# Patient Record
Sex: Female | Born: 1965
Health system: Southern US, Community
[De-identification: ages and names within clinical notes are randomized; demographics above are authoritative.]

## PROBLEM LIST (undated history)

## (undated) DIAGNOSIS — K219 Gastro-esophageal reflux disease without esophagitis: Secondary | ICD-10-CM

## (undated) DIAGNOSIS — F419 Anxiety disorder, unspecified: Secondary | ICD-10-CM

## (undated) DIAGNOSIS — I10 Essential (primary) hypertension: Secondary | ICD-10-CM

## (undated) DIAGNOSIS — K589 Irritable bowel syndrome without diarrhea: Secondary | ICD-10-CM

## (undated) DIAGNOSIS — T7840XA Allergy, unspecified, initial encounter: Secondary | ICD-10-CM

## (undated) DIAGNOSIS — J449 Chronic obstructive pulmonary disease, unspecified: Secondary | ICD-10-CM

## (undated) DIAGNOSIS — M810 Age-related osteoporosis without current pathological fracture: Secondary | ICD-10-CM

## (undated) DIAGNOSIS — E039 Hypothyroidism, unspecified: Secondary | ICD-10-CM

## (undated) DIAGNOSIS — F17200 Nicotine dependence, unspecified, uncomplicated: Secondary | ICD-10-CM

## (undated) DIAGNOSIS — E785 Hyperlipidemia, unspecified: Secondary | ICD-10-CM

## (undated) DIAGNOSIS — R55 Syncope and collapse: Secondary | ICD-10-CM

## (undated) DIAGNOSIS — Z87442 Personal history of urinary calculi: Secondary | ICD-10-CM

## (undated) DIAGNOSIS — I2510 Atherosclerotic heart disease of native coronary artery without angina pectoris: Secondary | ICD-10-CM

## (undated) DIAGNOSIS — N189 Chronic kidney disease, unspecified: Secondary | ICD-10-CM

## (undated) DIAGNOSIS — R002 Palpitations: Secondary | ICD-10-CM

## (undated) DIAGNOSIS — M1990 Unspecified osteoarthritis, unspecified site: Secondary | ICD-10-CM

## (undated) DIAGNOSIS — E559 Vitamin D deficiency, unspecified: Secondary | ICD-10-CM

## (undated) DIAGNOSIS — Z8249 Family history of ischemic heart disease and other diseases of the circulatory system: Secondary | ICD-10-CM

## (undated) HISTORY — DX: Family history of ischemic heart disease and other diseases of the circulatory system: Z82.49

## (undated) HISTORY — DX: Hyperlipidemia, unspecified: E78.5

## (undated) HISTORY — DX: Atherosclerotic heart disease of native coronary artery without angina pectoris: I25.10

## (undated) HISTORY — DX: Gastro-esophageal reflux disease without esophagitis: K21.9

## (undated) HISTORY — DX: Allergy, unspecified, initial encounter: T78.40XA

## (undated) HISTORY — DX: Age-related osteoporosis without current pathological fracture: M81.0

## (undated) HISTORY — DX: Nicotine dependence, unspecified, uncomplicated: F17.200

## (undated) HISTORY — DX: Palpitations: R00.2

## (undated) HISTORY — DX: Irritable bowel syndrome, unspecified: K58.9

## (undated) HISTORY — DX: Essential (primary) hypertension: I10

## (undated) HISTORY — DX: Chronic kidney disease, unspecified: N18.9

## (undated) HISTORY — DX: Syncope and collapse: R55

## (undated) HISTORY — PX: WRIST SURGERY: SHX841

## (undated) HISTORY — DX: Vitamin D deficiency, unspecified: E55.9

## (undated) HISTORY — DX: Hypothyroidism, unspecified: E03.9

---

## 2002-05-21 ENCOUNTER — Encounter: Payer: Self-pay | Admitting: Emergency Medicine

## 2002-05-21 ENCOUNTER — Inpatient Hospital Stay (HOSPITAL_COMMUNITY): Admission: EM | Admit: 2002-05-21 | Discharge: 2002-05-22 | Payer: Self-pay | Admitting: Emergency Medicine

## 2004-10-18 ENCOUNTER — Other Ambulatory Visit: Admission: RE | Admit: 2004-10-18 | Discharge: 2004-10-18 | Payer: Self-pay | Admitting: Obstetrics and Gynecology

## 2005-04-27 ENCOUNTER — Inpatient Hospital Stay (HOSPITAL_COMMUNITY): Admission: AD | Admit: 2005-04-27 | Discharge: 2005-04-29 | Payer: Self-pay | Admitting: Obstetrics and Gynecology

## 2005-05-30 ENCOUNTER — Other Ambulatory Visit: Admission: RE | Admit: 2005-05-30 | Discharge: 2005-05-30 | Payer: Self-pay | Admitting: Obstetrics and Gynecology

## 2005-12-13 ENCOUNTER — Ambulatory Visit: Payer: Self-pay | Admitting: Family Medicine

## 2006-02-04 ENCOUNTER — Ambulatory Visit: Payer: Self-pay | Admitting: Family Medicine

## 2006-02-26 ENCOUNTER — Ambulatory Visit: Payer: Self-pay | Admitting: Family Medicine

## 2006-07-01 DIAGNOSIS — E039 Hypothyroidism, unspecified: Secondary | ICD-10-CM | POA: Insufficient documentation

## 2006-07-01 DIAGNOSIS — F419 Anxiety disorder, unspecified: Secondary | ICD-10-CM | POA: Insufficient documentation

## 2006-08-12 ENCOUNTER — Ambulatory Visit: Payer: Self-pay | Admitting: Family Medicine

## 2006-08-19 ENCOUNTER — Telehealth (INDEPENDENT_AMBULATORY_CARE_PROVIDER_SITE_OTHER): Payer: Self-pay | Admitting: *Deleted

## 2006-09-05 ENCOUNTER — Telehealth: Payer: Self-pay | Admitting: Family Medicine

## 2006-09-18 ENCOUNTER — Telehealth: Payer: Self-pay | Admitting: Family Medicine

## 2006-10-24 ENCOUNTER — Ambulatory Visit: Payer: Self-pay | Admitting: Family Medicine

## 2006-11-28 ENCOUNTER — Ambulatory Visit: Payer: Self-pay | Admitting: Family Medicine

## 2006-11-28 DIAGNOSIS — J310 Chronic rhinitis: Secondary | ICD-10-CM | POA: Insufficient documentation

## 2006-12-04 ENCOUNTER — Encounter: Payer: Self-pay | Admitting: Family Medicine

## 2006-12-08 LAB — CONVERTED CEMR LAB: TSH: 0.46 microintl units/mL (ref 0.350–5.50)

## 2006-12-09 ENCOUNTER — Telehealth (INDEPENDENT_AMBULATORY_CARE_PROVIDER_SITE_OTHER): Payer: Self-pay | Admitting: *Deleted

## 2007-04-08 ENCOUNTER — Ambulatory Visit: Payer: Self-pay | Admitting: Family Medicine

## 2007-04-08 DIAGNOSIS — Z87898 Personal history of other specified conditions: Secondary | ICD-10-CM | POA: Insufficient documentation

## 2007-04-08 DIAGNOSIS — R002 Palpitations: Secondary | ICD-10-CM

## 2007-05-13 ENCOUNTER — Ambulatory Visit: Payer: Self-pay | Admitting: Family Medicine

## 2007-06-05 ENCOUNTER — Encounter: Payer: Self-pay | Admitting: Family Medicine

## 2007-06-05 LAB — CONVERTED CEMR LAB
ALT: <8 units/L (ref 0–35)
AST: 10 units/L (ref 0–37)
Albumin: 4 g/dL (ref 3.5–5.2)
Alkaline Phosphatase: 54 units/L (ref 39–117)
BUN: 16 mg/dL (ref 6–23)
CO2: 18 meq/L — ABNORMAL LOW (ref 19–32)
Calcium: 8.8 mg/dL (ref 8.4–10.5)
Chloride: 107 meq/L (ref 96–112)
Cholesterol: 199 mg/dL (ref 0–200)
Creatinine, Ser: 0.66 mg/dL (ref 0.40–1.20)
Glucose, Bld: 94 mg/dL (ref 70–99)
HDL: 59 mg/dL (ref >39)
LDL Cholesterol: 100 mg/dL — ABNORMAL HIGH (ref 0–99)
Potassium: 4.1 meq/L (ref 3.5–5.3)
Sodium: 141 meq/L (ref 135–145)
TSH: 0.58 microintl units/mL (ref 0.350–5.50)
Total Bilirubin: 0.3 mg/dL (ref 0.3–1.2)
Total CHOL/HDL Ratio: 3.4
Total Protein: 7.1 g/dL (ref 6.0–8.3)
Triglycerides: 199 mg/dL — ABNORMAL HIGH (ref <150)
VLDL: 40 mg/dL (ref 0–40)

## 2007-06-08 ENCOUNTER — Encounter: Payer: Self-pay | Admitting: Family Medicine

## 2007-06-11 ENCOUNTER — Ambulatory Visit: Payer: Self-pay | Admitting: Family Medicine

## 2007-08-28 ENCOUNTER — Ambulatory Visit: Payer: Self-pay | Admitting: Family Medicine

## 2007-09-24 HISTORY — PX: CORONARY ANGIOGRAM: SHX5786

## 2007-10-29 ENCOUNTER — Ambulatory Visit: Payer: Self-pay | Admitting: Family Medicine

## 2007-10-29 DIAGNOSIS — R5383 Other fatigue: Secondary | ICD-10-CM

## 2007-10-29 DIAGNOSIS — J309 Allergic rhinitis, unspecified: Secondary | ICD-10-CM | POA: Insufficient documentation

## 2007-10-29 DIAGNOSIS — R0609 Other forms of dyspnea: Secondary | ICD-10-CM | POA: Insufficient documentation

## 2007-10-29 DIAGNOSIS — R079 Chest pain, unspecified: Secondary | ICD-10-CM | POA: Insufficient documentation

## 2007-10-29 DIAGNOSIS — R5381 Other malaise: Secondary | ICD-10-CM | POA: Insufficient documentation

## 2007-10-29 DIAGNOSIS — R0989 Other specified symptoms and signs involving the circulatory and respiratory systems: Secondary | ICD-10-CM | POA: Insufficient documentation

## 2007-10-30 ENCOUNTER — Encounter: Payer: Self-pay | Admitting: Family Medicine

## 2007-10-30 LAB — CONVERTED CEMR LAB
ALT: 10 units/L (ref 0–35)
AST: 14 units/L (ref 0–37)
Albumin: 4.1 g/dL (ref 3.5–5.2)
Alkaline Phosphatase: 57 units/L (ref 39–117)
BUN: 19 mg/dL (ref 6–23)
Basophils Absolute: 0 10*3/uL (ref 0.0–0.1)
Basophils Relative: 0 % (ref 0–1)
CO2: 24 meq/L (ref 19–32)
Calcium: 9.3 mg/dL (ref 8.4–10.5)
Chloride: 106 meq/L (ref 96–112)
Creatinine, Ser: 0.75 mg/dL (ref 0.40–1.20)
Direct LDL: 148 mg/dL — ABNORMAL HIGH
Eosinophils Absolute: 0.1 10*3/uL (ref 0.0–0.7)
Eosinophils Relative: 1 % (ref 0–5)
Glucose, Bld: 97 mg/dL (ref 70–99)
HCT: 42.6 % (ref 36.0–46.0)
Hemoglobin: 13.8 g/dL (ref 12.0–15.0)
Lymphocytes Relative: 30 % (ref 12–46)
Lymphs Abs: 2.6 10*3/uL (ref 0.7–4.0)
MCHC: 32.4 g/dL (ref 30.0–36.0)
MCV: 94.9 fL (ref 78.0–100.0)
Monocytes Absolute: 0.5 10*3/uL (ref 0.1–1.0)
Monocytes Relative: 6 % (ref 3–12)
Neutro Abs: 5.4 10*3/uL (ref 1.7–7.7)
Neutrophils Relative %: 63 % (ref 43–77)
Platelets: 312 10*3/uL (ref 150–400)
Potassium: 3.8 meq/L (ref 3.5–5.3)
RBC: 4.49 M/uL (ref 3.87–5.11)
RDW: 13.7 % (ref 11.5–15.5)
Sodium: 141 meq/L (ref 135–145)
TSH: 1.572 microintl units/mL (ref 0.350–5.50)
Total Bilirubin: 0.3 mg/dL (ref 0.3–1.2)
Total Protein: 7.3 g/dL (ref 6.0–8.3)
WBC: 8.5 10*3/uL (ref 4.0–10.5)

## 2007-11-02 ENCOUNTER — Telehealth (INDEPENDENT_AMBULATORY_CARE_PROVIDER_SITE_OTHER): Payer: Self-pay | Admitting: *Deleted

## 2007-11-17 ENCOUNTER — Encounter: Payer: Self-pay | Admitting: Family Medicine

## 2007-11-17 ENCOUNTER — Ambulatory Visit: Payer: Self-pay

## 2007-11-17 ENCOUNTER — Ambulatory Visit: Payer: Self-pay | Admitting: Cardiology

## 2007-11-17 LAB — CONVERTED CEMR LAB
BUN: 12 mg/dL (ref 6–23)
Basophils Absolute: 0 10*3/uL (ref 0.0–0.1)
Basophils Relative: 0.5 % (ref 0.0–1.0)
CO2: 27 meq/L (ref 19–32)
Calcium: 9.3 mg/dL (ref 8.4–10.5)
Chloride: 102 meq/L (ref 96–112)
Creatinine, Ser: 0.5 mg/dL (ref 0.4–1.2)
Eosinophils Absolute: 0.1 10*3/uL (ref 0.0–0.6)
Eosinophils Relative: 0.9 % (ref 0.0–5.0)
GFR calc Af Amer: 174 mL/min
GFR calc non Af Amer: 144 mL/min
Glucose, Bld: 87 mg/dL (ref 70–99)
HCT: 40.1 % (ref 36.0–46.0)
Hemoglobin: 13.7 g/dL (ref 12.0–15.0)
INR: 0.9 (ref 0.8–1.0)
Lymphocytes Relative: 29.3 % (ref 12.0–46.0)
MCHC: 34.2 g/dL (ref 30.0–36.0)
MCV: 93 fL (ref 78.0–100.0)
Monocytes Absolute: 0.5 10*3/uL (ref 0.2–0.7)
Monocytes Relative: 5.9 % (ref 3.0–11.0)
Neutro Abs: 6 10*3/uL (ref 1.4–7.7)
Neutrophils Relative %: 63.4 % (ref 43.0–77.0)
Platelets: 276 10*3/uL (ref 150–400)
Potassium: 3.7 meq/L (ref 3.5–5.1)
Prothrombin Time: 11.4 s (ref 10.9–13.3)
RBC: 4.32 M/uL (ref 3.87–5.11)
RDW: 12.9 % (ref 11.5–14.6)
Sodium: 137 meq/L (ref 135–145)
WBC: 9.3 10*3/uL (ref 4.5–10.5)
aPTT: 27.3 s (ref 21.7–29.8)

## 2007-11-19 ENCOUNTER — Encounter: Admission: RE | Admit: 2007-11-19 | Discharge: 2007-11-19 | Payer: Self-pay | Admitting: Cardiology

## 2007-11-19 IMAGING — CR DG CHEST 2V
2 series · 2 of 2 positions shown · non-contrast
Comparison: none

CLINICAL DATA: Shortness of breath, chest pain.
 CHEST X-RAY:
 Two views of the chest show the lungs to be clear.  The heart is within normal limits in size. No bony abnormality is seen.

[view not recorded (1 of 2)]
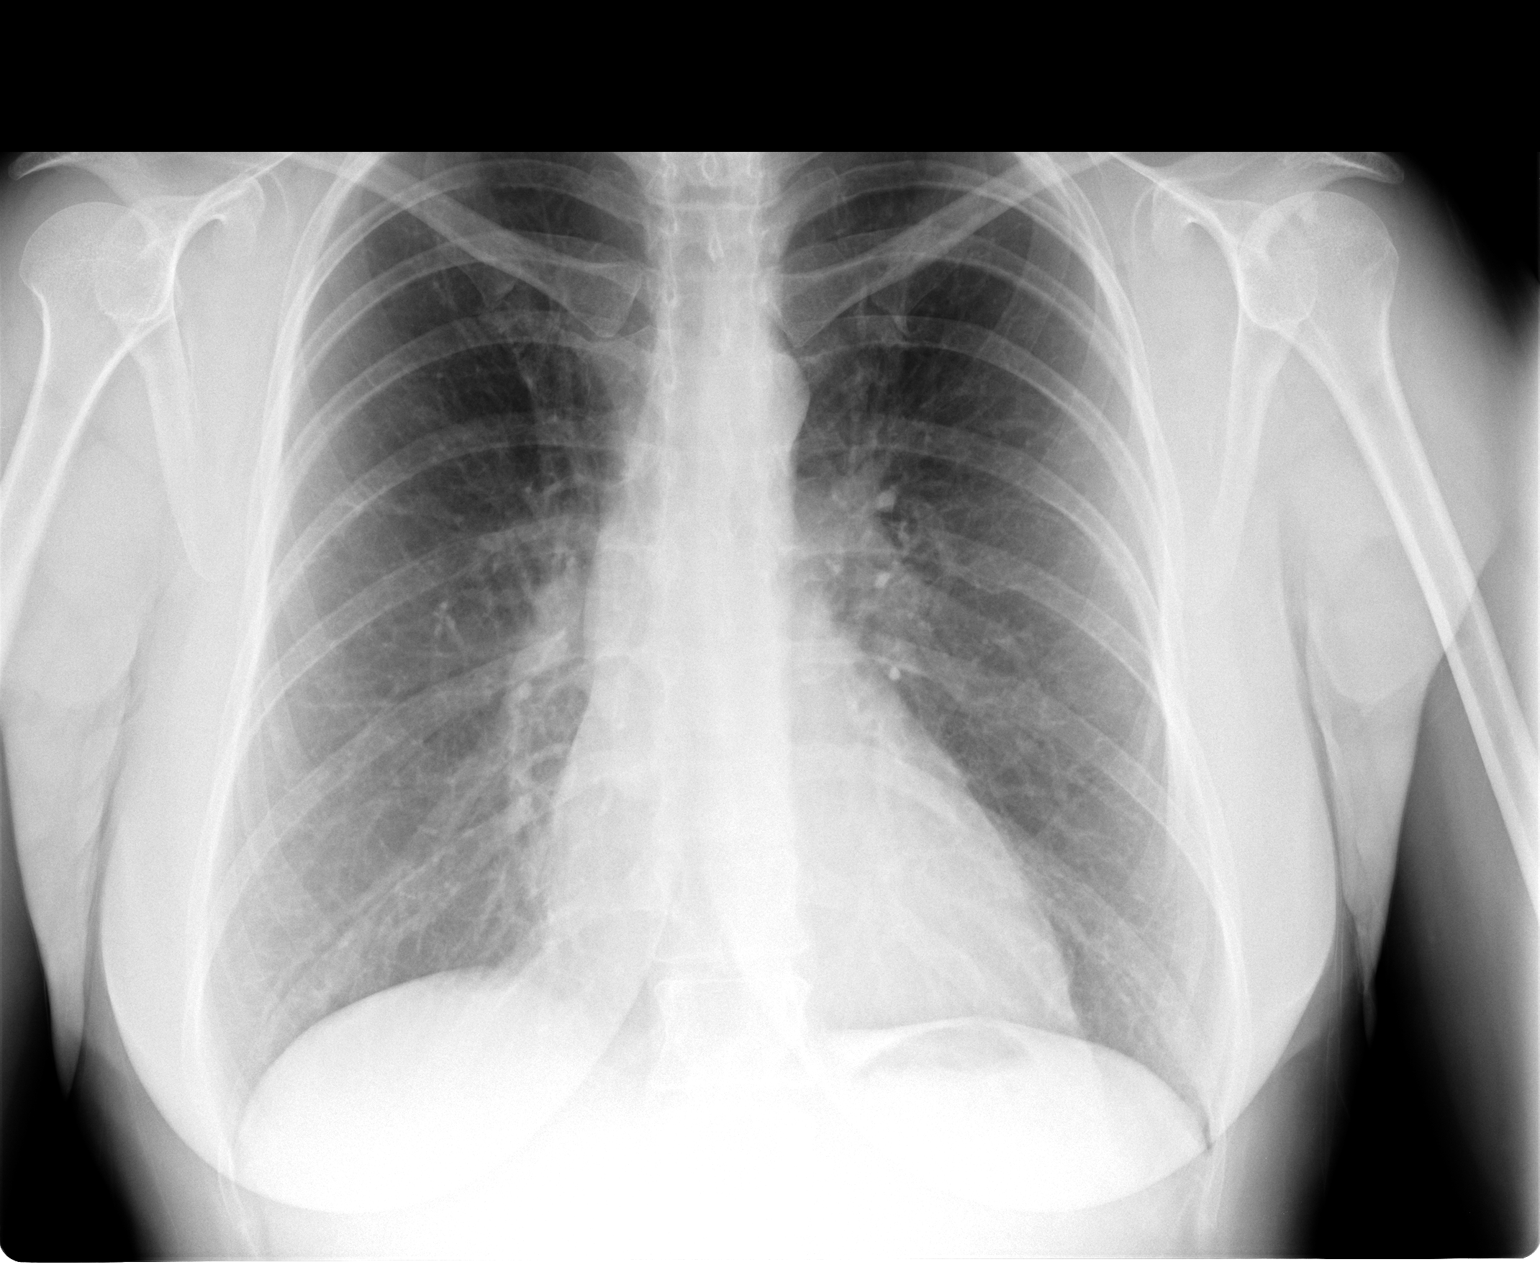

[view not recorded (2 of 2)]
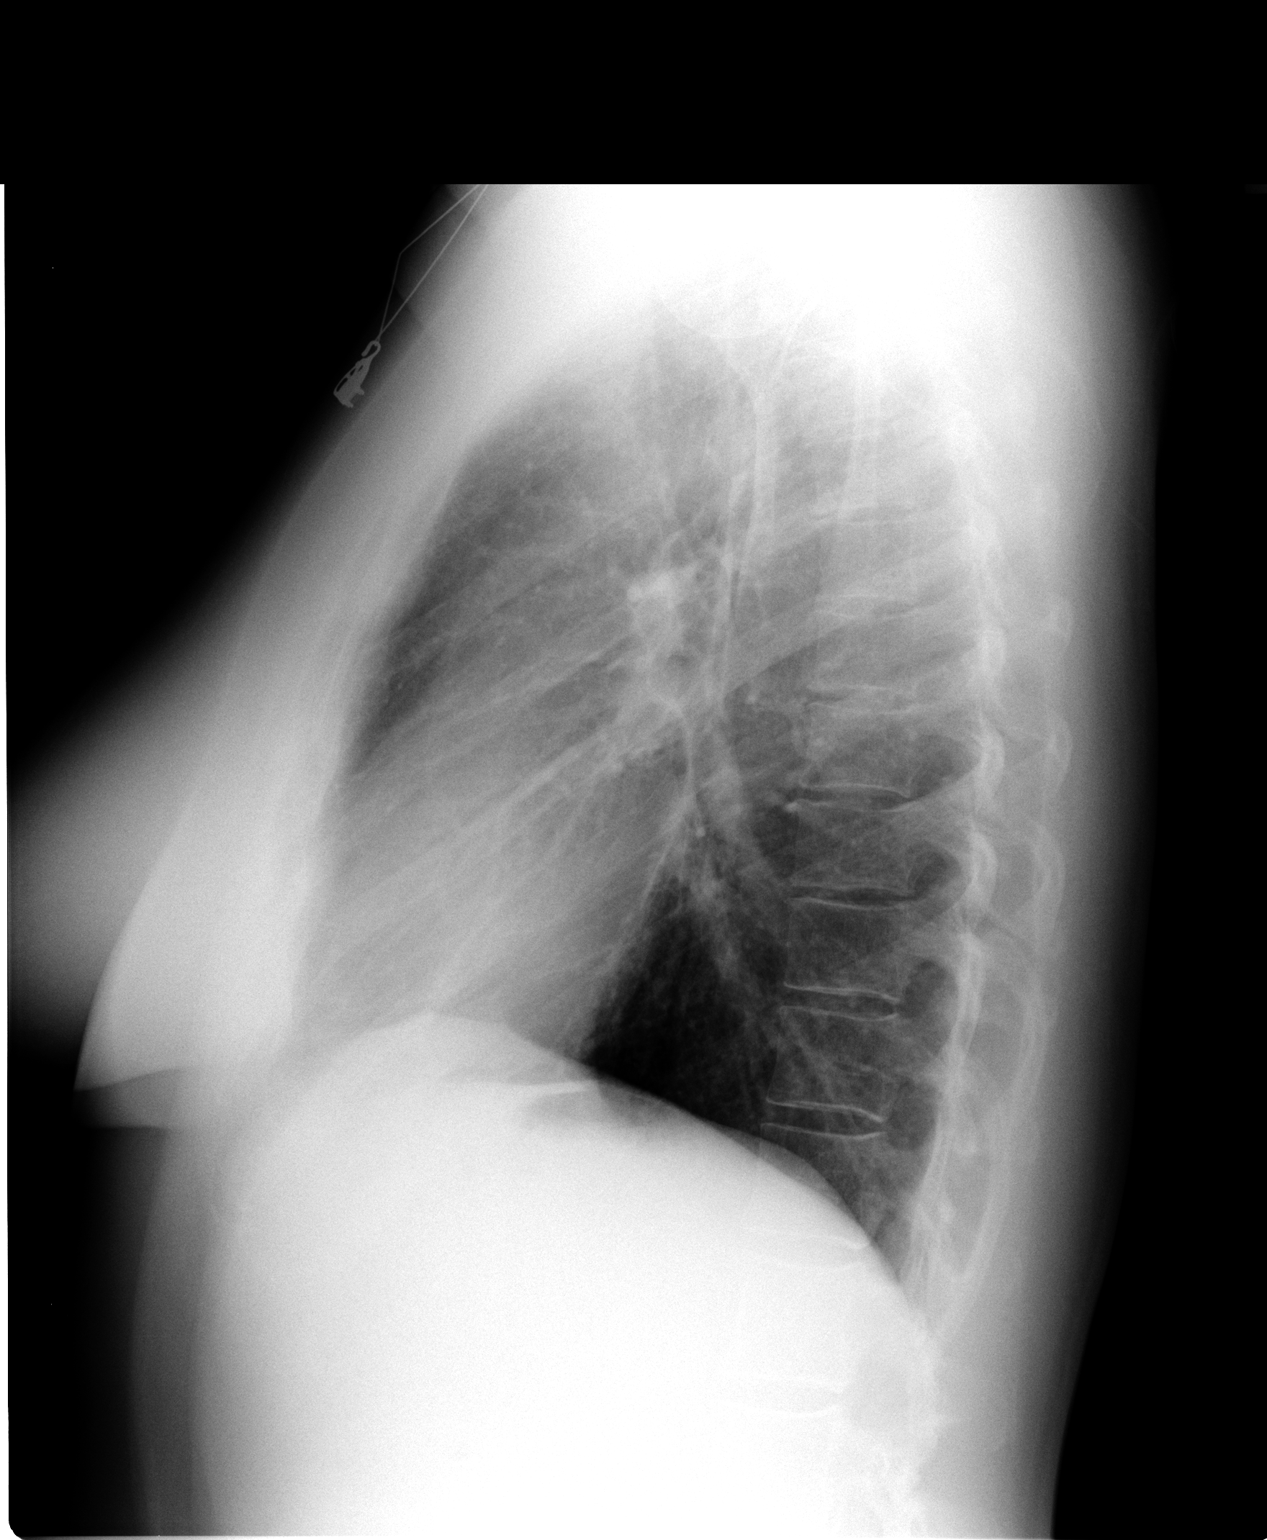

[2 of 2 positions shown; findings below may reference images not displayed]

IMPRESSION: No active lung disease.

## 2007-11-27 ENCOUNTER — Ambulatory Visit: Payer: Self-pay | Admitting: Internal Medicine

## 2007-11-27 ENCOUNTER — Inpatient Hospital Stay (HOSPITAL_BASED_OUTPATIENT_CLINIC_OR_DEPARTMENT_OTHER): Admission: RE | Admit: 2007-11-27 | Discharge: 2007-11-27 | Payer: Self-pay | Admitting: Internal Medicine

## 2007-12-04 ENCOUNTER — Encounter: Payer: Self-pay | Admitting: Family Medicine

## 2007-12-14 ENCOUNTER — Ambulatory Visit: Payer: Self-pay

## 2007-12-14 ENCOUNTER — Ambulatory Visit: Payer: Self-pay | Admitting: Cardiology

## 2007-12-24 ENCOUNTER — Ambulatory Visit: Payer: Self-pay | Admitting: Internal Medicine

## 2008-01-19 ENCOUNTER — Ambulatory Visit: Payer: Self-pay | Admitting: Cardiology

## 2008-08-03 DIAGNOSIS — F329 Major depressive disorder, single episode, unspecified: Secondary | ICD-10-CM | POA: Insufficient documentation

## 2008-08-03 DIAGNOSIS — F3289 Other specified depressive episodes: Secondary | ICD-10-CM | POA: Insufficient documentation

## 2010-10-21 LAB — CONVERTED CEMR LAB: Hemoglobin: 13.7 g/dL

## 2011-02-05 NOTE — Assessment & Plan Note (Signed)
[facility]                            CARDIOLOGY OFFICE NOTE   Mary Escobar, Mary Escobar                       MRN:          [id number]  DATE:12/14/2007                            DOB:          Jan 22, 1966    PRIMARY CARE PHYSICIAN:  Seymour Bars, D.O.   REASON FOR VISIT:  Follow up cardiac catheterization.   HISTORY OF PRESENT ILLNESS:  I saw Mary Escobar back in late February  following an abnormal standard exercise treadmill test.  She was  referred for this given dyspnea on exertion.  I discussed the  possibility that this could be false positive result, although given  her risk factors including tobacco use and LDL cholesterol of 148, we  also reviewed proceeding on to a diagnostic cardiac catheterization to  clearly understand the coronary anatomy.  This was performed on March 6  by Dr. Gala Romney.  Left and right heart catheterization was performed.  She had no obstructive coronary atherosclerosis with a normal left  ventricular ejection fraction of 65% and no significant mitral  regurgitation.  She also had normal right heart pressures with no  obvious intracardiac shunt.  She was noted to have a brief run of  supraventricular tachycardia during the right heart catheterization when  the catheter was actually in the right atrium.  In retrospect, however,  the patient states that she has been feeling palpitations similar to  this even prior to her procedure.  Dr. Gala Romney recommended considering  an event recorder and also further pulmonary testing.  We discussed this  today. Her electrocardiogram is stable showing sinus rhythm at 71 beats  per minute.   ALLERGIES:  SULFA DRUGS.   PRESENT MEDICATIONS:  1. Synthroid 100 mcg p.o. daily.  2. Zoloft 100 mg p.o. daily.  3. Oral contraceptive pills.  4. Multivitamin.  5. Omega 3 supplements.   REVIEW OF SYSTEMS:  As described in History of Present Illness,  otherwise negative.   PHYSICAL  EXAMINATION:  VITAL SIGNS:  Blood pressure 136/84,  heart rate  71. Weight is 176 pounds.  GENERAL:  The patient is comfortable and in no acute distress.  HEENT:  Conjunctivae and lids normal.  Pharynx clear.  NECK:  Supple.  No elevated jugular venous pressure, no loud bruits, no  thyromegaly is noted.  LUNGS:  Clear, no labored breathing.  CARDIAC:  Exam reveals a regular rate and rhythm.  No murmur or gallop.  ABDOMEN:  Soft, nontender.  Normoactive bowel sounds.  EXTREMITIES:  Show no pitting edema. Distal pulses are 2+.  SKIN:  Warm and dry.  MUSCULOSKELETAL:  No kyphosis noted.  NEUROPSYCHIATRIC:  The patient is alert and oriented x3.  Affect is  appropriate.   IMPRESSION AND RECOMMENDATIONS:  Dyspnea on exertion.  Coronary  angiography was reassuring showing no obstructive stenoses, normal  ejection fraction, no mitral regurgitation, and also normal right heart  pressures.  Given her history of tobacco use, we will arrange for  pulmonary function tests to exclude significant obstructive lung  disease.  In addition to this, we will arrange an event recorder given  her reported history of palpitations and brief run of supraventricular  tachycardia noted during cardiac catheterization with a catheter in the  right atrium.  I suspect this may well have been a nonspecific,  coincidental finding.  I will plan to bring her back to the office  within the next month for review.     Jonelle Sidle, MD  Electronically Signed    SGM/MedQ  DD: 12/14/2007  DT: 12/14/2007  Job #: [id number]   cc:   Seymour Bars, D.O.

## 2011-02-05 NOTE — Procedures (Signed)
[facility]                              EXERCISE Zada Girt   Escobar, Mary                       MRN:          [id number]  DATE:11/17/2007                            DOB:          March 30, 1966    REFERRING PHYSICIAN:  Seymour Bars, D.O.   INDICATIONS FOR PROCEDURE:  Dyspnea on exertion.   RESTING DATA:  Blood pressure 131/84, heart rate 71. A 12 lead  electrocardiogram showed sinus rhythm with non-specific STT wave  changes.   STRESS DATA:  The patient was exercised on a standard Bruce protocol for  6 minutes, achieving a maximum work load of 7 mets. Peak heart rate was  160 beats per minute, which was 89% of the maximum predicted heart rate.  The peak blood pressure was 182/100. The patient had dyspnea on exertion  but no chest pain. Approximately 1 mm ST segment depression was noted in  leads 2, 3, AVF and V4 through V6 towards peak exercise, although these  became non-diagnostic in less than 1 minute in recovery. She had no  significant arrhythmias.   CONCLUSION:  Abnormal exercise treadmill test by standard criteria at a  maximum work load of 7 mets. There was a hypertensive response to  exercise. No significant arrhythmias are noted. Results reported to Dr.  Cathey Endow by phone and the patient was seen in consultation following her  test.     Jonelle Sidle, MD  Electronically Signed    SGM/MedQ  DD: 11/17/2007  DT: 11/18/2007  Job #: [id number]   cc:   Seymour Bars, D.O.

## 2011-02-05 NOTE — Letter (Signed)
November 17, 2007    Seymour Bars, D.O.  [facility].  [address]  [place], [place] [address]   RE:  SHAKYIA, Mary Escobar  MRN:  [id number]  /  DOB:  25-Aug-1966   Dear Dr. Cathey Endow:   Thank you for your referral of Ms. Mareno.  As you know, she is a  pleasant 45 year old woman with a history of shortness of breath and  fatigue with activity.  She has not had any frank exertional chest pain  but has generally noted a decrease in stamina.  She admits that she has  not been exercising regularly, caring for a 15-1/2-year-old at home.  He  she has no personal history of hypertension or diabetes mellitus.  She  does have some family history of cardiovascular disease on her father's  side.  In addition she smokes one half-pack per day tobacco and has done  so for approximately 25 years.  You referred earlier today for an  exercise treadmill test which was abnormal by standard criteria.  The  patient exercised for 6 minutes achieving a maximum work load of 7 METS,  and had no chest pain.  She had shortness of breath but this did not  limit her activity to that point.  She manifested abnormal  electrocardiographic changes most prominent near peak with approximately  1 mm of ST-segment depression in leads II, III and aVF and V4-V6.  These  changes became nondiagnostic, however in less than 1 minute in recovery  and she was also noted to have a hypertensive response to exercise at  182/100.  No arrhythmias were noted.  I discussed these results with the  patient today and, given her symptom complex with abnormal screening  testing, we discussed the risks and benefits of proceeding onto a  diagnostic cardiac catheterization to clearly outline her coronary  anatomy.  After reviewing this she is in agreement to proceed and this  study will be arranged as an outpatient.   ALLERGIES:  SULFA DRUGS.   Present medications include oral contraceptive, Synthroid;and Zoloft  (doses not certain).   PAST MEDICAL HISTORY:  As outlined above.  The patient states that she  fractured her right wrist approximately 4 years ago.  Otherwise no  longstanding history of hypertension, although her blood pressure was  elevated with exercise today.  The patient's lipid status was recently  evaluated with findings of an LDL cholesterol of 148.  TSH was normal at  1.57.  No other major surgeries.   SOCIAL HISTORY:  Finds the patient is engaged.  She has a 35-1/2-year-old  at home.  She works as an Location manager.  She has a half-pack per  day tobacco use history of 25 years.  Reports a remote history of  recreational drug use, 7 or 8 years ago.  No alcohol use.  No regular  exercise at this time.   FAMILY HISTORY:  Significant for reported cardiovascular disease in the  patient's father's side.  States her mother and father are both living  in their late 57s to early 15s with no major cardiac problems.  She also  has a sister age 22 with no major cardiac problems.   REVIEW OF SYSTEMS:  Outlined above.  The patient reports problems with  seasonal allergies.  She reports problems with irritable bowel  syndrome also hypothyroidism and anxiety.   EXAMINATION:  The patient weighs 170 pounds.  Blood pressure 131/84 at  rest, heart rate 71.  The  patient is comfortable in no acute distress.  HEENT:  Conjunctiva is normal.  Pharynx is clear.  Neck is supple.  No elevated was pressure no loud bruits.  Lungs are clear without labored breathing.  CARDIAC:  Exam was a regular rate and rhythm.  No significant murmur or  gallop.  ABDOMEN:  Soft, nontender, normoactive bowel sounds.  EXTREMITIES:  No pitting edema.  Distal pulses 2+.  SKIN:  Warm and dry.  MUSCULOSKELETAL:  No kyphosis noted.  Neuropsychiatric the patient is alert x3.  Affect is appropriate to  situation.   Resting electrocardiogram shows sinus rhythm with nonspecific ST-T wave  changes.   IMPRESSION/RECOMMENDATIONS:  Ms. Noblett  is a pleasant 45 year old woman  with dyspnea on exertion and in general decreased stamina in the setting  of decreased regular exercise, longstanding tobacco use, and LDL  cholesterol of 148.  She was referred earlier today for a screening  exercise treadmill test which was abnormal based on electrocardiographic  criteria in the setting of exercise-induced hypertension.  She had no  chest pain associated with this and her ST-segment changes became  nondiagnostic within 1 minute of recovery.  It is possible that these  reflect false positive abnormalities, although given her symptom complex  and cardiac risk factors, further testing is warranted.  We discussed  proceeding on to a  diagnostic cardiac catheterization to clearly assess the coronary  anatomy and after reviewing the risks and benefits of this, she is in  agreement to proceed.  Baseline labs will be obtained as well as a chest  x-ray and this will be scheduled in our outpatient cardiac  catheterization lab.  In the meanwhile, I have asked her to start taking  an aspirin daily.  Further plans to follow.    Sincerely,      Jonelle Sidle, MD  Electronically Signed    SGM/MedQ  DD: 11/17/2007  DT: 11/18/2007  Job #: [id number]

## 2011-02-05 NOTE — Cardiovascular Report (Signed)
NAMELATERICA, MATARAZZO                ACCOUNT NO.:  [id number]   MEDICAL RECORD NO.:  [id number]          PATIENT TYPE:  OIB   LOCATION:  1965                         FACILITY:  [facility]   PHYSICIAN:  Bevelyn Buckles. Bensimhon, MDDATE OF BIRTH:  [birth year]   DATE OF PROCEDURE:  11/27/2007  DATE OF DISCHARGE:  11/27/2007                            CARDIAC CATHETERIZATION   REFERRING CARDIOLOGIST:  Dr. Simona Huh.   PRIMARY CARE PHYSICIAN:  Dr. Seymour Bars in [place].   PATIENT IDENTIFICATION:  Ms. Tewksbury is a 41-year woman with a history of  ongoing tobacco use.  She denies hypertension, hyperlipidemia.  She has  been having progressive severe dyspnea.  She underwent a treadmill test  which showed decreased exercise tolerance.  There were ST-T wave  abnormalities suggestive of ischemia and exercise-induced hypertension  as well with a blood pressure of 182/100.  She followed up with Dr.  Diona Browner and the decision was made to proceed with catheterization to  further evaluate her dyspnea.  This was done in the outpatient  laboratory.   PROCEDURES PERFORMED:  1. Right heart catheterization with oximetry run.  2. Left heart catheterization.  3. Left ventriculogram.  4. Selective coronary angiography.   DESCRIPTION OF THE PROCEDURE:  The risks and indications of the  catheterization were explained.  Consent was signed and placed on the  chart.  A 4-French arterial sheath was placed in the right femoral  artery using a modified Seldinger technique.  Standard catheters  including a JL-4, a 3DRC and angled pigtail were used for the procedure.  All catheter exchanges were made over a wire.  There are no apparent  complications.  A 7-French venous sheath was placed in the right femoral  vein using a modified Seldinger technique.  A standard Swan-Ganz  catheter was used for the right heart catheterization.  No apparent  complications.   Central aortic pressure 123/73 with a mean of 95.  LV  pressure 124/12.  There is no aortic stenosis.  Right atrial pressure mean of 2, RV  pressure 29/1.  PA pressure 25/7  with a mean of 15.  Pulmonary  capillary wedge pressure was mean of 6.  There was no aortic stenosis or  mitral stenosis.   Oximetry run:  High SVC saturations on room air 66% and 68%.  Low SVC  was 69%.  Right atrial saturation was 73%.  PA saturation was 74-77%.   CORONARY ANATOMY:  Left main was normal.   LAD was a long vessel coursing to the apex.  It gave off a large  diagonal and small diagonal.  There is a 20% ostial stenosis.  Otherwise  normal.   Left circumflex gave off a tiny OM-1, a large OM-2, and a small OM-3.  There was a 30% stenosis in the mid AV groove circumflex.   Right coronary artery was a moderate-sized dominant vessel that gave off  a large branching RV branch.  It was angiographically normal.   Left ventriculogram done in the RAO position showed an EF of 65% with no  regional wall motion abnormalities, no significant mitral regurgitation.  On panning down over the abdominal aorta after the ventriculogram, there  is no evidence of aneurysmal dilatation though the aorta was not well  opacified.   ASSESSMENT:  1. Mild nonobstructive coronary artery disease as described above.  2. Normal left ventricular function.  3. Normal right-sided pressures without obvious intracardiac shunt.  4. Brief run of supraventricular tachycardia during the right heart      catheterization with catheter in the right atrium.  This was      relatively asymptomatic.   PLAN/DISCUSSION:  I suspect her dyspnea is more related to her lung  disease and smoking and perhaps some deconditioning.  She did have a  brief run of SVT during the catheterization which was relatively  asymptomatic.  At this point I think the best plan would be to proceed  with pulmonary function tests or cardiopulmonary exercise testing.  May  also consider putting a Holter monitor on her to  make sure that her SVT  is not more prevalent.  I will discuss with Dr. Diona Browner.      Bevelyn Buckles. Bensimhon, MD  Electronically Signed     DRB/MEDQ  D:  11/27/2007  T:  11/28/2007  Job:  [id number]   cc:   Seymour Bars, D.O.

## 2011-02-05 NOTE — Assessment & Plan Note (Signed)
[facility]                            CARDIOLOGY OFFICE NOTE   Mary Escobar, Mary Escobar                       MRN:          [id number]  DATE:01/19/2008                            DOB:          03-Aug-1966    PRIMARY CARE PHYSICIAN:  Seymour Bars, D.O.   REASON FOR VISIT:  Scheduled followup.   HISTORY OF PRESENT ILLNESS:  Mary Escobar comes back in today to review  some recent studies.  She wore a cardiac monitor December 14, 2007 through  December 16, 2007 and was ultimately told that her insurance did not cover  this.  During her time with the monitor, she had sinus rhythm with no  significant arrhythmias or for that matter any significant ectopic  beats.  In addition, we referred her for pulmonary function tests which  essentially showed normal spirometry with normal lung volumes and mildly  reduced diffusion capacity with a trivial small airway response to  bronchodilators which is most likely not clinically significant.  I  think based on this, she has had a very reassuring evaluation and I  would not anticipate any further cardiac studies.  Today, we talked  about diet and exercise.  I mentioned to her looking into the [facility] Diet and also trying to get on a more regular exercise regimen.   ALLERGIES:  SULFA DRUGS.   CURRENT MEDICATIONS:  1. Synthroid 100 mcg p.o. daily.  2. Zoloft 100 mg p.o. daily.  3. Oral contraceptives.  4. Fish oil supplements.  5. Vitamin D and potassium supplements.   REVIEW OF SYSTEMS:  As described in the history of present illness.   PHYSICAL EXAMINATION:  VITAL SIGNS:  Blood pressure is 128/86, heart  rate is 67, weight 179 pounds.  The patient not otherwise examined  today.   IMPRESSION/RECOMMENDATIONS:  History of dyspnea on exertion.  The  patient has had a very reassuring evaluation including no obstructive  coronary artery disease at catheterization, normal ejection fraction, no  mitral regurgitation, normal  right heart pressures with subsequent  reassuring pulmonary function tests.  During her 3 days with a cardiac  monitor, she had no significant arrhythmias and is not reporting any  major progressive palpitations.  I suspect that the brief run of  supraventricular tachycardia noted during right heart catheterization  was more related to catheter placement.  I do not think any further  monitoring is indicated at this time.  We  discussed diet, exercise and she will plan to continue to follow up with  Dr. Cathey Endow for her thyroid and other health maintenance concerns.     Mary Sidle, MD  Electronically Signed    SGM/MedQ  DD: 01/19/2008  DT: 01/19/2008  Job #: [id number]   cc:   Seymour Bars, D.O.
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
## 2011-06-17 LAB — POCT I-STAT 3, VENOUS BLOOD GAS (G3P V)
Acid-base deficit: 6 — ABNORMAL HIGH
Bicarbonate: 23.3
Bicarbonate: 23.5
Bicarbonate: 23.6
Bicarbonate: 23.8
O2 Saturation: 73
O2 Saturation: 77
Operator id: 141321
Operator id: 194801
Operator id: 194801
Operator id: 194801
TCO2: 25
TCO2: 25
pCO2, Ven: 33.7 — ABNORMAL LOW
pCO2, Ven: 39.7 — ABNORMAL LOW
pCO2, Ven: 40 — ABNORMAL LOW
pCO2, Ven: 40 — ABNORMAL LOW
pCO2, Ven: 40.3 — ABNORMAL LOW
pCO2, Ven: 40.7 — ABNORMAL LOW
pH, Ven: 7.37 — ABNORMAL HIGH
pH, Ven: 7.37 — ABNORMAL HIGH
pH, Ven: 7.379 — ABNORMAL HIGH
pO2, Ven: 35
pO2, Ven: 36
pO2, Ven: 37
pO2, Ven: 39

## 2011-06-17 LAB — POCT I-STAT 3, ART BLOOD GAS (G3+)
Acid-base deficit: 1
Bicarbonate: 23.5
Operator id: 194801
TCO2: 25
pH, Arterial: 7.419 — ABNORMAL HIGH

## 2011-09-05 ENCOUNTER — Ambulatory Visit: Payer: Self-pay | Admitting: Cardiology

## 2011-10-24 DIAGNOSIS — J0100 Acute maxillary sinusitis, unspecified: Secondary | ICD-10-CM | POA: Insufficient documentation

## 2011-10-24 DIAGNOSIS — F17200 Nicotine dependence, unspecified, uncomplicated: Secondary | ICD-10-CM | POA: Insufficient documentation

## 2015-10-16 ENCOUNTER — Telehealth: Payer: Self-pay | Admitting: Cardiology

## 2015-10-16 NOTE — Telephone Encounter (Signed)
Received records from [facility] for appointment on 11/21/15 with Dr Stanford Breed.  Records given to [facility] for Dr Jacalyn Lefevre schedule on 11/21/15. lp

## 2015-11-20 NOTE — Progress Notes (Signed)
   Referring: Juanda Chance NP  HPI: 50 year old female for evaluation of palpitations. Cardiac catheterization was performed in 2009 following an abnormal exercise treadmill. She had mild nonobstructive coronary disease. Ejection fraction 65%. Normal right-sided pressures. She did have a brief run of SVT during a right heart catheterization. She has not been seen since 2009. Laboratories January 2017 showed a normal T4. TSH was reduced. Patient describes occasional palpitations. These occur when lying on her left side. They're short-lived. She also has chest pain. It is in the low substernal area and described as a pressure radiating to her back. No associated symptoms. Not pleuritic, exertional, related to food. Lasts 1-2 hours and resolved spontaneously. She has had this intermittently for 10 years. She does not have exertional chest pain but does have dyspnea on exertion. Because of the above we were asked to evaluate.  Current Outpatient Prescriptions  Medication Sig Dispense Refill  . Potassium 99 MG TABS Take 1 tablet by mouth daily.    Marland Kitchen amoxicillin (AMOXIL) 500 MG capsule Take 500 mg by mouth 2 (two) times daily.  0  . JOLIVETTE 0.35 MG tablet Take 1 tablet by mouth daily.  4  . levothyroxine (SYNTHROID, LEVOTHROID) 88 MCG tablet Take 1 tablet by mouth daily.  3   No current facility-administered medications for this visit.    Allergies  Allergen Reactions  . Citalopram Palpitations  . Ciprofloxacin Nausea And Vomiting  . Sulfonamide Derivatives     REACTION: unknown rx  . Sulfa Antibiotics Rash     Past Medical History  Diagnosis Date  . Hypothyroid   . IBS (irritable bowel syndrome)     Past Surgical History  Procedure Laterality Date  . Wrist surgery      Social History   Social History  . Marital Status: Single    Spouse Name: N/A  . Number of Children: 1  . Years of Education: N/A   Occupational History  . Not on file.   Social History Main Topics  .  Smoking status: Current Every Day Smoker  . Smokeless tobacco: Not on file  . Alcohol Use: No  . Drug Use: No  . Sexual Activity: Not on file   Other Topics Concern  . Not on file   Social History Narrative  . No narrative on file    Family History  Problem Relation Age of Onset  . CAD Father     MI at age 33    ROS: Fatigue but no fevers or chills, productive cough, hemoptysis, dysphasia, odynophagia, melena, hematochezia, dysuria, hematuria, rash, seizure activity, orthopnea, PND, pedal edema, claudication. Remaining systems are negative.  Physical Exam:   Blood pressure 136/88, pulse 68, height 5\' 7"  (1.702 m), weight 178 lb 6.4 oz (80.922 kg).  General:  Well developed/well nourished in NAD Skin warm/dry Patient not depressed No peripheral clubbing Back-normal HEENT-normal/normal eyelids Neck supple/normal carotid upstroke bilaterally; no bruits; no JVD; no thyromegaly chest - CTA/ normal expansion CV - RRR/normal S1 and S2; no murmurs, rubs or gallops;  PMI nondisplaced Abdomen -NT/ND, no HSM, no mass, + bowel sounds, no bruit 2+ femoral pulses, no bruits Ext-no edema, chords, 2+ DP Neuro-grossly nonfocal  ECG Normal sinus rhythm at a rate of 68. No ST changes.

## 2015-11-21 ENCOUNTER — Encounter: Payer: Self-pay | Admitting: Cardiology

## 2015-11-21 ENCOUNTER — Ambulatory Visit (INDEPENDENT_AMBULATORY_CARE_PROVIDER_SITE_OTHER): Payer: 59 | Admitting: Cardiology

## 2015-11-21 VITALS — BP 136/88 | HR 68 | Ht 67.0 in | Wt 178.4 lb

## 2015-11-21 DIAGNOSIS — Z720 Tobacco use: Secondary | ICD-10-CM

## 2015-11-21 DIAGNOSIS — R079 Chest pain, unspecified: Secondary | ICD-10-CM | POA: Insufficient documentation

## 2015-11-21 DIAGNOSIS — F17200 Nicotine dependence, unspecified, uncomplicated: Secondary | ICD-10-CM | POA: Insufficient documentation

## 2015-11-21 DIAGNOSIS — R072 Precordial pain: Secondary | ICD-10-CM | POA: Diagnosis not present

## 2015-11-21 DIAGNOSIS — F17210 Nicotine dependence, cigarettes, uncomplicated: Secondary | ICD-10-CM | POA: Insufficient documentation

## 2015-11-21 DIAGNOSIS — R0789 Other chest pain: Secondary | ICD-10-CM | POA: Insufficient documentation

## 2015-11-21 MED ORDER — METOPROLOL SUCCINATE ER 25 MG PO TB24: 25.0000 mg | ORAL_TABLET | Freq: Every day | ORAL | Status: DC

## 2015-11-21 NOTE — Patient Instructions (Signed)
Medication Instructions:   START METOPROLOL SUCC ER 25 MG ONCE DAILY AT BEDTIME  Testing/Procedures:  Your physician has requested that you have a stress echocardiogram. For further information please visit [email or web address]. Please follow instruction sheet as given.    Follow-Up:  Your physician recommends that you schedule a follow-up appointment in: 8-12 WEEKS WITH DR Stanford Breed   Exercise Stress Echocardiogram An exercise stress echocardiogram is a heart (cardiac) test used to check the function of your heart. This test may also be called an exercise stress echocardiography or stress echo. This stress test will check how well your heart muscle and valves are working and determine if your heart muscle is getting enough blood. You will exercise on a treadmill to naturally increase or stress the functioning of your heart.  An echocardiogram uses sound waves (ultrasound) to produce an image of your heart. If your heart does not work normally, it may indicate coronary artery disease with poor coronary blood supply. The coronary arteries are the arteries that bring blood and oxygen to your heart. LET [facility] CARE PROVIDER KNOW ABOUT:  Any allergies you have.  All medicines you are taking, including vitamins, herbs, eye drops, creams, and over-the-counter medicines.  Previous problems you or members of your family have had with the use of anesthetics.  Any blood disorders you have.  Previous surgeries you have had.  Medical conditions you have.  Possibility of pregnancy, if this applies. RISKS AND COMPLICATIONS Generally, this is a safe procedure. However, as with any procedure, complications can occur. Possible complications can include:  You develop pain or pressure in the following areas:  Chest.  Jaw or neck.  Between your shoulder blades.  Radiating down your left arm.  Dizziness or lightheadedness.  Shortness of breath.  Increased or irregular  heartbeat.  Nausea or vomiting.  Heart attack (rare). BEFORE THE PROCEDURE  Avoid all forms of caffeine for 24 hours before your test or as directed by your health care provider. This includes coffee, tea (even decaffeinated tea), caffeinated sodas, chocolate, cocoa, and certain pain medicines.  Follow your health care provider's instructions regarding eating and drinking before the test.  Take your medicines as directed at regular times with water unless instructed otherwise. Exceptions may include:  If you have diabetes, ask how you are to take your insulin or pills. It is common to adjust insulin dosing the morning of the test.  If you are taking beta-blocker medicines, it is important to talk to your health care provider about these medicines well before the date of your test. Taking beta-blocker medicines may interfere with the test. In some cases, these medicines need to be changed or stopped 24 hours or more before the test.  If you wear a nitroglycerin patch, it may need to be removed prior to the test. Ask your health care provider if the patch should be removed before the test.  If you use an inhaler for any breathing condition, bring it with you to the test.  If you are an outpatient, bring a snack so you can eat right after the stress phase of the test.  Do not smoke for 4 hours prior to the test or as directed by your health care provider.  Wear loose-fitting clothes and comfortable shoes for the test. This test involves walking on a treadmill. PROCEDURE   Multiple electrodes will be put on your chest. If needed, small areas of your chest may be shaved to get better contact with the electrodes. Once  the electrodes are attached to your body, multiple wires will be attached to the electrodes, and your heart rate will be monitored.  You will have an echocardiogram done at rest.  To produce this image of your heart, gel is applied to your chest, and a wand-like tool  (transducer) is moved over the chest. The transducer sends the sound waves through the chest to create the moving images of your heart.  You may need an IV to receive a medication that improves the quality of the pictures.  You will then walk on a treadmill. The treadmill will be started at a slow pace. The treadmill speed and incline will gradually be increased to raise your heart rate.  At the peak of exercise, the treadmill will be stopped. You will lie down immediately on a bed so that a second echocardiogram can be done to visualize your heart's motion with exercise.  The test usually takes 30-60 minutes to complete. AFTER THE PROCEDURE  Your heart rate and blood pressure will be monitored after the test.  You may return to your normal schedule, including diet, activities, and medicines, unless your health care provider tells you otherwise.   This information is not intended to replace advice given to you by your health care provider. Make sure you discuss any questions you have with your health care provider.   Document Released: 09/13/2004 Document Revised: 09/14/2013 Document Reviewed: 05/17/2013 Elsevier Interactive Patient Education [employer].

## 2015-11-21 NOTE — Assessment & Plan Note (Signed)
Symptoms are atypical. I will arrange a stress echocardiogram for risk stratification. Note we will use echocardiographic imaging as she had a false positive exercise treadmill previously.

## 2015-11-21 NOTE — Assessment & Plan Note (Signed)
Patient has had recurrent brief palpitations. She had the same symptoms when SVT was noted on telemetry at previous catheterization. We will add Toprol 25 mg daily at bedtime. If symptoms persist despite beta blockade we will consider a monitor.

## 2015-11-21 NOTE — Assessment & Plan Note (Signed)
Patient counseled on discontinuing. 

## 2015-12-18 ENCOUNTER — Other Ambulatory Visit (HOSPITAL_COMMUNITY): Payer: 59

## 2016-01-12 NOTE — Progress Notes (Signed)
      HPI: Fu palpitations. Cardiac catheterization was performed in 2009 following an abnormal exercise treadmill. She had mild nonobstructive coronary disease. Ejection fraction 65%. Normal right-sided pressures. She did have a brief run of SVT during a right heart catheterization. Laboratories January 2017 showed a normal T4. TSH was reduced. Stress echocardiogram ordered at last office visit but not performed. Toprol added at last office visit. Since last seen She denies dyspnea, recurrent chest pain or syncope. Palpitations have improved on low-dose Toprol.  Current Outpatient Prescriptions  Medication Sig Dispense Refill  . JOLIVETTE 0.35 MG tablet Take 1 tablet by mouth daily.  4  . levothyroxine (SYNTHROID, LEVOTHROID) 88 MCG tablet Take 1 tablet by mouth daily.  3  . metoprolol succinate (TOPROL XL) 25 MG 24 hr tablet Take 1 tablet (25 mg total) by mouth at bedtime. 30 tablet 12  . Potassium 99 MG TABS Take 1 tablet by mouth daily.     No current facility-administered medications for this visit.     Past Medical History  Diagnosis Date  . Hypothyroid   . IBS (irritable bowel syndrome)     Past Surgical History  Procedure Laterality Date  . Wrist surgery      Social History   Social History  . Marital Status: Single    Spouse Name: N/A  . Number of Children: 1  . Years of Education: N/A   Occupational History  . Not on file.   Social History Main Topics  . Smoking status: Current Every Day Smoker  . Smokeless tobacco: Not on file  . Alcohol Use: No  . Drug Use: No  . Sexual Activity: Not on file   Other Topics Concern  . Not on file   Social History Narrative    Family History  Problem Relation Age of Onset  . CAD Father     MI at age 54    ROS: no fevers or chills, productive cough, hemoptysis, dysphasia, odynophagia, melena, hematochezia, dysuria, hematuria, rash, seizure activity, orthopnea, PND, pedal edema, claudication. Remaining systems are  negative.  Physical Exam: Well-developed well-nourished in no acute distress.  Skin is warm and dry.  HEENT is normal.  Neck is supple.  Chest is clear to auscultation with normal expansion.  Cardiovascular exam is regular rate and rhythm.  Abdominal exam nontender or distended. No masses palpated. Extremities show no edema. neuro grossly intact

## 2016-01-15 ENCOUNTER — Ambulatory Visit (INDEPENDENT_AMBULATORY_CARE_PROVIDER_SITE_OTHER): Payer: 59 | Admitting: Cardiology

## 2016-01-15 ENCOUNTER — Encounter: Payer: Self-pay | Admitting: Cardiology

## 2016-01-15 VITALS — BP 122/80 | HR 54 | Ht 67.0 in | Wt 180.2 lb

## 2016-01-15 DIAGNOSIS — R002 Palpitations: Secondary | ICD-10-CM | POA: Diagnosis not present

## 2016-01-15 DIAGNOSIS — Z720 Tobacco use: Secondary | ICD-10-CM

## 2016-01-15 DIAGNOSIS — R072 Precordial pain: Secondary | ICD-10-CM | POA: Diagnosis not present

## 2016-01-15 NOTE — Patient Instructions (Signed)
Your physician wants you to follow-up in: ONE YEAR WITH DR CRENSHAW You will receive a reminder letter in the mail two months in advance. If you don't receive a letter, please call our office to schedule the follow-up appointment.   If you need a refill on your cardiac medications before your next appointment, please call your pharmacy.  

## 2016-01-15 NOTE — Assessment & Plan Note (Signed)
Patient counseled on discontinuing. 

## 2016-01-15 NOTE — Assessment & Plan Note (Signed)
No recurrent symptoms. She would like to defer stress test at this point which I think is reasonable. We will consider repeating stress echocardiogram if she has recurrent chest pain in the future.

## 2016-01-15 NOTE — Assessment & Plan Note (Signed)
Improved. Continue low-dose Toprol.

## 2016-12-18 ENCOUNTER — Other Ambulatory Visit: Payer: Self-pay | Admitting: Cardiology

## 2016-12-18 DIAGNOSIS — R072 Precordial pain: Secondary | ICD-10-CM

## 2016-12-18 NOTE — Telephone Encounter (Signed)
REFILL 

## 2017-01-18 DIAGNOSIS — H66002 Acute suppurative otitis media without spontaneous rupture of ear drum, left ear: Secondary | ICD-10-CM | POA: Diagnosis not present

## 2017-03-20 DIAGNOSIS — Z01419 Encounter for gynecological examination (general) (routine) without abnormal findings: Secondary | ICD-10-CM | POA: Diagnosis not present

## 2017-03-23 ENCOUNTER — Other Ambulatory Visit: Payer: Self-pay | Admitting: Cardiology

## 2017-03-23 DIAGNOSIS — R072 Precordial pain: Secondary | ICD-10-CM

## 2017-05-22 ENCOUNTER — Other Ambulatory Visit: Payer: Self-pay | Admitting: Cardiology

## 2017-05-22 DIAGNOSIS — R072 Precordial pain: Secondary | ICD-10-CM

## 2017-05-22 MED ORDER — METOPROLOL SUCCINATE ER 25 MG PO TB24: 25.0000 mg | ORAL_TABLET | Freq: Every day | ORAL | 0 refills | Status: DC

## 2017-05-22 NOTE — Telephone Encounter (Signed)
lmtcb

## 2017-05-22 NOTE — Telephone Encounter (Signed)
New message     *STAT* If patient is at the pharmacy, call can be transferred to refill team.   1. Which medications need to be refilled? (please list name of each medication and dose if known) metoprolol 25 mg  2. Which pharmacy/location (including street and city if local pharmacy) is medication to be sent to? [facility] in [place]  3. Do they need a 30 day or 90 day supply? 30 day

## 2017-05-22 NOTE — Telephone Encounter (Signed)
Why did you route to [place]?

## 2017-06-06 ENCOUNTER — Ambulatory Visit (INDEPENDENT_AMBULATORY_CARE_PROVIDER_SITE_OTHER): Payer: 59 | Admitting: Physician Assistant

## 2017-06-06 ENCOUNTER — Encounter: Payer: Self-pay | Admitting: Physician Assistant

## 2017-06-06 VITALS — BP 129/81 | HR 53 | Ht 67.0 in | Wt 174.2 lb

## 2017-06-06 DIAGNOSIS — R002 Palpitations: Secondary | ICD-10-CM

## 2017-06-06 DIAGNOSIS — E039 Hypothyroidism, unspecified: Secondary | ICD-10-CM | POA: Diagnosis not present

## 2017-06-06 DIAGNOSIS — R42 Dizziness and giddiness: Secondary | ICD-10-CM

## 2017-06-06 DIAGNOSIS — I2510 Atherosclerotic heart disease of native coronary artery without angina pectoris: Secondary | ICD-10-CM

## 2017-06-06 NOTE — Patient Instructions (Signed)
Medication Instructions:   none  Labwork:   none  Testing/Procedures:  Your physician has recommended that you wear an event monitor. Event monitors are medical devices that record the heart's electrical activity. Doctors most often [place] these monitors to diagnose arrhythmias. Arrhythmias are problems with the speed or rhythm of the heartbeat. The monitor is a small, portable device. You can wear one while you do your normal daily activities. This is usually used to diagnose what is causing palpitations/syncope (passing out).   Follow-Up:  6 months with Dr. Stanford Breed  If you need a refill on your cardiac medications before your next appointment, please call your pharmacy.

## 2017-06-06 NOTE — Progress Notes (Signed)
Cardiology Office Note    Date:  06/06/2017   ID:  Mary Escobar, DOB 1966/04/07, MRN [id number]  PCP:  Juanda Chance, NP  Cardiologist:  Dr. Stanford Breed  Chief Complaint  Patient presents with  . Palpitations    "fluttering", "flushed feeling" at times    History of Present Illness:  Mary Escobar is a 51 y.o. female with PMH of hypothyroidism, mild nonobstructive CAD, h/o SVT, palpitation. Cardiac catheterization performed in 2009 following abnormal exercise treadmill showed mild nonobstructive CAD. EF 65%. Normal right-sided pressure. She did have brief run of SVT during the right heart cath. She has a history of palpitation improved on low-dose Toprol. Her last office visit with Dr. Stanford Breed was on 01/15/2016, she was doing well at the time.  Patient presents today for cardiology office visit. She has not had any chest pain since the last visit. However she has been noticing more palpitation at night. It is almost occurring multiple times throughout the week. She mainly notices when she is laying down at night especially when she lays on her right side. Additionally, she has been having occasional dizziness when she walks. I recommended a 30 day event monitor to further assess the palpitation and dizziness, however she says she is a single mother and is worried about the cost. I will run through with precertification first, if the cost is reasonable for the patient, and she will proceed. I want to hold off on adjusting the metoprolol succinate for now as I am not entirely sure what is causing her palpitation and whether her dizziness is related to occasional slow heart rate. Her baseline heart rate was 53 today. She does not have any significant LE edema, orthopnea or PND. She says her primary care provider has been managing her thyroid medication and keeping up with lab work. She did have 2 episodes of prolonged flushing sensation and palpitation when she is awake, however it occurred in the  summer of 2017, she is not sure if it was panic episodes.   Past Medical History:  Diagnosis Date  . Hypothyroid   . IBS (irritable bowel syndrome)   . mild nonobstructive CAD on cath 2009   . Palpitation     Past Surgical History:  Procedure Laterality Date  . WRIST SURGERY      Current Medications: Outpatient Medications Prior to Visit  Medication Sig Dispense Refill  . JOLIVETTE 0.35 MG tablet Take 1 tablet by mouth daily.  4  . levothyroxine (SYNTHROID, LEVOTHROID) 88 MCG tablet Take 1 tablet by mouth daily.  3  . metoprolol succinate (TOPROL-XL) 25 MG 24 hr tablet Take 1 tablet (25 mg total) by mouth at bedtime. 30 tablet 0  . Potassium 99 MG TABS Take 1 tablet by mouth daily.     No facility-administered medications prior to visit.      Allergies:   Citalopram; Ciprofloxacin; Sulfonamide derivatives; and Sulfa antibiotics   Social History   Social History  . Marital status: Single    Spouse name: N/A  . Number of children: 1  . Years of education: N/A   Social History Main Topics  . Smoking status: Current Every Day Smoker  . Smokeless tobacco: Never Used  . Alcohol use No  . Drug use: No  . Sexual activity: Not Asked   Other Topics Concern  . None   Social History Narrative  . None     Family History:  The patient's family history includes CAD in her father.  ROS:   Please see the history of present illness.    ROS All other systems reviewed and are negative.   PHYSICAL EXAM:   VS:  BP 129/81 (BP Location: Right Arm, Patient Position: Sitting)   Pulse (!) 53    GEN: Well nourished, well developed, in no acute distress  HEENT: normal  Neck: no JVD, carotid bruits, or masses Cardiac: RRR; no murmurs, rubs, or gallops,no edema  Respiratory:  clear to auscultation bilaterally, normal work of breathing GI: soft, nontender, nondistended, + BS MS: no deformity or atrophy  Skin: warm and dry, no rash Neuro:  Alert and Oriented x 3, Strength and  sensation are intact Psych: euthymic mood, full affect  Wt Readings from Last 3 Encounters:  01/15/16 180 lb 4 oz (81.8 kg)  11/21/15 178 lb 6.4 oz (80.9 kg)  10/29/07 175 lb (79.4 kg)      Studies/Labs Reviewed:   EKG:  EKG is ordered today.  The ekg ordered today demonstrates sinus bradycardia, heart rate 56, T wave inversion in V1 through V3. Previous EKG reviewed also had T-wave inversions in V1 through V2  Recent Labs: No results found for requested labs within last 8760 hours.   Lipid Panel    Component Value Date/Time   CHOL 199 06/05/2007 2012   TRIG 199 (H) 06/05/2007 2012   HDL 59 06/05/2007 2012   CHOLHDL 3.4 Ratio 06/05/2007 2012   VLDL 40 06/05/2007 2012   LDLCALC 100 (H) 06/05/2007 2012   LDLDIRECT 148 (H) 10/30/2007 0039    Additional studies/ records that were reviewed today include:   Cath 11/27/2007  CORONARY ANATOMY:  Left main was normal.   LAD was a long vessel coursing to the apex.  It gave off a large  diagonal and small diagonal.  There is a 20% ostial stenosis.  Otherwise  normal.   Left circumflex gave off a tiny OM-1, a large OM-2, and a small OM-3.  There was a 30% stenosis in the mid AV groove circumflex.   Right coronary artery was a moderate-sized dominant vessel that gave off  a large branching RV branch.  It was angiographically normal.   Left ventriculogram done in the RAO position showed an EF of 65% with no  regional wall motion abnormalities, no significant mitral regurgitation.   On panning down over the abdominal aorta after the ventriculogram, there  is no evidence of aneurysmal dilatation though the aorta was not well  opacified.   ASSESSMENT:  1. Mild nonobstructive coronary artery disease as described above.  2. Normal left ventricular function.  3. Normal right-sided pressures without obvious intracardiac shunt.  4. Brief run of supraventricular tachycardia during the right heart      catheterization with catheter  in the right atrium.  This was      relatively asymptomatic.    ASSESSMENT:    1. Palpitations   2. Hypothyroidism, unspecified type   3. Coronary artery disease involving native coronary artery of native heart without angina pectoris   4. Dizziness      PLAN:  In order of problems listed above:  1. Palpitation: Occurs more often when she is laying down on her right side. Does not occur with exertion. She is currently on Toprol-XL 25 mg daily. Baseline heart rate low 50s. It would be difficult to increase her Toprol-XL. In combination with her dizziness, I recommended a heart monitor, she is concerned about the cost. We will run the precertification first, if the cost  is too much, we may have to just observe the symptom for now. Fortunately, her dizziness does not symptom to occur at the same time as the palpitation. Both dizziness and palpitation only last seconds at a time.  2. Dizziness: Occurs more so when she walks. She says sometimes she has a transient episode of dizziness that last a fracture of the second. I'm not entirely sure if she is having significant bradycardic episode. Her heart rate today is 53. This would make the adjustment of Toprol-XL even more challenging. Again I recommended heart monitor before further adjustment of the medication.  3. Hypothyroidism: Managed by primary care provider  4. Minimal nonobstructive CAD: 20% LAD lesion and a 30% mid left circumflex lesion on previous cardiac catheterization in 2009. The cardiac cath was performed after a false positive stress test. If she were to have chest pain in the future, we'll consider stress echo instead.    Medication Adjustments/Labs and Tests Ordered: Current medicines are reviewed at length with the patient today.  Concerns regarding medicines are outlined above.  Medication changes, Labs and Tests ordered today are listed in the Patient Instructions below. Patient Instructions  Medication Instructions:    none  Labwork:   none  Testing/Procedures:  Your physician has recommended that you wear an event monitor. Event monitors are medical devices that record the heart's electrical activity. Doctors most often [place] these monitors to diagnose arrhythmias. Arrhythmias are problems with the speed or rhythm of the heartbeat. The monitor is a small, portable device. You can wear one while you do your normal daily activities. This is usually used to diagnose what is causing palpitations/syncope (passing out).   Follow-Up:  6 months with Dr. Stanford Breed  If you need a refill on your cardiac medications before your next appointment, please call your pharmacy.      Hilbert Corrigan, [place]  06/06/2017 10:05 AM    [facility] [facility] [address], [place], [place]  [address] Phone: [phone]; Fax: [phone]

## 2017-06-06 NOTE — Addendum Note (Signed)
Addended by: Theodore Demark on: 06/06/2017 10:18 AM   Modules accepted: Orders

## 2017-06-20 ENCOUNTER — Other Ambulatory Visit: Payer: Self-pay | Admitting: Cardiology

## 2017-06-20 DIAGNOSIS — R072 Precordial pain: Secondary | ICD-10-CM

## 2017-07-01 ENCOUNTER — Ambulatory Visit (INDEPENDENT_AMBULATORY_CARE_PROVIDER_SITE_OTHER): Payer: 59

## 2017-07-01 DIAGNOSIS — R002 Palpitations: Secondary | ICD-10-CM

## 2017-07-20 ENCOUNTER — Other Ambulatory Visit: Payer: Self-pay | Admitting: Cardiology

## 2017-07-20 DIAGNOSIS — R072 Precordial pain: Secondary | ICD-10-CM

## 2017-07-21 NOTE — Telephone Encounter (Signed)
REFILL 

## 2017-08-11 DIAGNOSIS — E039 Hypothyroidism, unspecified: Secondary | ICD-10-CM | POA: Diagnosis not present

## 2017-12-18 DIAGNOSIS — E039 Hypothyroidism, unspecified: Secondary | ICD-10-CM | POA: Diagnosis not present

## 2018-01-20 ENCOUNTER — Other Ambulatory Visit: Payer: Self-pay | Admitting: Cardiology

## 2018-01-20 DIAGNOSIS — R072 Precordial pain: Secondary | ICD-10-CM

## 2018-02-21 LAB — HM MAMMOGRAPHY

## 2018-03-20 DIAGNOSIS — Z01419 Encounter for gynecological examination (general) (routine) without abnormal findings: Secondary | ICD-10-CM | POA: Diagnosis not present

## 2018-03-20 DIAGNOSIS — Z6827 Body mass index (BMI) 27.0-27.9, adult: Secondary | ICD-10-CM | POA: Diagnosis not present

## 2018-03-20 DIAGNOSIS — Z1231 Encounter for screening mammogram for malignant neoplasm of breast: Secondary | ICD-10-CM | POA: Diagnosis not present

## 2018-03-23 LAB — HM MAMMOGRAPHY

## 2018-03-24 LAB — HM PAP SMEAR: HM Pap smear: NEGATIVE

## 2018-06-17 DIAGNOSIS — Z23 Encounter for immunization: Secondary | ICD-10-CM | POA: Diagnosis not present

## 2018-08-25 ENCOUNTER — Other Ambulatory Visit: Payer: Self-pay | Admitting: Cardiology

## 2018-08-25 DIAGNOSIS — R072 Precordial pain: Secondary | ICD-10-CM

## 2018-08-25 NOTE — Telephone Encounter (Signed)
Pt calling requesting a refill on metoprolol and would like for it to be sent to [facility] in [place]. This is Dr. Jacalyn Lefevre pt. Please address

## 2018-08-26 NOTE — Telephone Encounter (Signed)
PT NEEDS APPT FOR REFILLS

## 2018-08-27 MED ORDER — METOPROLOL SUCCINATE ER 25 MG PO TB24: 25.0000 mg | ORAL_TABLET | Freq: Every day | ORAL | 0 refills | Status: DC

## 2018-08-27 NOTE — Telephone Encounter (Signed)
TRIED TO CALL PT BACK-PHONE JUST KEEPS RINGING 7DAY RX SENT TO REQUESTED PHARMACY. WILL AWAIT CALL BACK TO SCHEDULE APPT FOR FURTHER REFILLS

## 2018-08-28 DIAGNOSIS — E039 Hypothyroidism, unspecified: Secondary | ICD-10-CM | POA: Diagnosis not present

## 2018-09-08 ENCOUNTER — Encounter: Payer: Self-pay | Admitting: Medical

## 2018-09-08 ENCOUNTER — Ambulatory Visit: Payer: 59 | Admitting: Medical

## 2018-09-08 VITALS — BP 110/78 | HR 52 | Temp 98.6°F | Ht 66.75 in | Wt 179.0 lb

## 2018-09-08 DIAGNOSIS — R5383 Other fatigue: Secondary | ICD-10-CM

## 2018-09-08 DIAGNOSIS — I889 Nonspecific lymphadenitis, unspecified: Secondary | ICD-10-CM | POA: Diagnosis not present

## 2018-09-08 DIAGNOSIS — R400 Somnolence: Secondary | ICD-10-CM | POA: Insufficient documentation

## 2018-09-08 DIAGNOSIS — F17200 Nicotine dependence, unspecified, uncomplicated: Secondary | ICD-10-CM

## 2018-09-08 DIAGNOSIS — F419 Anxiety disorder, unspecified: Secondary | ICD-10-CM

## 2018-09-08 DIAGNOSIS — R0683 Snoring: Secondary | ICD-10-CM

## 2018-09-08 DIAGNOSIS — E039 Hypothyroidism, unspecified: Secondary | ICD-10-CM | POA: Diagnosis not present

## 2018-09-08 DIAGNOSIS — Z8249 Family history of ischemic heart disease and other diseases of the circulatory system: Secondary | ICD-10-CM | POA: Insufficient documentation

## 2018-09-08 HISTORY — DX: Snoring: R06.83

## 2018-09-08 NOTE — Progress Notes (Signed)
Subjective: Chief Complaint  Patient presents with  . New Patient (Initial Visit)    establish care   . Fatigue  . Anxiety    possibly due to hormonal changes    Medical team: Gynecology, [facility], Dr. Stanford Breed   Here as a new patient.  Has been using OB/gyn for routine care.     Has hx/o thyroid disease, tachycardia, and on medication for both.  Here to establish care and has several concerns.  She gets flushed feeling in face at times, turns red at times in face if getting upset or stressed.   2 weeks ago had swollen gland on left neck, size of an egg.  Has stayed sore but swelling is much less.    Has hypothyroidism - had labs recently and levothyroxine was increased to 9mcg.    She reports likely menopausal.  Feels stressed at times, sometimes irritable.  This is new, has always been a laid back easy person.  Things can set her off easy.    Gets some heart fluttering from time to time.  doesn't sleep well.   Doesn't have problem falling asleep, but can awake often.  LMP  Unknown as she is on continue dose regiment currently.  She smokes 1/2 ppd.  Sees Dr. Stanford Breed cardiology for palpations.   He put her on beta blocker  sometimes feels heaviness in chest, but no frank dyspnea or SOB.    Regarding mood, not happy but not depressed.  Stresses about work, children, her father irritate her.  He doesn't always help with parenting. Fiance has cirrhosis of the liver due to alcohol.    Doesn't want to keep feeling tired.  Has been told she snores.  Not aware of witnessed apnea.   No prior sleep study.   Doesn't feeling rested.   Gets sleepy during the day.   Past Medical History:  Diagnosis Date  . Hypothyroid   . IBS (irritable bowel syndrome)   . mild nonobstructive CAD on cath 2009   . Palpitation    Current Outpatient Medications on File Prior to Visit  Medication Sig Dispense Refill  . JOLIVETTE 0.35 MG tablet Take 1 tablet by mouth  daily.  4  . levothyroxine (SYNTHROID, LEVOTHROID) 88 MCG tablet Take 1 tablet by mouth daily.  3  . metoprolol succinate (TOPROL-XL) 25 MG 24 hr tablet Take 1 tablet (25 mg total) by mouth daily. NEED OV. 7 tablet 0   No current facility-administered medications on file prior to visit.    ROS as in subjective    Objective: BP 110/78   Pulse (!) 52   Temp 98.6 F (37 C) (Oral)   Ht 5' 6.75" (1.695 m)   Wt 179 lb (81.2 kg)   SpO2 95%   BMI 28.25 kg/m   General appearance: alert, no distress, WD/WN, white female HEENT: normocephalic, sclerae anicteric, TMs pearly, nares patent, no discharge or erythema, pharynx normal Oral cavity: MMM, no lesions Neck: supple, left neck with mildly enlarged and tender node in submandibular area, othewrise no other lymphadenopathy, no thyromegaly, no masses Heart: RRR, normal S1, S2, no murmurs Lungs: CTA bilaterally, no wheezes, rhonchi, or rales Abdomen: +bs, soft, non tender, non distended, no masses, no hepatomegaly, no splenomegaly Pulses: 2+ symmetric, upper and lower extremities, normal cap refill Ext: no edema    Assessment: Encounter Diagnoses  Name Primary?  . Fatigue, unspecified type Yes  . Hypothyroidism, unspecified type   . Lymphadenitis   .  Anxiety   . Family history of premature CAD   . Smoker   . Snoring   . Daytime somnolence       Plan: We discussed her concerns and symptoms mainly fatigue and anxiety.  We discussed that the cause is multifactorial including likely perimenopausal symptoms, hypothyroidism, lack of routine exercise, possible other factors  PFT mildly abnormal.  She has long hx/o tobacco use from teenage years.   I recommend a chest x-ray.  I recommend she stop smoking.  Discussed ways to try to quit smoking.  She is not ready to quit.  She has symptoms suggestive of sleep apnea.  Discussed doing a sleep study.  She will call insurance and let me know if she is agreeable.  She has no recent labs  through gynecology, we requested lab results today.  If no recent labs, may have her come back for a baseline panel of labs including CBC, comprehensive metabolic, and sed rate.  Of note her fianc has a history of cirrhosis, but not sure about any prior hepatitis testing so consider hepatitis testing.  Hypothyroidism-continue current thyroid medicine, will be due for repeat lab in 1 month.  Lymphadenitis- await labs from gynecology but may need to do a CBC however it does sound like her symptoms have improved some   Zahniya was seen today for new patient (initial visit), fatigue and anxiety.  Diagnoses and all orders for this visit:  Fatigue, unspecified type  Hypothyroidism, unspecified type  Lymphadenitis  Anxiety  Family history of premature CAD  Smoker -     Cancel: Spirometry with graph; Future -     Cancel: Spirometry with graph; Future -     Spirometry with graph  Snoring -     Cancel: Spirometry with graph; Future -     Cancel: Spirometry with graph; Future -     Spirometry with graph  Daytime somnolence

## 2018-09-08 NOTE — Patient Instructions (Signed)
Encounter Diagnoses  Name Primary?  . Fatigue, unspecified type Yes  . Hypothyroidism, unspecified type   . Lymphadenitis   . Anxiety   . Family history of premature CAD   . Smoker   . Snoring   . Daytime somnolence    Your main concerns today are fatigue and anxiety.  This could be caused by multiple factors.  I suspect part of the reason you have the symptoms is that you are likely in menopausal state or perimenopausal, you have known hypothyroidism.  You may have other issues like sleep apnea  Recommendations today We checked your breathing test today to evaluate for asthma/COPD.     I recommend you have a chest x-ray given the fatigue and smoking history  I recommend baseline labs, but we are waiting to get lab results from your gynecologist first  You should probably have a sleep study, but we can defer this till after the first of the year.  I will check your insurance to see what your co-pay or deductible may be to have a sleep study  You are due for a colonoscopy or Cologuard colon cancer screening given that you are over 26 years old  [place]!  Exercise 150 minutes weekly or more      Sleep Apnea Sleep apnea is a condition that affects breathing. People with sleep apnea have moments during sleep when their breathing pauses briefly or gets shallow. Sleep apnea can cause these symptoms:  Trouble staying asleep.  Sleepiness or tiredness during the day.  Irritability.  Loud snoring.  Morning headaches.  Trouble concentrating.  Forgetting things.  Less interest in sex.  Being sleepy for no reason.  Mood swings.  Personality changes.  Depression.  Waking up a lot during the night to pee (urinate).  Dry mouth.  Sore throat.  Follow these instructions at home:  Make any changes in your routine that your doctor recommends.  Eat a healthy, well-balanced diet.  Take over-the-counter and prescription medicines only as told by your  doctor.  Avoid using alcohol, calming medicines (sedatives), and narcotic medicines.  Take steps to lose weight if you are overweight.  If you were given a machine (device) to use while you sleep, use it only as told by your doctor.  Do not use any tobacco products, such as cigarettes, chewing tobacco, and e-cigarettes. If you need help quitting, ask your doctor.  Keep all follow-up visits as told by your doctor. This is important. Contact a doctor if:  The machine that you were given to use during sleep is uncomfortable or does not seem to be working.  Your symptoms do not get better.  Your symptoms get worse. Get help right away if:  Your chest hurts.  You have trouble breathing in enough air (shortness of breath).  You have an uncomfortable feeling in your back, arms, or stomach.  You have trouble talking.  One side of your body feels weak.  A part of your face is hanging down (drooping). These symptoms may be an emergency. Do not wait to see if the symptoms will go away. Get medical help right away. Call your local emergency services (911 in the [place] Do not drive yourself to the hospital. This information is not intended to replace advice given to you by your health care provider. Make sure you discuss any questions you have with your health care provider. Document Released: 06/18/2008 Document Revised: 05/05/2016 Document Reviewed: 06/19/2015 Elsevier Interactive Patient Education  [employer].

## 2018-09-09 ENCOUNTER — Other Ambulatory Visit: Payer: Self-pay | Admitting: Medical

## 2018-09-09 ENCOUNTER — Telehealth: Payer: Self-pay | Admitting: Medical

## 2018-09-09 MED ORDER — VENLAFAXINE HCL ER 37.5 MG PO CP24: 37.5000 mg | ORAL_CAPSULE | Freq: Every day | ORAL | 2 refills | Status: DC

## 2018-09-09 NOTE — Telephone Encounter (Signed)
Please call  Patient called back concerning effexor rx, said it was discussed during her visit was she is confused if you were going to send in rx

## 2018-09-09 NOTE — Telephone Encounter (Signed)
Yes, med sent

## 2018-09-21 ENCOUNTER — Telehealth: Payer: Self-pay | Admitting: Medical

## 2018-09-21 NOTE — Telephone Encounter (Signed)
Received requested records from [facility], sending back for review.

## 2018-09-23 DIAGNOSIS — R5383 Other fatigue: Secondary | ICD-10-CM

## 2018-09-23 HISTORY — DX: Other fatigue: R53.83

## 2018-09-24 ENCOUNTER — Encounter: Payer: Self-pay | Admitting: Medical

## 2018-09-30 ENCOUNTER — Encounter: Payer: Self-pay | Admitting: Medical

## 2018-10-01 ENCOUNTER — Encounter: Payer: Self-pay | Admitting: Family Medicine

## 2018-10-01 ENCOUNTER — Ambulatory Visit: Payer: 59 | Admitting: Family Medicine

## 2018-10-01 VITALS — BP 122/80 | HR 74 | Temp 97.7°F | Wt 178.4 lb

## 2018-10-01 DIAGNOSIS — F17200 Nicotine dependence, unspecified, uncomplicated: Secondary | ICD-10-CM

## 2018-10-01 DIAGNOSIS — J329 Chronic sinusitis, unspecified: Secondary | ICD-10-CM | POA: Diagnosis not present

## 2018-10-01 DIAGNOSIS — F419 Anxiety disorder, unspecified: Secondary | ICD-10-CM

## 2018-10-01 DIAGNOSIS — J309 Allergic rhinitis, unspecified: Secondary | ICD-10-CM

## 2018-10-01 MED ORDER — AMOXICILLIN 875 MG PO TABS: 875.0000 mg | ORAL_TABLET | Freq: Two times a day (BID) | ORAL | 0 refills | Status: DC

## 2018-10-01 NOTE — Progress Notes (Signed)
   Subjective:    Patient ID: Mary Escobar, female    DOB: Nov 02, 1965, 53 y.o.   MRN: [id number]  HPI She complains of a 2-week history that started with headache followed by malaise, intermittently productive cough, myalgias with feeling of fullness in her ears and popping.  No fever, chills, earache or sore throat slightly productive cough.  She smokes 1/2 pack of cigarettes per day and started doing this due to stress at some her family members and church related issues.  She does have underlying allergies which are under fairly good control.   Review of Systems     Objective:   Physical Exam Alert and in no distress.  Nasal mucosa is normal.  No tenderness over sinuses.  Tympanic membranes and canals are normal. Pharyngeal area is normal. Neck is supple without adenopathy or thyromegaly. Cardiac exam shows a regular sinus rhythm without murmurs or gallops. Lungs are clear to auscultation.        Assessment & Plan:  Sinusitis, unspecified chronicity, unspecified location - Plan: amoxicillin (AMOXIL) 875 MG tablet  Anxiety  Smoker  Allergic rhinitis, unspecified seasonality, unspecified trigger Her systems are most consistent with sinusitis and otitis.  I will go ahead and treat her. Discussed smoking cessation with her as well as the stress and anxiety that she is under.  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  Encouraged her to use the serenity.  She seems to be getting involved in issues she can have no control over. Told her that we would work with her when she is ready to quit smoking again.

## 2018-10-01 NOTE — Patient Instructions (Signed)
Take all the antibiotic and if not totally back to normal when you finish call me Use the serenity prayer

## 2018-11-25 ENCOUNTER — Other Ambulatory Visit: Payer: Self-pay | Admitting: Cardiology

## 2018-11-25 DIAGNOSIS — R072 Precordial pain: Secondary | ICD-10-CM

## 2018-11-30 ENCOUNTER — Telehealth: Payer: Self-pay | Admitting: Medical

## 2018-11-30 NOTE — Telephone Encounter (Signed)
Pt called and is wanting to know if she can just come in for blood work to check her thyroid, states you was waiting on records from her OBGYN, or does she need to come in for a office visit, there was no orders in for lab work, pt can be reached at [phone]

## 2018-11-30 NOTE — Telephone Encounter (Signed)
Go ahead and get her in for med check follow-up from her first visit here.  We had discussed a lot of things at that visit.  If she has not had labs in recent months you could make this a physical visit if she is due for a physical  At the very least we are following up on thyroid

## 2018-12-01 NOTE — Telephone Encounter (Signed)
Pt is coming in for a fasting medcheck on Friday

## 2018-12-04 ENCOUNTER — Encounter: Payer: 59 | Admitting: Medical

## 2018-12-08 ENCOUNTER — Other Ambulatory Visit: Payer: Self-pay

## 2018-12-08 ENCOUNTER — Ambulatory Visit: Payer: 59 | Admitting: Medical

## 2018-12-08 ENCOUNTER — Encounter: Payer: Self-pay | Admitting: Medical

## 2018-12-08 VITALS — BP 138/80 | HR 68 | Temp 98.3°F | Resp 16 | Ht 67.0 in | Wt 182.8 lb

## 2018-12-08 DIAGNOSIS — G8929 Other chronic pain: Secondary | ICD-10-CM | POA: Insufficient documentation

## 2018-12-08 DIAGNOSIS — M25522 Pain in left elbow: Secondary | ICD-10-CM

## 2018-12-08 DIAGNOSIS — E039 Hypothyroidism, unspecified: Secondary | ICD-10-CM | POA: Diagnosis not present

## 2018-12-08 DIAGNOSIS — J309 Allergic rhinitis, unspecified: Secondary | ICD-10-CM

## 2018-12-08 DIAGNOSIS — R0683 Snoring: Secondary | ICD-10-CM | POA: Diagnosis not present

## 2018-12-08 DIAGNOSIS — F17200 Nicotine dependence, unspecified, uncomplicated: Secondary | ICD-10-CM

## 2018-12-08 DIAGNOSIS — R5383 Other fatigue: Secondary | ICD-10-CM | POA: Diagnosis not present

## 2018-12-08 DIAGNOSIS — E559 Vitamin D deficiency, unspecified: Secondary | ICD-10-CM | POA: Insufficient documentation

## 2018-12-08 DIAGNOSIS — M2550 Pain in unspecified joint: Secondary | ICD-10-CM | POA: Insufficient documentation

## 2018-12-08 DIAGNOSIS — Z8249 Family history of ischemic heart disease and other diseases of the circulatory system: Secondary | ICD-10-CM

## 2018-12-08 DIAGNOSIS — M549 Dorsalgia, unspecified: Secondary | ICD-10-CM

## 2018-12-08 DIAGNOSIS — F419 Anxiety disorder, unspecified: Secondary | ICD-10-CM

## 2018-12-08 DIAGNOSIS — R400 Somnolence: Secondary | ICD-10-CM

## 2018-12-08 DIAGNOSIS — R072 Precordial pain: Secondary | ICD-10-CM

## 2018-12-08 DIAGNOSIS — R454 Irritability and anger: Secondary | ICD-10-CM | POA: Insufficient documentation

## 2018-12-08 DIAGNOSIS — R002 Palpitations: Secondary | ICD-10-CM

## 2018-12-08 HISTORY — DX: Pain in unspecified joint: M25.50

## 2018-12-08 MED ORDER — METOPROLOL SUCCINATE ER 25 MG PO TB24: 25.0000 mg | ORAL_TABLET | Freq: Every day | ORAL | 3 refills | Status: DC

## 2018-12-08 MED ORDER — BUPROPION HCL ER (XL) 150 MG PO TB24: 150.0000 mg | ORAL_TABLET | Freq: Every day | ORAL | 2 refills | Status: DC

## 2018-12-08 NOTE — Progress Notes (Signed)
Subjective:     Patient ID: Mary Escobar, female   DOB: 25-Feb-1966, 52 y.o.   MRN: [id number]  HPI Chief Complaint  Patient presents with  . Med check    fasting med check out of metoprolol for 2 weeks   Here for f/u from her first visit 08/2018 with numerous concerns.    At her first visit she has numerous concerns including the following.  Has irritability, snappy at people at times, tired all the time.   We started Effexor last visit.    Tried the effexor but it makes her feel numb. She stopped it.    Tired all the time, doesn't have energy.    Been a smoker > 30 years.   Has a fairly common cough, but its intermittent, relates it to either allergies or smokers cough.  Taking allergy medication makes her feel more sleepy so she usual doesn't take it.   Does have some sinus congestion and post nasal drainage.    Gets some pains in left elbow and right knee, has chronic back pain, achy in general on a regular basis for months.   Sees gynecology in the next few months to discuss possible menopause.  Uses metoprolol daily for palpitations.    No hx/o sleep apnea, but does snore and has non restful sleep.     Past Medical History:  Diagnosis Date  . Hypothyroid   . IBS (irritable bowel syndrome)   . mild nonobstructive CAD on cath 2009   . Palpitation    Current Outpatient Medications on File Prior to Visit  Medication Sig Dispense Refill  . JOLIVETTE 0.35 MG tablet Take 1 tablet by mouth daily.  4  . levothyroxine (SYNTHROID, LEVOTHROID) 88 MCG tablet Take 1 tablet by mouth daily.  3   No current facility-administered medications on file prior to visit.    Review of Systems As in subjective     Objective:   Physical Exam BP 138/80   Pulse 68   Temp 98.3 F (36.8 C) (Oral)   Resp 16   Ht 5\' 7"  (1.702 m)   Wt 182 lb 12.8 oz (82.9 kg)   SpO2 96%   BMI 28.63 kg/m   General appearance: alert, no distress, WD/WN,  HEENT: normocephalic, sclerae anicteric, TMs  pearly, nares patent, no discharge or erythema, pharynx normal Oral cavity: MMM, no lesions Neck: supple, no lymphadenopathy, no thyromegaly, no masses Heart: RRR, normal S1, S2, no murmurs Lungs: CTA bilaterally, no wheezes, rhonchi, or rales Pulses: 2+ symmetric, upper and lower extremities, normal cap refill Left elbow tender over lateral epicondyle of elbow, pain with resisted supination, otherwise non tender arm, and no deformity Ext: no edema       Assessment:     Encounter Diagnoses  Name Primary?  . Hypothyroidism, unspecified type Yes  . Smoker   . Family history of premature CAD   . Snoring   . Palpitations   . Fatigue, unspecified type   . Daytime somnolence   . Anxiety   . Allergic rhinitis, unspecified seasonality, unspecified trigger   . Polyarthralgia   . Left elbow pain   . Chronic bilateral back pain, unspecified back location   . Vitamin D deficiency   . Precordial pain   . Irritability        Plan:     Irritability, anxiety, likely perimenopausal-did not get good response to the Effexor so change to Wellbutrin.  Discussed risk and benefits of medication.  Hypothyroidism-compliant with  88 mcg daily, labs today  Snoring, non-restful sleep-consider sleep study although she does not agree to this right now  Tobacco use-encouraged to stop smoking completely  History of palpitations-continue metoprolol  Her main concern today is fatigue, and like at her first visit in December I reiterated that she has numerous factors that could be contributing to her fatigue including likely COPD, chronic smoker, vitamin D deficiency, perimenopausal symptoms, possibility of sleep apnea, hypothyroidism, stress  Elbow pain - tennis elbow left, discussed periodic use of arm sling, NSAID OTC short term, ice therapy  Follow-up pending labs, encouraged her to go get chest x-ray ordered last visit   Mary Escobar was seen today for med check.  Diagnoses and all orders for this  visit:  Hypothyroidism, unspecified type -     TSH -     T4, Free  Smoker  Family history of premature CAD  Snoring  Palpitations  Fatigue, unspecified type -     Comprehensive metabolic panel -     CBC with Differential/Platelet -     TSH -     T4, Free  Daytime somnolence  Anxiety  Allergic rhinitis, unspecified seasonality, unspecified trigger  Polyarthralgia -     Sedimentation rate  Left elbow pain  Chronic bilateral back pain, unspecified back location  Vitamin D deficiency -     VITAMIN D 25 Hydroxy (Vit-D Deficiency, Fractures)  Precordial pain -     metoprolol succinate (TOPROL-XL) 25 MG 24 hr tablet; Take 1 tablet (25 mg total) by mouth daily.  Irritability  Other orders -     buPROPion (WELLBUTRIN XL) 150 MG 24 hr tablet; Take 1 tablet (150 mg total) by mouth daily.

## 2018-12-09 ENCOUNTER — Other Ambulatory Visit: Payer: Self-pay | Admitting: Medical

## 2018-12-09 DIAGNOSIS — E039 Hypothyroidism, unspecified: Secondary | ICD-10-CM

## 2018-12-09 LAB — COMPREHENSIVE METABOLIC PANEL
A/G RATIO: 2.1 (ref 1.2–2.2)
ALT: 9 IU/L (ref 0–32)
AST: 12 IU/L (ref 0–40)
Albumin: 4.5 g/dL (ref 3.8–4.9)
Alkaline Phosphatase: 73 IU/L (ref 39–117)
BUN/Creatinine Ratio: 18 (ref 9–23)
BUN: 12 mg/dL (ref 6–24)
Bilirubin Total: 0.3 mg/dL (ref 0.0–1.2)
CHLORIDE: 106 mmol/L (ref 96–106)
CO2: 22 mmol/L (ref 20–29)
Calcium: 9 mg/dL (ref 8.7–10.2)
Creatinine, Ser: 0.68 mg/dL (ref 0.57–1.00)
GFR calc Af Amer: 115 mL/min/{1.73_m2} (ref >59)
GFR, EST NON AFRICAN AMERICAN: 100 mL/min/{1.73_m2} (ref >59)
GLUCOSE: 94 mg/dL (ref 65–99)
Globulin, Total: 2.1 g/dL (ref 1.5–4.5)
POTASSIUM: 4.8 mmol/L (ref 3.5–5.2)
Sodium: 143 mmol/L (ref 134–144)
Total Protein: 6.6 g/dL (ref 6.0–8.5)

## 2018-12-09 LAB — CBC WITH DIFFERENTIAL/PLATELET
BASOS ABS: 0 10*3/uL (ref 0.0–0.2)
BASOS: 1 %
EOS (ABSOLUTE): 0.1 10*3/uL (ref 0.0–0.4)
Eos: 2 %
Hematocrit: 47.2 % — ABNORMAL HIGH (ref 34.0–46.6)
Hemoglobin: 15.6 g/dL (ref 11.1–15.9)
IMMATURE GRANS (ABS): 0 10*3/uL (ref 0.0–0.1)
IMMATURE GRANULOCYTES: 0 %
LYMPHS: 34 %
Lymphocytes Absolute: 1.9 10*3/uL (ref 0.7–3.1)
MCH: 32.4 pg (ref 26.6–33.0)
MCHC: 33.1 g/dL (ref 31.5–35.7)
MCV: 98 fL — ABNORMAL HIGH (ref 79–97)
Monocytes Absolute: 0.4 10*3/uL (ref 0.1–0.9)
Monocytes: 7 %
NEUTROS PCT: 56 %
Neutrophils Absolute: 3.3 10*3/uL (ref 1.4–7.0)
PLATELETS: 277 10*3/uL (ref 150–450)
RBC: 4.82 x10E6/uL (ref 3.77–5.28)
RDW: 12.2 % (ref 11.7–15.4)
WBC: 5.7 10*3/uL (ref 3.4–10.8)

## 2018-12-09 LAB — SEDIMENTATION RATE: SED RATE: 18 mm/h (ref 0–40)

## 2018-12-09 LAB — VITAMIN D 25 HYDROXY (VIT D DEFICIENCY, FRACTURES): VIT D 25 HYDROXY: 11.5 ng/mL — AB (ref 30.0–100.0)

## 2018-12-09 LAB — T4, FREE: Free T4: 1.06 ng/dL (ref 0.82–1.77)

## 2018-12-09 LAB — TSH: TSH: 7.13 u[IU]/mL — AB (ref 0.450–4.500)

## 2018-12-09 MED ORDER — VITAMIN D 25 MCG (1000 UNIT) PO TABS: 1000.0000 [IU] | ORAL_TABLET | Freq: Every day | ORAL | 3 refills | Status: DC

## 2018-12-09 MED ORDER — LEVOTHYROXINE SODIUM 100 MCG PO TABS: 100.0000 ug | ORAL_TABLET | Freq: Every day | ORAL | 5 refills | Status: DC

## 2019-01-21 ENCOUNTER — Other Ambulatory Visit: Payer: Self-pay

## 2019-06-18 ENCOUNTER — Ambulatory Visit: Payer: 59 | Admitting: Medical

## 2019-06-18 ENCOUNTER — Other Ambulatory Visit: Payer: Self-pay

## 2019-06-18 ENCOUNTER — Encounter: Payer: Self-pay | Admitting: Medical

## 2019-06-18 VITALS — BP 110/70 | HR 52 | Temp 97.7°F | Ht 67.0 in | Wt 181.8 lb

## 2019-06-18 DIAGNOSIS — E039 Hypothyroidism, unspecified: Secondary | ICD-10-CM

## 2019-06-18 DIAGNOSIS — R454 Irritability and anger: Secondary | ICD-10-CM

## 2019-06-18 DIAGNOSIS — R5383 Other fatigue: Secondary | ICD-10-CM | POA: Diagnosis not present

## 2019-06-18 DIAGNOSIS — F17200 Nicotine dependence, unspecified, uncomplicated: Secondary | ICD-10-CM

## 2019-06-18 DIAGNOSIS — E559 Vitamin D deficiency, unspecified: Secondary | ICD-10-CM | POA: Diagnosis not present

## 2019-06-18 DIAGNOSIS — R942 Abnormal results of pulmonary function studies: Secondary | ICD-10-CM

## 2019-06-18 MED ORDER — ANORO ELLIPTA 62.5-25 MCG/INH IN AEPB: 1.0000 | INHALATION_SPRAY | Freq: Every day | RESPIRATORY_TRACT | 0 refills | Status: DC

## 2019-06-18 NOTE — Progress Notes (Signed)
Subjective: Chief Complaint  Patient presents with  . Medication Management   Here for med check and follow-up.  At her last visit March 2020 we did routine labs, we adjusted her thyroid dose, and we talked about her tobacco use and abnormal PFT done at visit.  She continues to note fatigue, decreased energy.  She notes some dyspnea with stairs, but no other chest pain or difficulty breathing.  She has been a smoker for 30+ years.  She still smokes now.  She sees gynecology soon to discuss perimenopause, medications.  She notes last visit she tried the bupropion but did not feel quite right on it.  She is considering giving another trial.  She just started back on vitamin D supplements this week.  she is compliant with thyroid medication.  No other aggravating or relieving factors. No other complaint.   Past Medical History:  Diagnosis Date  . Hypothyroid   . IBS (irritable bowel syndrome)   . mild nonobstructive CAD on cath 2009   . Palpitation    Current Outpatient Medications on File Prior to Visit  Medication Sig Dispense Refill  . buPROPion (WELLBUTRIN XL) 150 MG 24 hr tablet Take 1 tablet (150 mg total) by mouth daily. 30 tablet 2  . cholecalciferol (VITAMIN D3) 25 MCG (1000 UT) tablet Take 1 tablet (1,000 Units total) by mouth daily. 90 tablet 3  . JOLIVETTE 0.35 MG tablet Take 1 tablet by mouth daily.  4  . levothyroxine (SYNTHROID) 88 MCG tablet TK 1 T PO QD UTD    . metoprolol succinate (TOPROL-XL) 25 MG 24 hr tablet Take 1 tablet (25 mg total) by mouth daily. 90 tablet 3   No current facility-administered medications on file prior to visit.    ROS as in subjective   Objective: BP 110/70   Pulse (!) 52   Temp 97.7 F (36.5 C)   Ht 5\' 7"  (1.702 m)   Wt 181 lb 12.8 oz (82.5 kg)   SpO2 98%   BMI 28.47 kg/m   Wt Readings from Last 3 Encounters:  06/18/19 181 lb 12.8 oz (82.5 kg)  12/08/18 182 lb 12.8 oz (82.9 kg)  10/01/18 178 lb 6.4 oz (80.9 kg)   General  appearance: alert, no distress, WD/WN,  HEENT: normocephalic, sclerae anicteric, TMs pearly, nares patent, no discharge or erythema, pharynx normal Oral cavity: MMM, no lesions Neck: supple, no lymphadenopathy, no thyromegaly, no masses Heart: RRR, normal S1, S2, no murmurs Lungs: CTA bilaterally, no wheezes, rhonchi, or rales Pulses: 2+ symmetric, upper and lower extremities, normal cap refill Ext: no edema     Assessment: Encounter Diagnoses  Name Primary?  . Hypothyroidism, unspecified type Yes  . Vitamin D deficiency   . Irritability   . Fatigue, unspecified type   . Smoker   . Abnormal PFT      Plan: Hypothyroidism-recheck labs today, continue current medication, discussed proper use of medication  Vitamin D deficiency-advise use of thousand units daily supplement  Irritability, fatigue, discussed possible causes of fatigue which could include PFT findings of COPD last visit, smoker, perimenopausal, hypothyroidism, decreased exercise tolerance.  Follow-up pending labs  She will follow-up with gynecology soon for routine checkup  Tobacco use, abnormal PFT last visit-begin trial of Anoro.  Discussed proper use  Amenda was seen today for medication management.  Diagnoses and all orders for this visit:  Hypothyroidism, unspecified type -     TSH -     T4, free  Vitamin D deficiency  Irritability  Fatigue, unspecified type  Smoker  Abnormal PFT  Other orders -     umeclidinium-vilanterol (ANORO ELLIPTA) 62.5-25 MCG/INH AEPB; Inhale 1 puff into the lungs daily.

## 2019-06-19 LAB — T4, FREE: Free T4: 1.3 ng/dL (ref 0.82–1.77)

## 2019-06-19 LAB — TSH: TSH: 1.36 u[IU]/mL (ref 0.450–4.500)

## 2019-07-05 ENCOUNTER — Telehealth: Payer: Self-pay | Admitting: Medical

## 2019-07-05 NOTE — Telephone Encounter (Signed)
Pt called and states she had a appt with Shane on  09/25 and was to call back and let him know how the inhaler worked. She states that the inhaler did help. She states it was a large improvement but did help. Pt can be reached at [phone].

## 2019-07-09 ENCOUNTER — Telehealth: Payer: Self-pay | Admitting: Medical

## 2019-07-09 MED ORDER — ANORO ELLIPTA 62.5-25 MCG/INH IN AEPB: 1.0000 | INHALATION_SPRAY | Freq: Every day | RESPIRATORY_TRACT | 1 refills | Status: DC

## 2019-07-09 NOTE — Telephone Encounter (Signed)
Please advise if Mary Escobar pt can have a refill on her Anoro inhaler. [place]

## 2019-07-09 NOTE — Telephone Encounter (Signed)
Pt called, she needs refill on Anoro inhaler She thought it was going to be sent in when she called back to tell him how it was working

## 2019-07-13 ENCOUNTER — Other Ambulatory Visit: Payer: Self-pay | Admitting: Medical

## 2019-07-13 NOTE — Telephone Encounter (Signed)
Patient stated she would like to try symbicort however she is not sure if insurance will cover so she wants to know if she can get samples. If so what dosage is best

## 2019-07-13 NOTE — Telephone Encounter (Signed)
If I understood the message right, she noticed some improvement but not a big improvement on Anoro inhaler?  If that is correct, I would like her to try different inhaler called Symbicort which may work a little better.  The whole point is to see an improvement in energy and stamina with activity and exercise.  See if she would like to try Symbicort instead?  Also as we discussed prior make sure she follows up with gynecology  Does she need a refill any medication?  It would appear that Wellbutrin and maybe one of the medication is either expiring or has expired

## 2019-07-13 NOTE — Telephone Encounter (Signed)
lmom for patient to call back for message from provider.

## 2019-07-16 ENCOUNTER — Telehealth: Payer: Self-pay

## 2019-07-16 DIAGNOSIS — K589 Irritable bowel syndrome without diarrhea: Secondary | ICD-10-CM | POA: Insufficient documentation

## 2019-07-16 NOTE — Telephone Encounter (Signed)
LVM for pt to advise we need to know hoe the anoro inhaler is working for her. If well we will ask Audelia Acton to refill if not we need to find out which symbicort Audelia Acton would like to fill . [place]

## 2019-07-16 NOTE — Telephone Encounter (Signed)
Advised pt it is time to schedule a cpe with Audelia Acton and a cologuard is need to be ordered please note this in appt desk. [place]

## 2019-08-04 ENCOUNTER — Telehealth: Payer: Self-pay | Admitting: Medical

## 2019-08-04 NOTE — Telephone Encounter (Signed)
Yes we can give Anoro and change back to that.  Set up for virtual recheck tomorrow if possible on the Wellbutrin and other issues discussed last visit  Has she seen gyn yet?  What is status on cologard?

## 2019-08-04 NOTE — Telephone Encounter (Signed)
Pt called and states Symbicort didn't work as well  As the [employer] and the Symbicort made her feel jittery and would like to go back to the Anoro and would like samples, (we have plenty if this is ok).  Her insurance is ending at end of the month and would like 90 day refills on all meds, Levothyroxine, Metoprolol and Wellbutrin.  Also states she think she needs to go up on the Wellbutrin that you had discussed last office visit if possible and refill that also for 90 days before her insurance runs out.

## 2019-08-05 NOTE — Telephone Encounter (Signed)
Pt stated she will pick up Anora on Monday. She scheduled virtual for next week. She has scheduled appt with gyn for 11/25. Working on [employer].

## 2019-08-07 NOTE — Telephone Encounter (Signed)
done

## 2019-08-09 ENCOUNTER — Emergency Department (HOSPITAL_BASED_OUTPATIENT_CLINIC_OR_DEPARTMENT_OTHER)
Admission: EM | Admit: 2019-08-09 | Discharge: 2019-08-09 | Disposition: A | Discharge status: Home / Self Care | Payer: 59 | Attending: Emergency Medicine | Admitting: Emergency Medicine

## 2019-08-09 ENCOUNTER — Encounter (HOSPITAL_BASED_OUTPATIENT_CLINIC_OR_DEPARTMENT_OTHER): Payer: Self-pay | Admitting: Emergency Medicine

## 2019-08-09 ENCOUNTER — Other Ambulatory Visit: Payer: Self-pay

## 2019-08-09 ENCOUNTER — Emergency Department (HOSPITAL_BASED_OUTPATIENT_CLINIC_OR_DEPARTMENT_OTHER): Payer: 59

## 2019-08-09 DIAGNOSIS — Z881 Allergy status to other antibiotic agents status: Secondary | ICD-10-CM | POA: Diagnosis not present

## 2019-08-09 DIAGNOSIS — N201 Calculus of ureter: Secondary | ICD-10-CM | POA: Diagnosis not present

## 2019-08-09 DIAGNOSIS — F17210 Nicotine dependence, cigarettes, uncomplicated: Secondary | ICD-10-CM | POA: Insufficient documentation

## 2019-08-09 DIAGNOSIS — Z79899 Other long term (current) drug therapy: Secondary | ICD-10-CM | POA: Insufficient documentation

## 2019-08-09 DIAGNOSIS — E039 Hypothyroidism, unspecified: Secondary | ICD-10-CM | POA: Diagnosis not present

## 2019-08-09 DIAGNOSIS — Z882 Allergy status to sulfonamides status: Secondary | ICD-10-CM | POA: Diagnosis not present

## 2019-08-09 DIAGNOSIS — R1032 Left lower quadrant pain: Secondary | ICD-10-CM | POA: Diagnosis present

## 2019-08-09 DIAGNOSIS — Z888 Allergy status to other drugs, medicaments and biological substances status: Secondary | ICD-10-CM | POA: Diagnosis not present

## 2019-08-09 LAB — CBC WITH DIFFERENTIAL/PLATELET
Abs Immature Granulocytes: 0.03 10*3/uL (ref 0.00–0.07)
Basophils Absolute: 0 10*3/uL (ref 0.0–0.1)
Basophils Relative: 0 %
Eosinophils Absolute: 0.1 10*3/uL (ref 0.0–0.5)
Eosinophils Relative: 1 %
HCT: 47.7 % — ABNORMAL HIGH (ref 36.0–46.0)
Hemoglobin: 15.5 g/dL — ABNORMAL HIGH (ref 12.0–15.0)
Immature Granulocytes: 0 %
Lymphocytes Relative: 29 %
Lymphs Abs: 3.2 10*3/uL (ref 0.7–4.0)
MCH: 31.7 pg (ref 26.0–34.0)
MCHC: 32.5 g/dL (ref 30.0–36.0)
MCV: 97.5 fL (ref 80.0–100.0)
Monocytes Absolute: 0.6 10*3/uL (ref 0.1–1.0)
Monocytes Relative: 6 %
Neutro Abs: 6.8 10*3/uL (ref 1.7–7.7)
Neutrophils Relative %: 64 %
Platelets: 304 10*3/uL (ref 150–400)
RBC: 4.89 MIL/uL (ref 3.87–5.11)
RDW: 13 % (ref 11.5–15.5)
WBC: 10.8 10*3/uL — ABNORMAL HIGH (ref 4.0–10.5)
nRBC: 0 % (ref 0.0–0.2)

## 2019-08-09 LAB — URINALYSIS, ROUTINE W REFLEX MICROSCOPIC
Glucose, UA: NEGATIVE mg/dL
Ketones, ur: NEGATIVE mg/dL
Leukocytes,Ua: NEGATIVE
Nitrite: NEGATIVE
Protein, ur: 30 mg/dL — AB
Specific Gravity, Urine: >1.03 — ABNORMAL HIGH (ref 1.005–1.030)
pH: 5 (ref 5.0–8.0)

## 2019-08-09 LAB — BASIC METABOLIC PANEL
Anion gap: 8 (ref 5–15)
BUN: 17 mg/dL (ref 6–20)
CO2: 24 mmol/L (ref 22–32)
Calcium: 9.2 mg/dL (ref 8.9–10.3)
Chloride: 109 mmol/L (ref 98–111)
Creatinine, Ser: 1.03 mg/dL — ABNORMAL HIGH (ref 0.44–1.00)
GFR calc Af Amer: >60 mL/min (ref >60)
GFR calc non Af Amer: >60 mL/min (ref >60)
Glucose, Bld: 173 mg/dL — ABNORMAL HIGH (ref 70–99)
Potassium: 4 mmol/L (ref 3.5–5.1)
Sodium: 141 mmol/L (ref 135–145)

## 2019-08-09 LAB — URINALYSIS, MICROSCOPIC (REFLEX): RBC / HPF: >50 RBC/hpf (ref 0–5)

## 2019-08-09 IMAGING — CT CT RENAL STONE PROTOCOL
2 of 4 series · 17 of 46 positions shown, 19 images · non-contrast
Comparison: None.

CLINICAL DATA: Left flank pain beginning this morning. Suspected
stone disease.

EXAM:
CT ABDOMEN AND PELVIS WITHOUT CONTRAST
TECHNIQUE: Multidetector CT imaging of the abdomen and pelvis was performed
following the standard protocol without IV contrast.

[Series 2: axial st · axial · 0.91mm/px · z∈[-502,-92]mm · 14 of 90 slices shown, 16 images]
[im 4/90  soft-tissue]
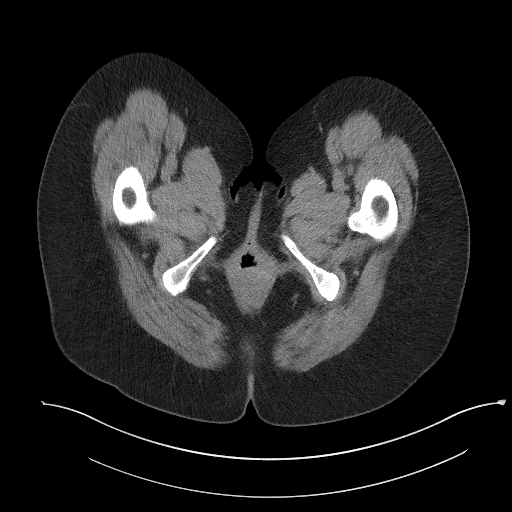
[im 4/90  bone]
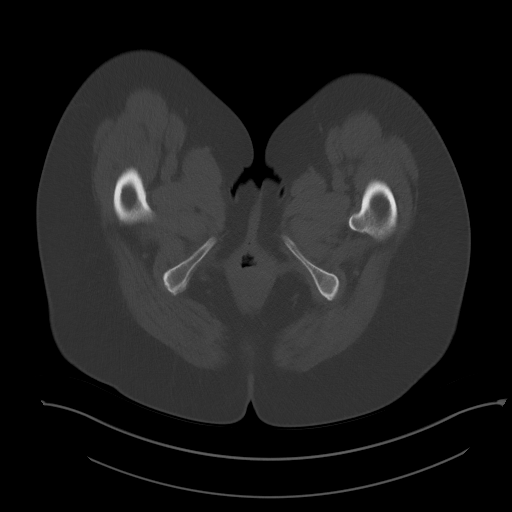
[im 12/90  soft-tissue]
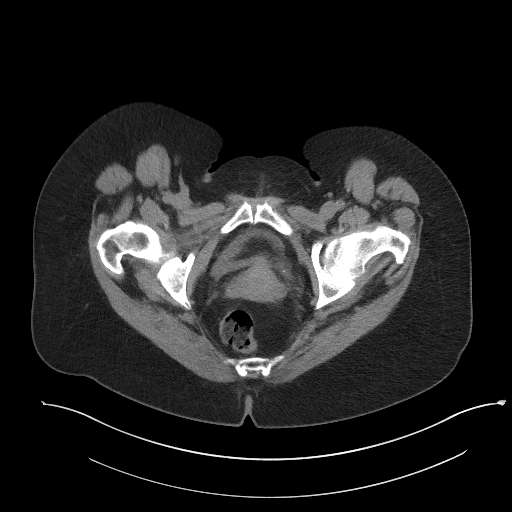
[im 19/90  soft-tissue]
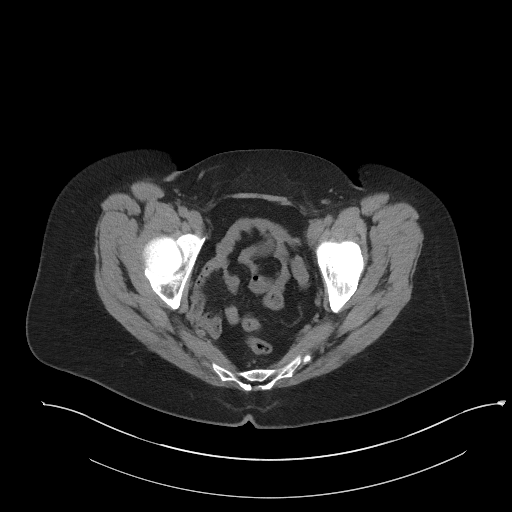
[im 23/90  soft-tissue]
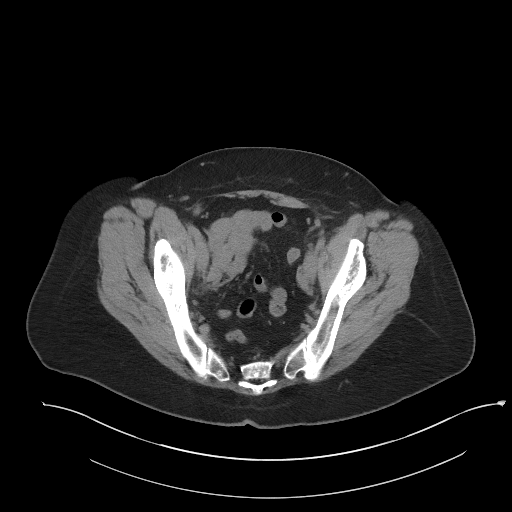
[im 30/90  soft-tissue]
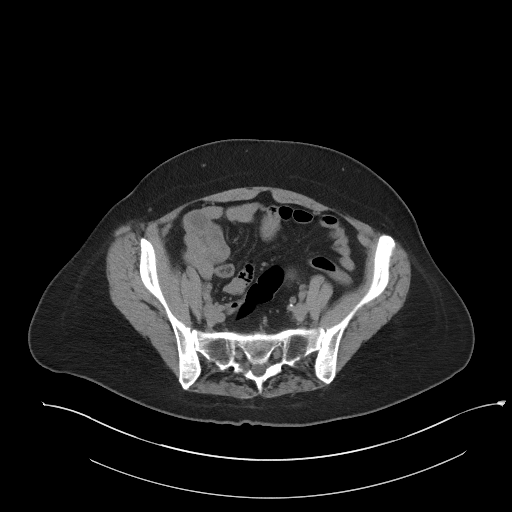
[im 38/90  soft-tissue]
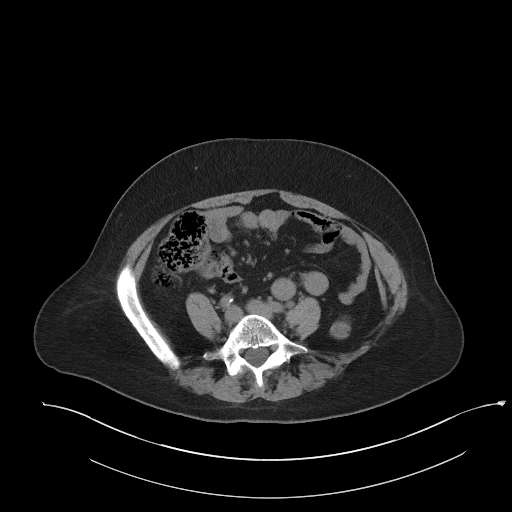
[im 41/90  soft-tissue]
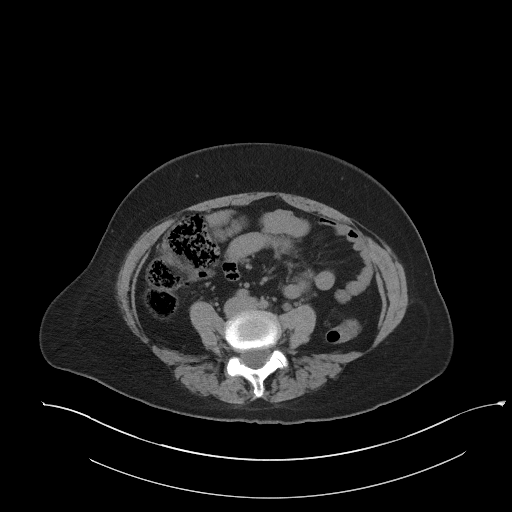
[im 49/90  soft-tissue]
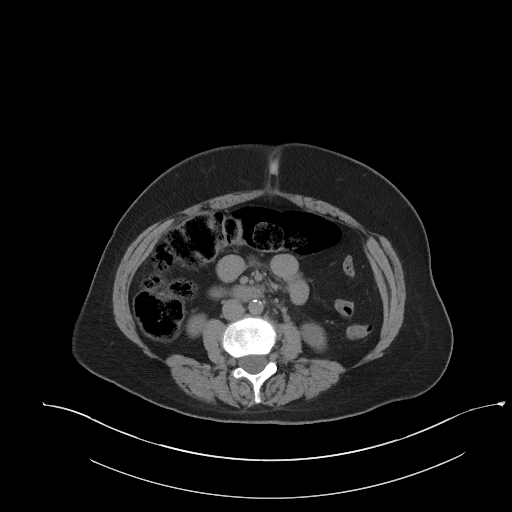
[im 52/90  soft-tissue]
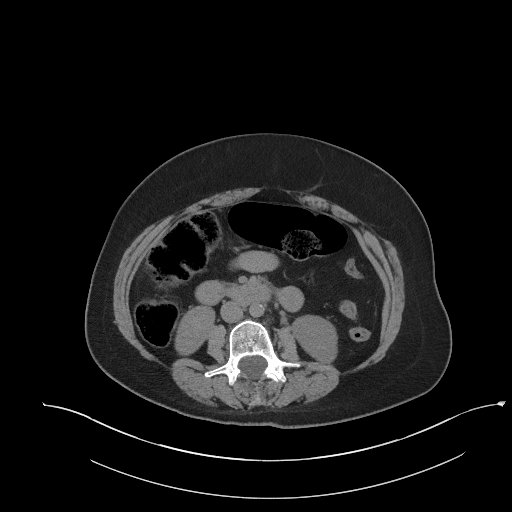
[im 52/90  bone]
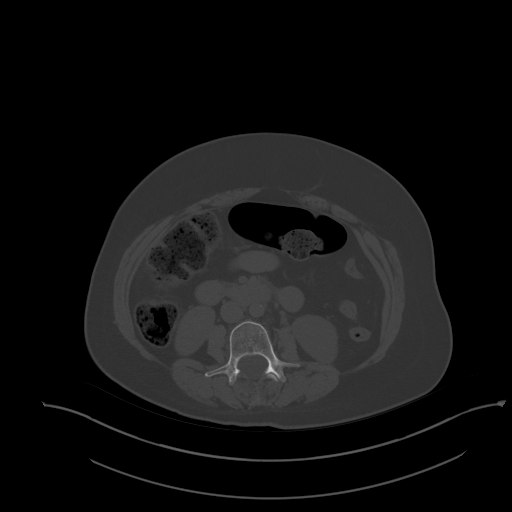
[im 60/90  soft-tissue]
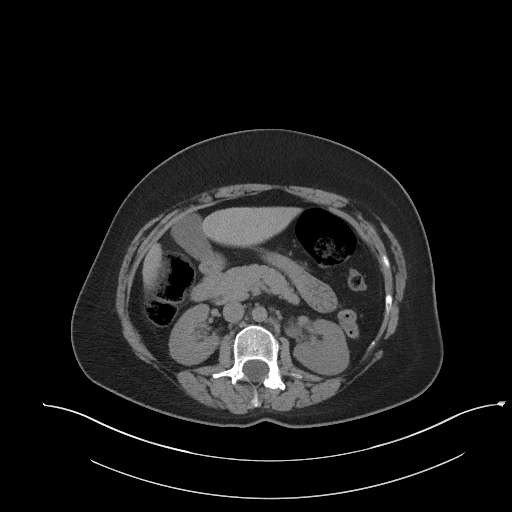
[im 67/90  soft-tissue]
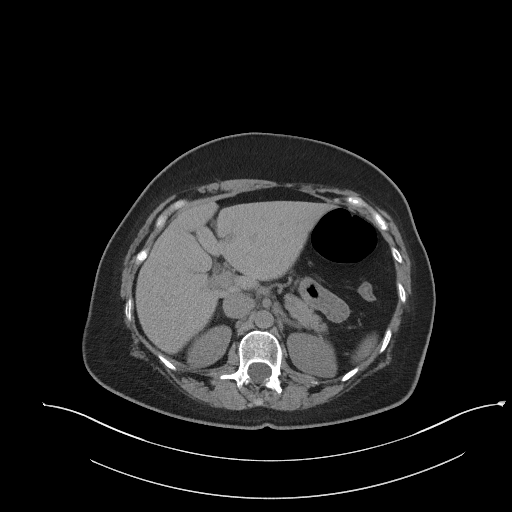
[im 71/90  soft-tissue]
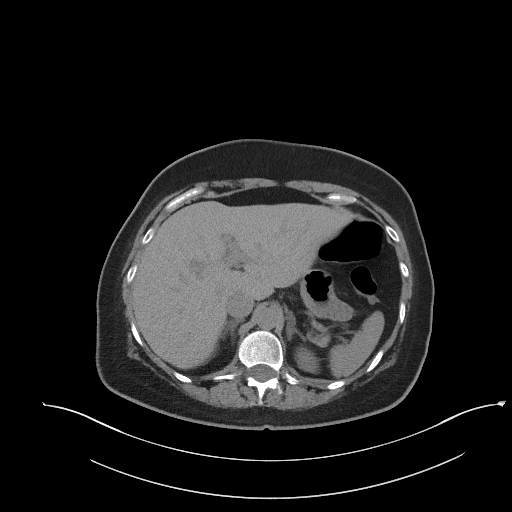
[im 78/90  soft-tissue]
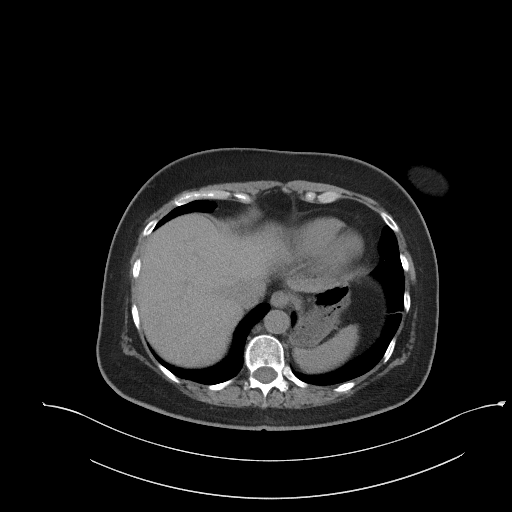
[im 86/90  soft-tissue]
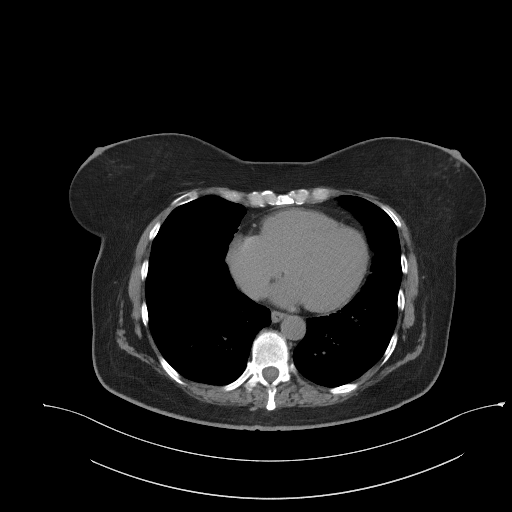

[Series 5: coronal st · coronal · 0.76mm/px · 3 of 92 slices shown]
[im 31/92  soft-tissue]
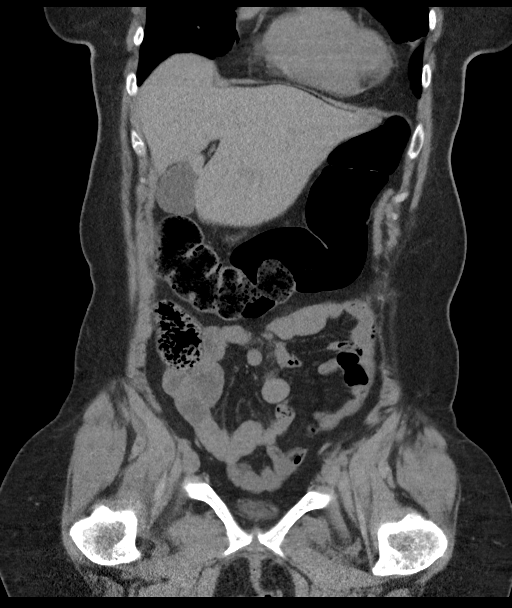
[im 41/92  soft-tissue]
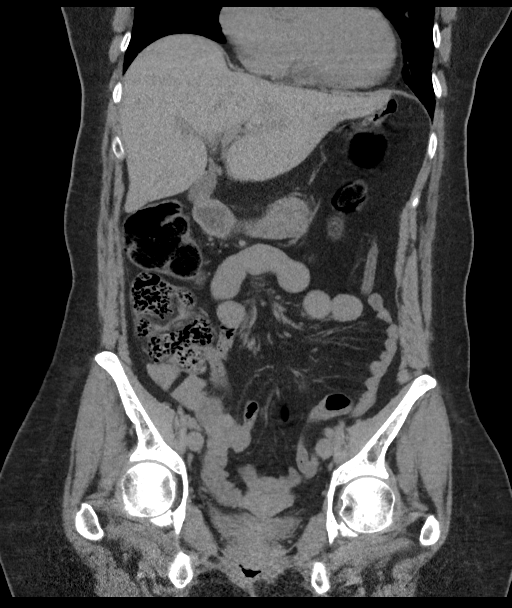
[im 51/92  soft-tissue]
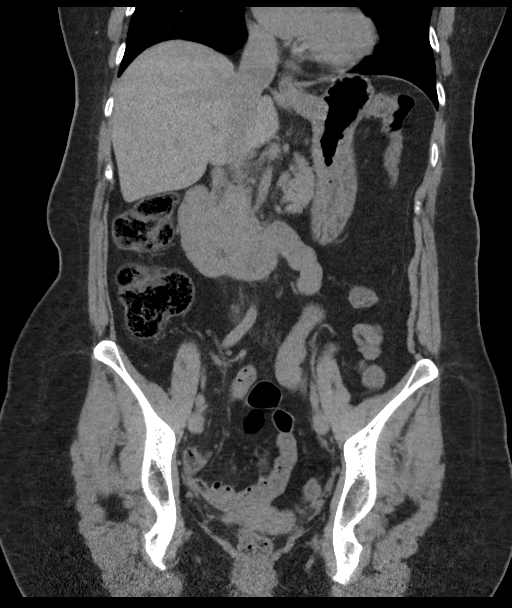

[17 of 46 positions shown; findings below may reference images not displayed]

FINDINGS: Lower chest: No acute findings.

Hepatobiliary: No mass visualized on this unenhanced exam.
Gallbladder is unremarkable. No evidence of biliary ductal
dilatation.

Pancreas: No mass or inflammatory process visualized on this
unenhanced exam.

Spleen:  Within normal limits in size.

Adrenals/Urinary tract: A 2 mm calculus is seen in the midpole the
right kidney. Mild left hydroureteronephrosis is seen due to a 2 mm
calculus at the left ureterovesical junction.

Stomach/Bowel: No evidence of obstruction, inflammatory process, or
abnormal fluid collections.

Vascular/Lymphatic: No pathologically enlarged lymph nodes
identified. No evidence of abdominal aortic aneurysm. Aortic
atherosclerosis.

Reproductive:  No mass or other significant abnormality.

Other:  None.

Musculoskeletal:  No suspicious bone lesions identified.
IMPRESSION: Mild left hydroureteronephrosis due to 2 mm calculus at the left
ureterovesical junction.

2 mm nonobstructing right renal calculus.

Aortic Atherosclerosis ([3S]-[3S]).

## 2019-08-09 MED ORDER — ONDANSETRON HCL 4 MG PO TABS: 4.0000 mg | ORAL_TABLET | Freq: Three times a day (TID) | ORAL | 0 refills | Status: DC | PRN

## 2019-08-09 MED ORDER — OXYCODONE-ACETAMINOPHEN 5-325 MG PO TABS
2.0000 | ORAL_TABLET | Freq: Once | ORAL | Status: AC
  Administered 2019-08-09: 2 via ORAL
  Filled 2019-08-09: qty 2

## 2019-08-09 MED ORDER — ONDANSETRON HCL 4 MG/2ML IJ SOLN
4.0000 mg | Freq: Once | INTRAMUSCULAR | Status: AC
  Administered 2019-08-09: 4 mg via INTRAVENOUS
  Filled 2019-08-09: qty 2

## 2019-08-09 MED ORDER — OXYCODONE-ACETAMINOPHEN 5-325 MG PO TABS: 1.0000 | ORAL_TABLET | Freq: Four times a day (QID) | ORAL | 0 refills | Status: DC | PRN

## 2019-08-09 MED ORDER — SODIUM CHLORIDE 0.9 % IV BOLUS
1000.0000 mL | Freq: Once | INTRAVENOUS | Status: AC
  Administered 2019-08-09: 1000 mL via INTRAVENOUS

## 2019-08-09 MED ORDER — ONDANSETRON 4 MG PO TBDP
ORAL_TABLET | ORAL | Status: AC
  Filled 2019-08-09: qty 1

## 2019-08-09 MED ORDER — FENTANYL CITRATE (PF) 100 MCG/2ML IJ SOLN
50.0000 ug | INTRAMUSCULAR | Status: DC | PRN
  Administered 2019-08-09: 50 ug via INTRAVENOUS
  Filled 2019-08-09: qty 2

## 2019-08-09 MED ORDER — ONDANSETRON 8 MG PO TBDP
8.0000 mg | ORAL_TABLET | Freq: Once | ORAL | Status: AC
  Administered 2019-08-09: 8 mg via ORAL
  Filled 2019-08-09: qty 1

## 2019-08-09 MED ORDER — KETOROLAC TROMETHAMINE 30 MG/ML IJ SOLN
30.0000 mg | Freq: Once | INTRAMUSCULAR | Status: AC
  Administered 2019-08-09: 30 mg via INTRAVENOUS
  Filled 2019-08-09: qty 1

## 2019-08-09 MED FILL — OXYCODONE-ACETAMINOPHEN 5-3: 5-325 | 3 days supply | Qty: 15 | Fill #0

## 2019-08-09 MED FILL — ONDANSETRON HCL 4 MG TABLET: 4 | 5 days supply | Qty: 15 | Fill #0

## 2019-08-09 NOTE — ED Notes (Signed)
IV pump malfunctioned and would not stop beeping, so IV fluids had to be removed and run to gravity.

## 2019-08-09 NOTE — ED Provider Notes (Signed)
[place] EMERGENCY DEPARTMENT Provider Note   CSN: [id number] Arrival date & time: 08/09/19  [id number]     History   Chief Complaint Chief Complaint  Patient presents with  . Flank pain and vomiting    HPI Mary Escobar is a 53 y.o. female.  She is presenting with acute left flank pain that started 2 hours prior to arrival.  Woke her up from sleep.  Radiates around into her left lower quadrant and groin.  Associated with nausea and vomiting.  Has had some urinary frequency for the last few days.  No dysuria.  No hematuria.  No fever.  No prior history of this.  No known trauma.  No numbness or weakness.     The history is provided by the patient.  Flank Pain This is a new problem. The current episode started 1 to 2 hours ago. The problem occurs constantly. The problem has not changed since onset.Associated symptoms include abdominal pain. Pertinent negatives include no chest pain, no headaches and no shortness of breath. Nothing aggravates the symptoms. Nothing relieves the symptoms. She has tried nothing for the symptoms. The treatment provided no relief.    Past Medical History:  Diagnosis Date  . Hypothyroid   . IBS (irritable bowel syndrome)   . mild nonobstructive CAD on cath 2009   . Palpitation     Patient Active Problem List   Diagnosis Date Noted  . Irritable bowel syndrome 07/16/2019  . Abnormal PFT 06/18/2019  . Chronic bilateral back pain 12/08/2018  . Left elbow pain 12/08/2018  . Polyarthralgia 12/08/2018  . Vitamin D deficiency 12/08/2018  . Irritability 12/08/2018  . Fatigue 09/08/2018  . Lymphadenitis 09/08/2018  . Daytime somnolence 09/08/2018  . Snoring 09/08/2018  . Family history of premature CAD 09/08/2018  . Chest pain 11/21/2015  . Smoker 11/21/2015  . Maxillary sinusitis, acute 10/24/2011  . Nicotine dependence 10/24/2011  . Depressive disorder, not elsewhere classified 08/03/2008  . Allergic rhinitis 10/29/2007  . DYSPNEA ON  EXERTION 10/29/2007  . CHEST PAIN, ACUTE 10/29/2007  . PALPITATIONS 04/08/2007  . RHINITIS, CHRONIC 11/28/2006  . Hypothyroidism 07/01/2006  . Anxiety 07/01/2006    Past Surgical History:  Procedure Laterality Date  . WRIST SURGERY       OB History   No obstetric history on file.      Home Medications    Prior to Admission medications   Medication Sig Start Date End Date Taking? Authorizing Provider  buPROPion (WELLBUTRIN XL) 150 MG 24 hr tablet Take 1 tablet (150 mg total) by mouth daily. 12/08/18   Tysinger, Camelia Eng, PA-C  cholecalciferol (VITAMIN D3) 25 MCG (1000 UT) tablet Take 1 tablet (1,000 Units total) by mouth daily. 12/09/18   Tysinger, Camelia Eng, PA-C  JOLIVETTE 0.35 MG tablet Take 1 tablet by mouth daily. 11/08/15   [provider]  levothyroxine (SYNTHROID) 88 MCG tablet TK 1 T PO QD UTD 06/08/19   [provider]  metoprolol succinate (TOPROL-XL) 25 MG 24 hr tablet Take 1 tablet (25 mg total) by mouth daily. 12/08/18   Tysinger, Camelia Eng, PA-C    Family History Family History  Problem Relation Age of Onset  . CAD Father        MI at age 26  . Heart disease Father        CABG x 3  . Sleep apnea Father   . Arrhythmia Father        pacemaker  . COPD Father   .  Diabetes Father   . Thyroid disease Father   . Hyperlipidemia Father   . Diabetes Mother   . Other Mother        carcinoid tumor  . Hyperlipidemia Mother   . Crohn's disease Sister   . Cancer Maternal Aunt        colon    Social History Social History   Tobacco Use  . Smoking status: Current Every Day Smoker    Packs/day: 0.50    Types: Cigarettes  . Smokeless tobacco: Never Used  Substance Use Topics  . Alcohol use: No    Alcohol/week: 0.0 standard drinks  . Drug use: No     Allergies   Citalopram, Ciprofloxacin, Sulfonamide derivatives, and Sulfa antibiotics   Review of Systems Review of Systems  Constitutional: Negative for fever.  HENT: Negative for sore throat.    Eyes: Negative for visual disturbance.  Respiratory: Negative for shortness of breath.   Cardiovascular: Negative for chest pain.  Gastrointestinal: Positive for abdominal pain, nausea and vomiting.  Genitourinary: Positive for flank pain and frequency. Negative for dysuria and hematuria.  Musculoskeletal: Positive for back pain.  Skin: Negative for rash.  Neurological: Negative for headaches.     Physical Exam Updated Vital Signs BP (!) 156/111 (BP Location: Right Arm)   Pulse (!) 56   Temp 97.6 F (36.4 C) (Oral)   Resp 20   Ht 5\' 7"  (1.702 m)   SpO2 98%   BMI 28.47 kg/m   Physical Exam Vitals signs and nursing note reviewed.  Constitutional:      General: She is not in acute distress.    Appearance: She is well-developed.  HENT:     Head: Normocephalic and atraumatic.  Eyes:     Conjunctiva/sclera: Conjunctivae normal.  Neck:     Musculoskeletal: Neck supple.  Cardiovascular:     Rate and Rhythm: Normal rate and regular rhythm.     Pulses: Normal pulses.     Heart sounds: No murmur.  Pulmonary:     Effort: Pulmonary effort is normal. No respiratory distress.     Breath sounds: Normal breath sounds.  Abdominal:     Palpations: Abdomen is soft.     Tenderness: There is no abdominal tenderness.  Musculoskeletal: Normal range of motion.        General: No deformity or signs of injury.  Skin:    General: Skin is warm and dry.  Neurological:     General: No focal deficit present.     Mental Status: She is alert.     Sensory: No sensory deficit.     Motor: No weakness.      ED Treatments / Results  Labs (all labs ordered are listed, but only abnormal results are displayed) Labs Reviewed  URINALYSIS, ROUTINE W REFLEX MICROSCOPIC - Abnormal; Notable for the following components:      Result Value   APPearance CLOUDY (*)    Specific Gravity, Urine >1.030 (*)    Hgb urine dipstick LARGE (*)    Bilirubin Urine SMALL (*)    Protein, ur 30 (*)    All other  components within normal limits  BASIC METABOLIC PANEL - Abnormal; Notable for the following components:   Glucose, Bld 173 (*)    Creatinine, Ser 1.03 (*)    All other components within normal limits  CBC WITH DIFFERENTIAL/PLATELET - Abnormal; Notable for the following components:   WBC 10.8 (*)    Hemoglobin 15.5 (*)    HCT 47.7 (*)  All other components within normal limits  URINALYSIS, MICROSCOPIC (REFLEX) - Abnormal; Notable for the following components:   Bacteria, UA MANY (*)    All other components within normal limits    EKG None  Radiology Ct Renal Stone Study  Result Date: 08/09/2019 CLINICAL DATA:  Left flank pain beginning this morning. Suspected stone disease. EXAM: CT ABDOMEN AND PELVIS WITHOUT CONTRAST TECHNIQUE: Multidetector CT imaging of the abdomen and pelvis was performed following the standard protocol without IV contrast. COMPARISON:  None. FINDINGS: Lower chest: No acute findings. Hepatobiliary: No mass visualized on this unenhanced exam. Gallbladder is unremarkable. No evidence of biliary ductal dilatation. Pancreas: No mass or inflammatory process visualized on this unenhanced exam. Spleen:  Within normal limits in size. Adrenals/Urinary tract: A 2 mm calculus is seen in the midpole the right kidney. Mild left hydroureteronephrosis is seen due to a 2 mm calculus at the left ureterovesical junction. Stomach/Bowel: No evidence of obstruction, inflammatory process, or abnormal fluid collections. Vascular/Lymphatic: No pathologically enlarged lymph nodes identified. No evidence of abdominal aortic aneurysm. Aortic atherosclerosis. Reproductive:  No mass or other significant abnormality. Other:  None. Musculoskeletal:  No suspicious bone lesions identified. IMPRESSION: Mild left hydroureteronephrosis due to 2 mm calculus at the left ureterovesical junction. 2 mm nonobstructing right renal calculus. Aortic Atherosclerosis (ICD10-I70.0). Electronically Signed   By: Marlaine Hind M.D.   On: 08/09/2019 08:02    Procedures Procedures (including critical care time)  Medications Ordered in ED Medications  ondansetron (ZOFRAN) injection 4 mg (4 mg Intravenous Given 08/09/19 0735)  ketorolac (TORADOL) 30 MG/ML injection 30 mg (30 mg Intravenous Given 08/09/19 0743)  sodium chloride 0.9 % bolus 1,000 mL (0 mLs Intravenous Stopped 08/09/19 0842)  oxyCODONE-acetaminophen (PERCOCET/ROXICET) 5-325 MG per tablet 2 tablet (2 tablets Oral Given 08/09/19 0825)  ondansetron (ZOFRAN-ODT) disintegrating tablet 8 mg (8 mg Oral Given 08/09/19 0850)     Initial Impression / Assessment and Plan / ED Course  I have reviewed the triage vital signs and the nursing notes.  Pertinent labs & imaging results that were available during my care of the patient were reviewed by me and considered in my medical decision making (see chart for details).  Clinical Course as of Aug 08 1756  Mon Nov 16, 510  [id number] 53 year old female with acute onset of left flank radiating to left groin plain in the setting of having a few days of urinary frequency.  Differential includes kidney stone, [place], musculoskeletal, shingles   [MB]  0807 CT interpreted by me as left lower ureterolithiasis.  Labs back showing a mildly elevated white blood cell count.  Creatinine slightly elevated at 1.03.  Urinalysis with 50 reds.   [MB]  0813 Reevaluation-pain moderately improved.  We discussed her results.  Will give her a couple Percocet here and send off prescription for medications for home.  Return instructions given.   [MB]    Clinical Course User Index [MB] Hayden Rasmussen, MD        Final Clinical Impressions(s) / ED Diagnoses   Final diagnoses:  Ureterolithiasis    ED Discharge Orders         Ordered    ondansetron (ZOFRAN) 4 MG tablet  Every 8 hours PRN     08/09/19 0822    oxyCODONE-acetaminophen (PERCOCET/ROXICET) 5-325 MG tablet  Every 6 hours PRN     08/09/19 0822            Hayden Rasmussen, MD 08/09/19 1758

## 2019-08-09 NOTE — Discharge Instructions (Addendum)
You were seen in the emergency department for left flank pain.  Your work-up showed that you had a kidney stone that was most of the way down the tube towards your bladder.  This will likely pass on its own.  You can use ibuprofen 3 times a day.  Drink plenty of fluids.  Pain medication and nausea medication as needed.  Follow-up with urology or return to the emergency department if any uncontrolled pain or any fever.

## 2019-08-09 NOTE — ED Notes (Signed)
ED Provider at bedside. 

## 2019-08-09 NOTE — ED Triage Notes (Signed)
Frequent urination for a couple days.  Left flank pain since 5:30 am.  Began vomiting at 6am and continues to vomit at this time. No personal hx of kidney stones but family hx.

## 2019-08-09 NOTE — ED Notes (Signed)
Patient transported to CT 

## 2019-08-12 ENCOUNTER — Ambulatory Visit (INDEPENDENT_AMBULATORY_CARE_PROVIDER_SITE_OTHER): Payer: 59 | Admitting: Medical

## 2019-08-12 ENCOUNTER — Encounter: Payer: Self-pay | Admitting: Medical

## 2019-08-12 ENCOUNTER — Other Ambulatory Visit: Payer: Self-pay

## 2019-08-12 VITALS — Ht 67.0 in

## 2019-08-12 DIAGNOSIS — Z8249 Family history of ischemic heart disease and other diseases of the circulatory system: Secondary | ICD-10-CM | POA: Diagnosis not present

## 2019-08-12 DIAGNOSIS — R942 Abnormal results of pulmonary function studies: Secondary | ICD-10-CM

## 2019-08-12 DIAGNOSIS — Z1211 Encounter for screening for malignant neoplasm of colon: Secondary | ICD-10-CM

## 2019-08-12 DIAGNOSIS — E559 Vitamin D deficiency, unspecified: Secondary | ICD-10-CM

## 2019-08-12 DIAGNOSIS — Z1212 Encounter for screening for malignant neoplasm of rectum: Secondary | ICD-10-CM

## 2019-08-12 DIAGNOSIS — E039 Hypothyroidism, unspecified: Secondary | ICD-10-CM | POA: Diagnosis not present

## 2019-08-12 DIAGNOSIS — F419 Anxiety disorder, unspecified: Secondary | ICD-10-CM

## 2019-08-12 DIAGNOSIS — F17200 Nicotine dependence, unspecified, uncomplicated: Secondary | ICD-10-CM

## 2019-08-12 DIAGNOSIS — R5383 Other fatigue: Secondary | ICD-10-CM

## 2019-08-12 MED ORDER — BUPROPION HCL ER (XL) 300 MG PO TB24: 300.0000 mg | ORAL_TABLET | Freq: Every day | ORAL | 0 refills | Status: DC

## 2019-08-12 MED ORDER — ANORO ELLIPTA 62.5-25 MCG/INH IN AEPB: 1.0000 | INHALATION_SPRAY | Freq: Every day | RESPIRATORY_TRACT | 0 refills | Status: DC

## 2019-08-12 MED ORDER — LEVOTHYROXINE SODIUM 88 MCG PO TABS: ORAL_TABLET | ORAL | 1 refills | Status: DC

## 2019-08-12 NOTE — Progress Notes (Signed)
This visit type was conducted due to national recommendations for restrictions regarding the COVID-19 Pandemic (e.g. social distancing) in an effort to limit this patient's exposure and mitigate transmission in our community.  Due to their co-morbid illnesses, this patient is at least at moderate risk for complications without adequate follow up.  This format is felt to be most appropriate for this patient at this time.    Documentation for virtual audio and video telecommunications through Zoom encounter:  The patient was located at home. The provider was located in the office. The patient did consent to this visit and is aware of possible charges through their insurance for this visit.  The other persons participating in this telemedicine service were none. Time spent on call was 20 minutes and in review of previous records >20 minutes total.  This virtual service is not related to other E/M service within previous 7 days.   Subjective: Chief Complaint  Patient presents with  . Medication Management    dicuss wellbutrin not helping   . Follow-up    ed for kidney stone    Virtual consult for med check.  COPD - based on abnormal PFT this year and tobacco use >30 years.   Doing ok on Anoro inhaler with fatigue symptoms and breathing.  symbicort didn't seem to help and made her feel too jittery.   Wants to c/t anoro.   Is working to c/t efforts to stop tobacco.  Down to 1/2 ppd.    fatigue- dyspnea with stairs, but no other chest pain or difficulty breathing.  She has been a smoker for 30+ years.   She sees gynecology soon for routine f/u and discussed of fatigue, perimenopause.    Vit D deficiency - compliant with daily Vit D.     She was seen in the emergency dept few days ago for kidney stone, first time she ever had this.  It hasn't passed yet, was 76mm.   using oxycodone and nausea medication for symptoms.   No other aggravating or relieving factors. No other complaint.   Past  Medical History:  Diagnosis Date  . Family history of premature CAD   . Fatigue 2020   multifactoral  . Hypothyroid   . IBS (irritable bowel syndrome)   . mild nonobstructive CAD on cath 2009   . Palpitation   . Smoker   . Vitamin D deficiency    Current Outpatient Medications on File Prior to Visit  Medication Sig Dispense Refill  . buPROPion (WELLBUTRIN XL) 150 MG 24 hr tablet Take 1 tablet (150 mg total) by mouth daily. 30 tablet 2  . cholecalciferol (VITAMIN D3) 25 MCG (1000 UT) tablet Take 1 tablet (1,000 Units total) by mouth daily. 90 tablet 3  . JOLIVETTE 0.35 MG tablet Take 1 tablet by mouth daily.  4  . levothyroxine (SYNTHROID) 88 MCG tablet TK 1 T PO QD UTD    . metoprolol succinate (TOPROL-XL) 25 MG 24 hr tablet Take 1 tablet (25 mg total) by mouth daily. 90 tablet 3  . ondansetron (ZOFRAN) 4 MG tablet Take 1 tablet (4 mg total) by mouth every 8 (eight) hours as needed for nausea or vomiting. 15 tablet 0  . oxyCODONE-acetaminophen (PERCOCET/ROXICET) 5-325 MG tablet Take 1-2 tablets by mouth every 6 (six) hours as needed for severe pain. 15 tablet 0   No current facility-administered medications on file prior to visit.     Family History  Problem Relation Age of Onset  . CAD Father  MI at age 68  . Heart disease Father        CABG x 3  . Sleep apnea Father   . Arrhythmia Father        pacemaker  . COPD Father   . Diabetes Father   . Thyroid disease Father   . Hyperlipidemia Father   . Diabetes Mother   . Other Mother        carcinoid tumor  . Hyperlipidemia Mother   . Crohn's disease Sister   . Cancer Maternal Aunt        colon    ROS as in subjective   Objective: Ht 5\' 7"  (1.702 m)   BMI 28.47 kg/m   Wt Readings from Last 3 Encounters:  06/18/19 181 lb 12.8 oz (82.5 kg)  12/08/18 182 lb 12.8 oz (82.9 kg)  10/01/18 178 lb 6.4 oz (80.9 kg)   General appearance: alert, no distress, WD/WN,  Not examined in person as this was virtual  consult     Assessment: Encounter Diagnoses  Name Primary?  . Hypothyroidism, unspecified type Yes  . Anxiety   . Abnormal PFT   . Family history of premature CAD   . Smoker   . Fatigue, unspecified type   . Vitamin D deficiency      Plan: Discussed limitations of virtual consult  Fatigue - discussed possible causes of fatigue which could include PFT findings of COPD last visit, smoker, perimenopausal, hypothyroidism, decreased exercise tolerance  Smoker, abnormal PFT, COPD - doing ok on Anoro after recent trial.   Didn't do as well on Symbicort and it made her feel jittery.  reviewed abnormal PFT from 08/2018.  hypothyroidism - recent labs 05/2019 at goal, c/t same therapy  C/t vit D deficiency - c/t Vit D 1000 iu daily, abnormal lab 11/2018  She will be seeing gynecology next week for routine care  Screen for colono cancer - she has been referred to [facility]  Renal stone - still trying pass stone.  No prior stone.  Reviewed recent ED report, CT scan.  Using oxycodone, nausea medication.  C/t to hydrate well, strain urine.  Call in the next few days if worse or not passing stone.  Aortic atherosclerosis - based on finding from CT abdomen this week at emergency dept.  advised statin.  She will consider.  counseled on low cholesterol diet.    palpations - doing fine with Toprol.   Mary Escobar was seen today for medication management and follow-up.  Diagnoses and all orders for this visit:  Hypothyroidism, unspecified type  Anxiety  Abnormal PFT  Family history of premature CAD  Smoker  Fatigue, unspecified type  Vitamin D deficiency

## 2019-08-13 ENCOUNTER — Telehealth: Payer: Self-pay

## 2019-08-13 ENCOUNTER — Other Ambulatory Visit: Payer: Self-pay | Admitting: Medical

## 2019-08-13 MED ORDER — TAMSULOSIN HCL 0.4 MG PO CAPS: 0.4000 mg | ORAL_CAPSULE | Freq: Every day | ORAL | 0 refills | Status: DC

## 2019-08-13 NOTE — Telephone Encounter (Signed)
I sent flomax medication to aid with passage of stone.  Get the Anoro samples ready for her

## 2019-08-13 NOTE — Telephone Encounter (Signed)
Pt. Called stating you told her to let you if she didn't pass the kidney stone yet and you could call her in something to help pass it quicker. Per. Pt. She has not passed it yet, pt. Also said she was going to stop by the office to get some new anora samples, she will stop by after 1:45 p.m. to pick them up.

## 2019-08-13 NOTE — Telephone Encounter (Signed)
Left detail message for patient about prescription being at the pharmacy. Patient was asked to call the office when she can come get her Anoro samples.

## 2019-08-18 DIAGNOSIS — N200 Calculus of kidney: Secondary | ICD-10-CM | POA: Insufficient documentation

## 2019-08-18 HISTORY — DX: Calculus of kidney: N20.0

## 2019-08-23 ENCOUNTER — Telehealth: Payer: Self-pay | Admitting: Medical

## 2019-08-23 ENCOUNTER — Other Ambulatory Visit: Payer: Self-pay | Admitting: Medical

## 2019-08-23 LAB — HM MAMMOGRAPHY

## 2019-08-23 MED ORDER — OXYCODONE-ACETAMINOPHEN 5-325 MG PO TABS: 1.0000 | ORAL_TABLET | Freq: Four times a day (QID) | ORAL | 0 refills | Status: DC | PRN

## 2019-08-23 NOTE — Telephone Encounter (Signed)
Pt called and states that she loses her insurance today. She will have new insurance as of January 1. She still has not passed the kidney stone and only have 5 pain pills left. She is concerned that will not last until January 1.  Pt can be reached at [phone] and uses [facility] in [place].

## 2019-08-23 NOTE — Telephone Encounter (Signed)
Called and spoke to patient.  She has not passed the stone this far she can tell.  We went over her CT results again from a few weeks ago.  Her stones were 2 mm in size.  I refilled a short supply of her pain medication.  She did not begin the Flomax.   We discussed the relatively low risk of allergy given her prior mild sulfa allergy.  She may begin Flomax, but was advised to watch for rash and to use Benadryl if any signs of reaction and to stop Flomax if any signs of rash.  She will continue to hydrate well, strain urine.  If her symptoms persist without passing stone I advised we do a referral to urology.  She will let me know within the next week

## 2019-12-03 ENCOUNTER — Other Ambulatory Visit: Payer: Self-pay | Admitting: Medical

## 2019-12-03 DIAGNOSIS — R072 Precordial pain: Secondary | ICD-10-CM

## 2019-12-08 ENCOUNTER — Telehealth: Payer: Self-pay | Admitting: Medical

## 2019-12-08 ENCOUNTER — Telehealth (INDEPENDENT_AMBULATORY_CARE_PROVIDER_SITE_OTHER): Payer: 59 | Admitting: Cardiology

## 2019-12-08 ENCOUNTER — Encounter: Payer: Self-pay | Admitting: Cardiology

## 2019-12-08 VITALS — HR 63 | Ht 67.0 in

## 2019-12-08 DIAGNOSIS — J449 Chronic obstructive pulmonary disease, unspecified: Secondary | ICD-10-CM | POA: Insufficient documentation

## 2019-12-08 DIAGNOSIS — I25119 Atherosclerotic heart disease of native coronary artery with unspecified angina pectoris: Secondary | ICD-10-CM | POA: Insufficient documentation

## 2019-12-08 DIAGNOSIS — E039 Hypothyroidism, unspecified: Secondary | ICD-10-CM

## 2019-12-08 DIAGNOSIS — J439 Emphysema, unspecified: Secondary | ICD-10-CM

## 2019-12-08 DIAGNOSIS — R0789 Other chest pain: Secondary | ICD-10-CM

## 2019-12-08 DIAGNOSIS — R5383 Other fatigue: Secondary | ICD-10-CM

## 2019-12-08 DIAGNOSIS — Z87898 Personal history of other specified conditions: Secondary | ICD-10-CM

## 2019-12-08 DIAGNOSIS — I2510 Atherosclerotic heart disease of native coronary artery without angina pectoris: Secondary | ICD-10-CM | POA: Insufficient documentation

## 2019-12-08 DIAGNOSIS — F17200 Nicotine dependence, unspecified, uncomplicated: Secondary | ICD-10-CM

## 2019-12-08 MED ORDER — NITROGLYCERIN 0.4 MG SL SUBL: 0.4000 mg | SUBLINGUAL_TABLET | SUBLINGUAL | 4 refills | Status: AC | PRN

## 2019-12-08 NOTE — Progress Notes (Signed)
Virtual Visit via Telephone Note   This visit type was conducted due to national recommendations for restrictions regarding the COVID-19 Pandemic (e.g. social distancing) in an effort to limit this patient's exposure and mitigate transmission in our community.  Due to her co-morbid illnesses, this patient is at least at moderate risk for complications without adequate follow up.  This format is felt to be most appropriate for this patient at this time.  The patient did not have access to video technology/had technical difficulties with video requiring transitioning to audio format only (telephone).  All issues noted in this document were discussed and addressed.  No physical exam could be performed with this format.  Please refer to the patient's chart for her  consent to telehealth for [facility].   The patient was identified using 2 identifiers.  Date:  12/08/2019   ID:  Mary Escobar, DOB Mar 05, 1966, MRN [id number]  Patient Location: Home Provider Location: Home  PCP:  Carlena Hurl, PA-C  Cardiologist:  Dr Stanford Breed Electrophysiologist:  None   Evaluation Performed:  Follow-Up Visit  Chief Complaint:  Chest pain  History of Present Illness:    Mary Escobar is a 54 y.o. female with palpitations, and mild coronary disease documented by catheterization in 2009 after an abnormal treadmill.  She last saw Dr. Stanford Breed in 2017.  In 2018 she was seen with some palpitations, and she had a unremarkable event monitor October 2018.  She has been treated with beta-blockers although she also has complaints of fatigue and we have been hesitant to increase her beta-blocker for symptoms.  Since 2018 she has had issues with her insurance, she is recently changed and wanted to get back in touch with [place].  I called the patient today and spoke with her on the phone.  She tells me she has been having issues with chest tightness that radiates to her mid back.  Its not particularly exertional, she  does notice it when she is sitting at a computer.  She has dyspnea on exertion but this apparently is chronic.  She has recently been diagnosed with COPD and has a long history of smoking.  She also tells me that she took her heart rate at rest and it was in the 50s, when she had discomfort it went up to 90.  He has had no associated nausea vomiting or diaphoresis.  The patient does not have symptoms concerning for COVID-19 infection (fever, chills, cough, or new shortness of breath).    Past Medical History:  Diagnosis Date  . Family history of premature CAD   . Fatigue 2020   multifactoral  . Hypothyroid   . IBS (irritable bowel syndrome)   . mild nonobstructive CAD on cath 2009   . Palpitation   . Smoker   . Vitamin D deficiency    Past Surgical History:  Procedure Laterality Date  . CORONARY ANGIOGRAM  2009   mild CAD noted after abn GXT  . WRIST SURGERY       Current Meds  Medication Sig  . buPROPion (WELLBUTRIN XL) 300 MG 24 hr tablet Take 1 tablet (300 mg total) by mouth daily.  . cholecalciferol (VITAMIN D3) 25 MCG (1000 UT) tablet Take 1 tablet (1,000 Units total) by mouth daily.  Marland Kitchen ELDERBERRY PO Take by mouth daily.  Marland Kitchen levothyroxine (SYNTHROID) 88 MCG tablet TK 1 T PO QD UTD  . metoprolol succinate (TOPROL-XL) 25 MG 24 hr tablet TAKE 1 TABLET(25 MG) BY MOUTH DAILY  Allergies:   Citalopram, Ciprofloxacin, Sulfonamide derivatives, and Sulfa antibiotics   Social History   Tobacco Use  . Smoking status: Current Every Day Smoker    Packs/day: 0.50    Types: Cigarettes  . Smokeless tobacco: Never Used  Substance Use Topics  . Alcohol use: No    Alcohol/week: 0.0 standard drinks  . Drug use: No     Family Hx: The patient's family history includes Arrhythmia in her father; CAD in her father; COPD in her father; Cancer in her maternal aunt; Crohn's disease in her sister; Diabetes in her father and mother; Heart disease in her father; Hyperlipidemia in her father  and mother; Other in her mother; Sleep apnea in her father; Thyroid disease in her father.  ROS:   Please see the history of present illness.    Kidney stone Nov 2020- aortic atherosclerosis noted on CT All other systems reviewed and are negative.   Prior CV studies:   The following studies were reviewed today: Monitor 2018  Labs/Other Tests and Data Reviewed:    EKG:  An ECG dated 06/06/2017 was personally reviewed today and demonstrated:  NSR, SB-56, biphasic TW V2-V3  Recent Labs: 06/18/2019: TSH 1.360 08/09/2019: BUN 17; Creatinine, Ser 1.03; Hemoglobin 15.5; Platelets 304; Potassium 4.0; Sodium 141   Recent Lipid Panel Lab Results  Component Value Date/Time   CHOL 199 06/05/2007 08:12 PM   TRIG 199 (H) 06/05/2007 08:12 PM   HDL 59 06/05/2007 08:12 PM   CHOLHDL 3.4 Ratio 06/05/2007 08:12 PM   LDLCALC 100 (H) 06/05/2007 08:12 PM   LDLDIRECT 148 (H) 10/30/2007 12:39 AM    Wt Readings from Last 3 Encounters:  06/18/19 181 lb 12.8 oz (82.5 kg)  12/08/18 182 lb 12.8 oz (82.9 kg)  10/01/18 178 lb 6.4 oz (80.9 kg)     Objective:    Vital Signs:  Pulse 63   Ht 5\' 7"  (1.702 m)   BMI 28.47 kg/m    VITAL SIGNS:  reviewed  ASSESSMENT & PLAN:    Chest tightness- Some atypical features but she does have know ASCVD.  She needs to be seen in the office for further evaluation.  CAD- "mild" CAD at cath 2009 after an abnormal GXT  COPD- Long time smoker  HLD- LDL 148 in 2009- not on statin Rx  Plan: Office visit with Dr Stanford Breed or an APP- add ASA 81mg  daily and provide Rx for SL NTG with instructions.   COVID-19 Education: The signs and symptoms of COVID-19 were discussed with the patient and how to seek care for testing (follow up with PCP or arrange E-visit).  The importance of social distancing was discussed today.  Time:   Today, I have spent 20 minutes with the patient with telehealth technology discussing the above problems.     Medication Adjustments/Labs  and Tests Ordered: Current medicines are reviewed at length with the patient today.  Concerns regarding medicines are outlined above.   Tests Ordered: No orders of the defined types were placed in this encounter.   Medication Changes: No orders of the defined types were placed in this encounter.   Follow Up:  In Person Dr Stanford Breed or APP  Signed, Kerin Ransom, PA-C  12/08/2019 11:50 AM    [place]

## 2019-12-08 NOTE — Telephone Encounter (Signed)
Please advise 

## 2019-12-08 NOTE — Patient Instructions (Signed)
Medication Instructions:   START Aspirin 81 mg daily.  A prescription for SUBL Nitroglycerin 0.4 mg has been sent to your pharmacy. Take 1 tablet every 5 minutes as needed for chest pain, MAX. 3 doses.  *If you need a refill on your cardiac medications before your next appointment, please call your pharmacy*   Follow-Up: At [facility], you and your health needs are our priority.  As part of our continuing mission to provide you with exceptional heart care, we have created designated Provider Care Teams.  These Care Teams include your primary Cardiologist (physician) and Advanced Practice Providers (APPs -  Physician Assistants and Nurse Practitioners) who all work together to provide you with the care you need, when you need it.  We recommend signing up for the patient portal called "MyChart".  Sign up information is provided on this After Visit Summary.  MyChart is used to connect with patients for Virtual Visits (Telemedicine).  Patients are able to view lab/test results, encounter notes, upcoming appointments, etc.  Non-urgent messages can be sent to your provider as well.   To learn more about what you can do with MyChart, go to [email or web address].    Your next appointment:   Monday, 12/13/19 at 1:15 PM.  The format for your next appointment:   In Person  Provider:   Fabian Sharp, PA

## 2019-12-08 NOTE — Addendum Note (Signed)
Addended by: Therisa Doyne on: 12/08/2019 01:03 PM   Modules accepted: Orders

## 2019-12-08 NOTE — Telephone Encounter (Signed)
Pt called and states she now has insurance and has an appt scheduled. She states she was given samples of Anora Ellipta. She would like either samples and a rx sent in to pharmacy. Pt uses [facility] in [place] and can be reached at [phone].

## 2019-12-09 ENCOUNTER — Other Ambulatory Visit: Payer: Self-pay | Admitting: Medical

## 2019-12-09 MED ORDER — UMECLIDINIUM-VILANTEROL 62.5-25 MCG/INH IN AEPB: 1.0000 | INHALATION_SPRAY | Freq: Every day | RESPIRATORY_TRACT | 2 refills | Status: DC

## 2019-12-09 NOTE — Telephone Encounter (Signed)
Med sent.

## 2019-12-12 NOTE — Progress Notes (Signed)
Cardiology Office Note:    Date:  12/13/2019   ID:  Mary Escobar, DOB Feb 03, 1966, MRN [id number]  PCP:  Carlena Hurl, PA-C  Cardiologist:  Kirk Ruths, MD   Referring MD: Carlena Hurl, PA-C   Chief Complaint  Patient presents with  . Follow-up  . Headache  . Shortness of Breath  . Chest Pain    History of Present Illness:    Mary Escobar is a 54 y.o. female with a hx of nonobstructive CAD by heart cath in 2009 after an abnormal treadmill stress test. She also has a history of palpitations - event monitor 06/2017 negative for significant arrhythmias. BB has not been titrated due to complaints of fatigue. She also has chronic dyspnea on exertion. She has COPD and long history of smoking. She was seen by [facility] 12/08/19 for a virtual visit and complained of chest tightness that radiated to her mid back. She was scheduled for an in-office appt for further evaluation. She was started on ASA and given PRN nitro.   CT abdomen showed aortic atherosclerosis 07/2019.   She presents today for follow up of her chest pain. She describes chest heaviness across her chest that started several years ago. 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 CP has been happening more often - increasing in frequency and severity over the last 6 months. She also describes a flush feeling from her throat to her face that is not related to her chest pain. CP is described as a pressure. CP occurs under her left breast. CP can manifest as a tingling in her left arm or left leg like her leg is asleep. Chest pain seems to move around, but also in her left chest radiating down her left arm. CP is described as a 2/10. CP lasts less than 5 minutes, but can occur several times per day. CP can be associated with dizziness. She stopped birth control at Christmas time and is likely perimenopausal.   She had been without her  inhaler for a few months for her COPD. She got her inhaler refilled two weeks ago which has relieved some of her chest discomfort.  Father is s/p CABG. Uncle just had a heart attack.  She is still smoking. She has chronic DOE that can "bring on" lightheadedness and dizziness. She has not taken any PRN nitro. DM on both sides of her family, no recent A1c. Do not have an updated lipid profile.     Past Medical History:  Diagnosis Date  . Family history of premature CAD   . Fatigue 2020   multifactoral  . Hypothyroid   . IBS (irritable bowel syndrome)   . mild nonobstructive CAD on cath 2009   . Palpitation   . Smoker   . Vitamin D deficiency     Past Surgical History:  Procedure Laterality Date  . CORONARY ANGIOGRAM  2009   mild CAD noted after abn GXT  . WRIST SURGERY      Current Medications: Current Meds  Medication Sig  . ASPIRIN 81 PO Take 81 mg by mouth daily.  Marland Kitchen buPROPion (WELLBUTRIN XL) 300 MG 24 hr tablet Take 1 tablet (300 mg total) by mouth daily.  . cholecalciferol (VITAMIN D3) 25 MCG (1000 UT) tablet Take 1 tablet (1,000 Units total) by mouth daily.  Marland Kitchen ELDERBERRY PO Take by mouth daily.  Marland Kitchen levothyroxine (SYNTHROID) 88 MCG  tablet TK 1 T PO QD UTD  . metoprolol succinate (TOPROL-XL) 25 MG 24 hr tablet TAKE 1 TABLET(25 MG) BY MOUTH DAILY  . nitroGLYCERIN (NITROSTAT) 0.4 MG SL tablet Place 1 tablet (0.4 mg total) under the tongue every 5 (five) minutes as needed for chest pain.  Marland Kitchen oxyCODONE-acetaminophen (PERCOCET/ROXICET) 5-325 MG tablet Take 1-2 tablets by mouth every 6 (six) hours as needed for severe pain.  Marland Kitchen umeclidinium-vilanterol (ANORO ELLIPTA) 62.5-25 MCG/INH AEPB Inhale 1 puff into the lungs daily.     Allergies:   Citalopram, Ciprofloxacin, Sulfonamide derivatives, and Sulfa antibiotics   Social History   Socioeconomic History  . Marital status: Significant Other    Spouse name: Not on file  . Number of children: 1  . Years of education: Not on file    . Highest education level: Not on file  Occupational History  . Not on file  Tobacco Use  . Smoking status: Current Every Day Smoker    Packs/day: 0.50    Types: Cigarettes  . Smokeless tobacco: Never Used  Substance and Sexual Activity  . Alcohol use: No    Alcohol/week: 0.0 standard drinks  . Drug use: No  . Sexual activity: Not on file  Other Topics Concern  . Not on file  Social History Narrative  . Not on file   Social Determinants of Health   Financial Resource Strain:   . Difficulty of Paying Living Expenses:   Food Insecurity:   . Worried About Charity fundraiser in the Last Year:   . Arboriculturist in the Last Year:   Transportation Needs:   . Film/video editor (Medical):   Marland Kitchen Lack of Transportation (Non-Medical):   Physical Activity:   . Days of Exercise per Week:   . Minutes of Exercise per Session:   Stress:   . Feeling of Stress :   Social Connections:   . Frequency of Communication with Friends and Family:   . Frequency of Social Gatherings with Friends and Family:   . Attends Religious Services:   . Active Member of Clubs or Organizations:   . Attends Archivist Meetings:   Marland Kitchen Marital Status:      Family History: The patient's family history includes Arrhythmia in her father; CAD in her father; COPD in her father; Cancer in her maternal aunt; Crohn's disease in her sister; Diabetes in her father and mother; Heart disease in her father; Hyperlipidemia in her father and mother; Other in her mother; Sleep apnea in her father; Thyroid disease in her father.  ROS:   Please see the history of present illness.     All other systems reviewed and are negative.  EKGs/Labs/Other Studies Reviewed:    The following studies were reviewed today:  none  EKG:  EKG is ordered today.  The ekg ordered today demonstrates sinus bradycardia HR 55,nonspecific T wave changes, stable from prior  Recent Labs: 06/18/2019: TSH 1.360 08/09/2019: BUN 17;  Creatinine, Ser 1.03; Hemoglobin 15.5; Platelets 304; Potassium 4.0; Sodium 141  Recent Lipid Panel    Component Value Date/Time   CHOL 199 06/05/2007 2012   TRIG 199 (H) 06/05/2007 2012   HDL 59 06/05/2007 2012   CHOLHDL 3.4 Ratio 06/05/2007 2012   VLDL 40 06/05/2007 2012   LDLCALC 100 (H) 06/05/2007 2012   LDLDIRECT 148 (H) 10/30/2007 0039    Physical Exam:    VS:  BP 126/80 (BP Location: Left Arm, Patient Position: Sitting, Cuff Size: Normal)  Pulse (!) 55   Ht 5\' 7"  (1.702 m)   Wt 180 lb (81.6 kg)   BMI 28.19 kg/m     Wt Readings from Last 3 Encounters:  12/13/19 180 lb (81.6 kg)  06/18/19 181 lb 12.8 oz (82.5 kg)  12/08/18 182 lb 12.8 oz (82.9 kg)     GEN: Well nourished, well developed in no acute distress HEENT: Normal NECK: No JVD; No carotid bruits LYMPHATICS: No lymphadenopathy CARDIAC: RRR, no murmurs, rubs, gallops RESPIRATORY:  Clear to auscultation without rales, wheezing or rhonchi  ABDOMEN: Soft, non-tender, non-distended MUSCULOSKELETAL:  No edema; No deformity  SKIN: Warm and dry NEUROLOGIC:  Alert and oriented x 3 PSYCHIATRIC:  Normal affect   ASSESSMENT:    1. Chest pain of uncertain etiology   2. Precordial pain   3. Coronary artery disease involving native coronary artery of native heart with angina pectoris ([place])   4. Dyslipidemia   5. Chest tightness   6. History of palpitations   7. Hypothyroidism, unspecified type   8. Pulmonary emphysema, unspecified emphysema type (HCC)    PLAN:    In order of problems listed above:  Chest pain CAD - nonobstructive by heart cath in 2009 - 20% ostial LAD stenosis, 30% stenosis in the Cx AV groove, and normal RCA; no evidence of aortic aneurysmal dilation  - she describes chest pain with typical and atypical features - this has been ramping up for the last 6 months - fortunately Dr. Stanford Breed was DOD today and I was able to discuss her case - will obtain CT coronary - no lopressor as her heart  rate is 55 - I wills tart 2.5 mg amlodipine as an attempt to help her CP - pressure is already in the 120s, she has a significant history of headaches   Palpitations - history of unremarkable heart monitor 2018 - continue BB - stable   Hypothyroidism - on synthroid - TSH and T4 last checked 05/2019   Screening - will obtain A1c and fasting lipids today   Follow up with me or Dr. Stanford Breed in 6 weeks.   Medication Adjustments/Labs and Tests Ordered: Current medicines are reviewed at length with the patient today.  Concerns regarding medicines are outlined above.  Orders Placed This Encounter  Procedures  . CT CORONARY MORPH W/CTA COR W/SCORE W/CA W/CM &/OR WO/CM  . CT CORONARY FRACTIONAL FLOW RESERVE DATA PREP  . CT CORONARY FRACTIONAL FLOW RESERVE FLUID ANALYSIS  . Lipid panel  . Basic metabolic panel  . CBC  . Hemoglobin A1c   Meds ordered this encounter  Medications  . amLODipine (NORVASC) 2.5 MG tablet    Sig: Take 1 tablet (2.5 mg total) by mouth daily.    Dispense:  30 tablet    Refill:  6    Signed, Ledora Bottcher, [place]  12/13/2019 10:17 AM    [facility]

## 2019-12-13 ENCOUNTER — Encounter: Payer: Self-pay | Admitting: Physician Assistant

## 2019-12-13 ENCOUNTER — Ambulatory Visit (INDEPENDENT_AMBULATORY_CARE_PROVIDER_SITE_OTHER): Payer: 59 | Admitting: Physician Assistant

## 2019-12-13 ENCOUNTER — Other Ambulatory Visit: Payer: Self-pay

## 2019-12-13 VITALS — BP 126/80 | HR 55 | Ht 67.0 in | Wt 180.0 lb

## 2019-12-13 DIAGNOSIS — R072 Precordial pain: Secondary | ICD-10-CM | POA: Diagnosis not present

## 2019-12-13 DIAGNOSIS — J439 Emphysema, unspecified: Secondary | ICD-10-CM

## 2019-12-13 DIAGNOSIS — R0789 Other chest pain: Secondary | ICD-10-CM

## 2019-12-13 DIAGNOSIS — E785 Hyperlipidemia, unspecified: Secondary | ICD-10-CM | POA: Diagnosis not present

## 2019-12-13 DIAGNOSIS — Z87898 Personal history of other specified conditions: Secondary | ICD-10-CM

## 2019-12-13 DIAGNOSIS — R079 Chest pain, unspecified: Secondary | ICD-10-CM | POA: Diagnosis not present

## 2019-12-13 DIAGNOSIS — I25119 Atherosclerotic heart disease of native coronary artery with unspecified angina pectoris: Secondary | ICD-10-CM

## 2019-12-13 DIAGNOSIS — E039 Hypothyroidism, unspecified: Secondary | ICD-10-CM

## 2019-12-13 MED ORDER — AMLODIPINE BESYLATE 2.5 MG PO TABS: 2.5000 mg | ORAL_TABLET | Freq: Every day | ORAL | 6 refills | Status: DC

## 2019-12-13 NOTE — Patient Instructions (Addendum)
Medication Instructions:  Start Norvasc 2.5mg  daily *If you need a refill on your cardiac medications before your next appointment, please call your pharmacy*   Lab Work: Your physician recommends that you return for lab work today (lipids, CBC, BMP, A1C) If you have labs (blood work) drawn today and your tests are completely normal, you will receive your results only by: Marland Kitchen MyChart Message (if you have MyChart) OR . A paper copy in the mail If you have any lab test that is abnormal or we need to change your treatment, we will call you to review the results.  Testing/Procedures: Coronary CTA  Follow-Up: At [facility], you and your health needs are our priority.  As part of our continuing mission to provide you with exceptional heart care, we have created designated Provider Care Teams.  These Care Teams include your primary Cardiologist (physician) and Advanced Practice Providers (APPs -  Physician Assistants and Nurse Practitioners) who all work together to provide you with the care you need, when you need it.  We recommend signing up for the patient portal called "MyChart".  Sign up information is provided on this After Visit Summary.  MyChart is used to connect with patients for Virtual Visits (Telemedicine).  Patients are able to view lab/test results, encounter notes, upcoming appointments, etc.  Non-urgent messages can be sent to your provider as well.   To learn more about what you can do with MyChart, go to [email or web address].    Your next appointment:   6 week(s)  The format for your next appointment:   In Person  Provider:   You may see one of the following providers Dr. Charlett Nose Sherron Monday   Other Instructions Your cardiac CT will be scheduled at one of the below locations:   [facility] [address] [place], [place] [address] [phone]  If scheduled at [facility], please arrive at the [facility] main entrance of  [facility] 30 minutes prior to test start time. Proceed to the [facility] Radiology Department (first floor) to check-in and test prep.  Please follow these instructions carefully (unless otherwise directed):  On the Night Before the Test: . Be sure to Drink plenty of water. . Do not consume any caffeinated/decaffeinated beverages or chocolate 12 hours prior to your test. . Do not take any antihistamines 12 hours prior to your test. . If you take Metformin do not take 24 hours prior to test.  On the Day of the Test: . Drink plenty of water. Do not drink any water within one hour of the test. . Do not eat any food 4 hours prior to the test. . You may take your regular medications prior to the test.  . FEMALES- please wear underwire-free bra if available  After the Test: . Drink plenty of water. . After receiving IV contrast, you may experience a mild flushed feeling. This is normal. . On occasion, you may experience a mild rash up to 24 hours after the test. This is not dangerous. If this occurs, you can take Benadryl 25 mg and increase your fluid intake. . If you experience trouble breathing, this can be serious. If it is severe call 911 IMMEDIATELY. If it is mild, please call our office. . If you take any of these medications: Glipizide/Metformin, Avandament, Glucavance, please do not take 48 hours after completing test unless otherwise instructed.   Once we have confirmed authorization from your insurance company, we will call you  to set up a date and time for your test.   For non-scheduling related questions, please contact the cardiac imaging nurse navigator should you have any questions/concerns: Marchia Bond, RN Navigator Cardiac Imaging [facility] [phone] office  For scheduling needs, including cancellations and rescheduling, please call [phone].

## 2019-12-14 ENCOUNTER — Other Ambulatory Visit: Payer: Self-pay | Admitting: *Deleted

## 2019-12-14 LAB — LIPID PANEL
Chol/HDL Ratio: 4.2 ratio (ref 0.0–4.4)
Cholesterol, Total: 224 mg/dL — ABNORMAL HIGH (ref 100–199)
HDL: 53 mg/dL (ref >39)
LDL Chol Calc (NIH): 149 mg/dL — ABNORMAL HIGH (ref 0–99)
Triglycerides: 125 mg/dL (ref 0–149)
VLDL Cholesterol Cal: 22 mg/dL (ref 5–40)

## 2019-12-14 LAB — CBC
Hematocrit: 46.1 % (ref 34.0–46.6)
Hemoglobin: 15.7 g/dL (ref 11.1–15.9)
MCH: 32.7 pg (ref 26.6–33.0)
MCHC: 34.1 g/dL (ref 31.5–35.7)
MCV: 96 fL (ref 79–97)
Platelets: 275 10*3/uL (ref 150–450)
RBC: 4.8 x10E6/uL (ref 3.77–5.28)
RDW: 12 % (ref 11.7–15.4)
WBC: 6.3 10*3/uL (ref 3.4–10.8)

## 2019-12-14 LAB — HEMOGLOBIN A1C
Est. average glucose Bld gHb Est-mCnc: 117 mg/dL
Hgb A1c MFr Bld: 5.7 % — ABNORMAL HIGH (ref 4.8–5.6)

## 2019-12-14 LAB — BASIC METABOLIC PANEL
BUN/Creatinine Ratio: 13 (ref 9–23)
BUN: 11 mg/dL (ref 6–24)
CO2: 22 mmol/L (ref 20–29)
Calcium: 9.5 mg/dL (ref 8.7–10.2)
Chloride: 103 mmol/L (ref 96–106)
Creatinine, Ser: 0.87 mg/dL (ref 0.57–1.00)
GFR calc Af Amer: 87 mL/min/{1.73_m2} (ref >59)
GFR calc non Af Amer: 76 mL/min/{1.73_m2} (ref >59)
Glucose: 91 mg/dL (ref 65–99)
Potassium: 4.6 mmol/L (ref 3.5–5.2)
Sodium: 141 mmol/L (ref 134–144)

## 2019-12-14 MED ORDER — ROSUVASTATIN CALCIUM 20 MG PO TABS: 20.0000 mg | ORAL_TABLET | Freq: Every day | ORAL | 3 refills | Status: DC

## 2019-12-16 ENCOUNTER — Other Ambulatory Visit (INDEPENDENT_AMBULATORY_CARE_PROVIDER_SITE_OTHER): Payer: 59

## 2019-12-16 DIAGNOSIS — R079 Chest pain, unspecified: Secondary | ICD-10-CM | POA: Diagnosis not present

## 2020-01-11 ENCOUNTER — Encounter: Payer: Self-pay | Admitting: Medical

## 2020-01-11 ENCOUNTER — Encounter: Payer: Self-pay | Admitting: Gastroenterology

## 2020-01-11 ENCOUNTER — Other Ambulatory Visit: Payer: Self-pay

## 2020-01-11 ENCOUNTER — Ambulatory Visit (INDEPENDENT_AMBULATORY_CARE_PROVIDER_SITE_OTHER): Payer: 59 | Admitting: Medical

## 2020-01-11 VITALS — BP 132/80 | HR 57 | Temp 98.1°F | Ht 67.0 in | Wt 181.2 lb

## 2020-01-11 DIAGNOSIS — K5909 Other constipation: Secondary | ICD-10-CM

## 2020-01-11 DIAGNOSIS — Z1159 Encounter for screening for other viral diseases: Secondary | ICD-10-CM

## 2020-01-11 DIAGNOSIS — I25119 Atherosclerotic heart disease of native coronary artery with unspecified angina pectoris: Secondary | ICD-10-CM

## 2020-01-11 DIAGNOSIS — R5383 Other fatigue: Secondary | ICD-10-CM

## 2020-01-11 DIAGNOSIS — Z7189 Other specified counseling: Secondary | ICD-10-CM

## 2020-01-11 DIAGNOSIS — R0602 Shortness of breath: Secondary | ICD-10-CM | POA: Insufficient documentation

## 2020-01-11 DIAGNOSIS — J309 Allergic rhinitis, unspecified: Secondary | ICD-10-CM | POA: Diagnosis not present

## 2020-01-11 DIAGNOSIS — R0609 Other forms of dyspnea: Secondary | ICD-10-CM

## 2020-01-11 DIAGNOSIS — M2550 Pain in unspecified joint: Secondary | ICD-10-CM

## 2020-01-11 DIAGNOSIS — M549 Dorsalgia, unspecified: Secondary | ICD-10-CM

## 2020-01-11 DIAGNOSIS — F419 Anxiety disorder, unspecified: Secondary | ICD-10-CM

## 2020-01-11 DIAGNOSIS — E039 Hypothyroidism, unspecified: Secondary | ICD-10-CM

## 2020-01-11 DIAGNOSIS — N951 Menopausal and female climacteric states: Secondary | ICD-10-CM | POA: Diagnosis not present

## 2020-01-11 DIAGNOSIS — K589 Irritable bowel syndrome without diarrhea: Secondary | ICD-10-CM | POA: Diagnosis not present

## 2020-01-11 DIAGNOSIS — Z7185 Encounter for immunization safety counseling: Secondary | ICD-10-CM

## 2020-01-11 DIAGNOSIS — Z1211 Encounter for screening for malignant neoplasm of colon: Secondary | ICD-10-CM

## 2020-01-11 DIAGNOSIS — R0600 Dyspnea, unspecified: Secondary | ICD-10-CM

## 2020-01-11 DIAGNOSIS — G8929 Other chronic pain: Secondary | ICD-10-CM

## 2020-01-11 DIAGNOSIS — Z87898 Personal history of other specified conditions: Secondary | ICD-10-CM

## 2020-01-11 DIAGNOSIS — F17209 Nicotine dependence, unspecified, with unspecified nicotine-induced disorders: Secondary | ICD-10-CM

## 2020-01-11 DIAGNOSIS — Z1231 Encounter for screening mammogram for malignant neoplasm of breast: Secondary | ICD-10-CM

## 2020-01-11 DIAGNOSIS — R0683 Snoring: Secondary | ICD-10-CM

## 2020-01-11 DIAGNOSIS — J439 Emphysema, unspecified: Secondary | ICD-10-CM

## 2020-01-11 DIAGNOSIS — F321 Major depressive disorder, single episode, moderate: Secondary | ICD-10-CM

## 2020-01-11 DIAGNOSIS — R0789 Other chest pain: Secondary | ICD-10-CM

## 2020-01-11 DIAGNOSIS — Z8249 Family history of ischemic heart disease and other diseases of the circulatory system: Secondary | ICD-10-CM

## 2020-01-11 DIAGNOSIS — E559 Vitamin D deficiency, unspecified: Secondary | ICD-10-CM

## 2020-01-11 DIAGNOSIS — Z0000 Encounter for general adult medical examination without abnormal findings: Secondary | ICD-10-CM | POA: Diagnosis not present

## 2020-01-11 HISTORY — DX: Other constipation: K59.09

## 2020-01-11 HISTORY — DX: Dyspnea, unspecified: R06.00

## 2020-01-11 HISTORY — DX: Other forms of dyspnea: R06.09

## 2020-01-11 NOTE — Patient Instructions (Addendum)
Recommendations:  See your eye doctor yearly for routine vision care.  See your dentist yearly for routine dental care including hygiene visits twice yearly.   Vaccine recommendations: I recommend the Covid vaccine now, 2 shots 1 month apart.  Then wait 3-4 weeks before any other vaccine.  Other vaccines recommended include Tetanus every 10 years, Pneumococcal 23 vaccine once, and the Shingrix vaccine.  The Shingrix vaccine should be done separately just like the Covid vaccine.  You can get the Tetanus and Pneumooccal 23 the same day.     So I would recommend Covid vaccine first, wait 3 weeks after 2nd dose, then start the Shingrix 2 shot series.      Cancer screening: You noted mammogram and pap 2020.  We will refer to gastroentoerlogy for evaluation of constipaoitn and colon cancer screening   Heart disease screening Follow up as planned with cardiology for heart scan    Pending labs, I will offer recommendations regarding medication for mood, anxiety.

## 2020-01-11 NOTE — Addendum Note (Signed)
Addended by: Edgar Frisk on: 01/11/2020 09:52 AM   Modules accepted: Orders

## 2020-01-11 NOTE — Progress Notes (Signed)
Subjective:   HPI  Mary Escobar is a 54 y.o. female who presents for Chief Complaint  Patient presents with  . Annual Exam    with fasting labs     Patient Care Team: Erubiel Manasco, Camelia Eng, PA-C as PCP - General (Family Medicine) Stanford Breed Denice Bors, MD as PCP - Cardiology (Cardiology) Sees dentist Sees eye doctor  [facility]  Concerns: Seeing cardiology, has heart scan pending soon.   Saw them recently for some chest pain.  Had labs there as well.  Was started on cholesterol medication.  She is compliant with the cholesterol medication.  Cardiology also started her on Amlodipine, but feels some constipation.  She already has hx/o constipation and IBS.   She stopped the amlodipine.   No prior colonoscopy.    COPD - uses Anoro most days for prevention.  Doesn't feel the same opening of the lungs she was having intially with Anoro.   Had tried Symbicort but cant recall how well it did. Last used Anoro yesterday.    Stopped OCP 08/2019.  Hasn't been on contraception since then to be able to go in and have testing to check for menopause.   Been with same partner x 3 years.  No concern for STD.    Been under a lot of stress.  Dealing with some issues with anger, has some issues with sister she is dealing with.   Taking Wellbutrin, but doesn't feel this is strong enough.   Not seeing a therapist currently.   Hasn't done well with counselor in the past.   Dealing with family stress.   Also wonders about hormones, menopause.  Wants to try something to keep her more relaxed.   Reviewed their medical, surgical, family, social, medication, and allergy history and updated chart as appropriate.  Past Medical History:  Diagnosis Date  . Family history of premature CAD   . Fatigue 2020   multifactoral  . Hypothyroid   . IBS (irritable bowel syndrome)   . mild nonobstructive CAD on cath 2009   . Palpitation   . Smoker   . Vitamin D deficiency     Past Surgical History:  Procedure  Laterality Date  . CORONARY ANGIOGRAM  2009   mild CAD noted after abn GXT  . WRIST SURGERY      Social History   Socioeconomic History  . Marital status: Significant Other    Spouse name: Not on file  . Number of children: 1  . Years of education: Not on file  . Highest education level: Not on file  Occupational History  . Not on file  Tobacco Use  . Smoking status: Current Every Day Smoker    Packs/day: 0.50    Types: Cigarettes  . Smokeless tobacco: Never Used  Substance and Sexual Activity  . Alcohol use: No    Alcohol/week: 0.0 standard drinks  . Drug use: No  . Sexual activity: Not on file  Other Topics Concern  . Not on file  Social History Narrative  . Not on file   Social Determinants of Health   Financial Resource Strain:   . Difficulty of Paying Living Expenses:   Food Insecurity:   . Worried About Charity fundraiser in the Last Year:   . Arboriculturist in the Last Year:   Transportation Needs:   . Film/video editor (Medical):   Marland Kitchen Lack of Transportation (Non-Medical):   Physical Activity:   . Days of Exercise per  Week:   . Minutes of Exercise per Session:   Stress:   . Feeling of Stress :   Social Connections:   . Frequency of Communication with Friends and Family:   . Frequency of Social Gatherings with Friends and Family:   . Attends Religious Services:   . Active Member of Clubs or Organizations:   . Attends Archivist Meetings:   Marland Kitchen Marital Status:   Intimate Partner Violence:   . Fear of Current or Ex-Partner:   . Emotionally Abused:   Marland Kitchen Physically Abused:   . Sexually Abused:     Family History  Problem Relation Age of Onset  . CAD Father        MI at age 39  . Heart disease Father        CABG x 3  . Sleep apnea Father   . Arrhythmia Father        pacemaker  . COPD Father   . Diabetes Father   . Thyroid disease Father   . Hyperlipidemia Father   . Diabetes Mother   . Other Mother        carcinoid tumor  .  Hyperlipidemia Mother   . Crohn's disease Sister   . Cancer Maternal Aunt        colon     Current Outpatient Medications:  .  ASPIRIN 81 PO, Take 81 mg by mouth daily., Disp: , Rfl:  .  buPROPion (WELLBUTRIN XL) 300 MG 24 hr tablet, Take 1 tablet (300 mg total) by mouth daily., Disp: 90 tablet, Rfl: 0 .  cholecalciferol (VITAMIN D3) 25 MCG (1000 UT) tablet, Take 1 tablet (1,000 Units total) by mouth daily., Disp: 90 tablet, Rfl: 3 .  ELDERBERRY PO, Take by mouth daily., Disp: , Rfl:  .  levothyroxine (SYNTHROID) 88 MCG tablet, TK 1 T PO QD UTD, Disp: 90 tablet, Rfl: 1 .  metoprolol succinate (TOPROL-XL) 25 MG 24 hr tablet, TAKE 1 TABLET(25 MG) BY MOUTH DAILY, Disp: 90 tablet, Rfl: 0 .  rosuvastatin (CRESTOR) 20 MG tablet, Take 1 tablet (20 mg total) by mouth daily., Disp: 90 tablet, Rfl: 3 .  umeclidinium-vilanterol (ANORO ELLIPTA) 62.5-25 MCG/INH AEPB, Inhale 1 puff into the lungs daily., Disp: 30 each, Rfl: 2 .  amLODipine (NORVASC) 2.5 MG tablet, Take 1 tablet (2.5 mg total) by mouth daily. (Patient not taking: Reported on 01/11/2020), Disp: 30 tablet, Rfl: 6 .  nitroGLYCERIN (NITROSTAT) 0.4 MG SL tablet, Place 1 tablet (0.4 mg total) under the tongue every 5 (five) minutes as needed for chest pain. (Patient not taking: Reported on 01/11/2020), Disp: 25 tablet, Rfl: 4 .  oxyCODONE-acetaminophen (PERCOCET/ROXICET) 5-325 MG tablet, Take 1-2 tablets by mouth every 6 (six) hours as needed for severe pain. (Patient not taking: Reported on 01/11/2020), Disp: 15 tablet, Rfl: 0  Allergies  Allergen Reactions  . Citalopram Palpitations  . Amlodipine     constipation  . Ciprofloxacin Nausea And Vomiting  . Sulfonamide Derivatives     REACTION: unknown rx  . Sulfa Antibiotics Rash     Review of Systems Constitutional: -fever, -chills, -sweats, -unexpected weight change, -decreased appetite, -fatigue Allergy: -sneezing, -itching, -congestion Dermatology: -changing moles, --rash, -lumps ENT:  -runny nose, -ear pain, -sore throat, -hoarseness, -sinus pain, -teeth pain, - ringing in ears, -hearing loss, -nosebleeds Cardiology: +chest pain, -palpitations, -swelling, -difficulty breathing when lying flat, -waking up short of breath Respiratory: -cough, +shortness of breath, -difficulty breathing with exercise or exertion, -wheezing, -coughing up blood Gastroenterology: -  abdominal pain, -nausea, -vomiting, -diarrhea, +constipation, -blood in stool, -changes in bowel movement, -difficulty swallowing or eating Hematology: -bleeding, -bruising  Musculoskeletal: -joint aches, -muscle aches, -joint swelling, -back pain, -neck pain, -cramping, -changes in gait Ophthalmology: denies vision changes, eye redness, itching, discharge Urology: -burning with urination, -difficulty urinating, -blood in urine, -urinary frequency, -urgency, -incontinence Neurology: -headache, -weakness, -tingling, -numbness, -memory loss, -falls, -dizziness Psychology: +depressed mood, -agitation, -sleep problems Breast/gyn: -breast tendnerss, -discharge, -lumps, -vaginal discharge,- irregular periods, -heavy periods     Objective:  BP 132/80   Pulse (!) 57   Temp 98.1 F (36.7 C)   Ht 5\' 7"  (1.702 m)   Wt 181 lb 3.2 oz (82.2 kg)   SpO2 97%   BMI 28.38 kg/m   General appearance: alert, no distress, WD/WN, Caucasian female Skin: no worrisome lesions Neck: supple, no lymphadenopathy, no thyromegaly, no masses, normal ROM, no bruits Chest: non tender, normal shape and expansion Heart: RRR, normal S1, S2, no murmurs Lungs: CTA bilaterally, no wheezes, rhonchi, or rales Abdomen: +bs, soft, non tender, non distended, no masses, no hepatomegaly, no splenomegaly, no bruits Back: non tender, normal ROM, no scoliosis Musculoskeletal: upper extremities non tender, no obvious deformity, normal ROM throughout, lower extremities non tender, no obvious deformity, normal ROM throughout Extremities: no edema, no cyanosis, no  clubbing Pulses: 2+ symmetric, upper and lower extremities, normal cap refill Neurological: alert, oriented x 3, CN2-12 intact, strength normal upper extremities and lower extremities, sensation normal throughout, DTRs 2+ throughout, no cerebellar signs, gait normal Psychiatric: normal affect, behavior normal, pleasant  Breast/gyn/rectal - deferred to gynecology    Assessment and Plan :   Encounter Diagnoses  Name Primary?  . Encounter for health maintenance examination in adult Yes  . Coronary artery disease involving native coronary artery of native heart with angina pectoris ([place])   . Allergic rhinitis, unspecified seasonality, unspecified trigger   . Pulmonary emphysema, unspecified emphysema type ([place])   . Irritable bowel syndrome, unspecified type   . Hypothyroidism, unspecified type   . Anxiety   . History of palpitations   . Nicotine dependence with nicotine-induced disorder, unspecified nicotine product type   . Vitamin D deficiency   . Polyarthralgia   . Chronic bilateral back pain, unspecified back location   . Family history of premature CAD   . Snoring   . Fatigue, unspecified type   . Chest tightness   . DOE (dyspnea on exertion)   . Vaccine counseling   . Encounter for screening mammogram for malignant neoplasm of breast   . Screen for colon cancer   . Depression, major, single episode, moderate ([place])   . Encounter for hepatitis C screening test for low risk patient   . Chronic constipation   . Perimenopausal     Physical exam - discussed and counseled on healthy lifestyle, diet, exercise, preventative care, vaccinations, sick and well care, proper use of emergency dept and after hours care, and addressed their concerns.    Health screening: Advised they see their eye doctor yearly for routine vision care. Advised they see their dentist yearly for routine dental care including hygiene visits twice yearly. See your gynecologist yearly for routine gynecological  care.   Cancer screening Counseled on self breast exams, mammograms, cervical cancer screening  Colonoscopy:  Referred for screening colonoscopy  Will request records for pap and mammogram from gynecology from 2020   Vaccinations: I recommend the Covid vaccine now, 2 shots 1 month apart.  Then wait 3-4 weeks before  any other vaccine.  Other vaccines recommended include Tetanus every 10 years, Pneumococcal 23 vaccine once, and the Shingrix vaccine.  The Shingrix vaccine should be done separately just like the Covid vaccine.  You can get the Tetanus and Pneumococcal 23 the same day.     So I would recommend Covid vaccine first, wait 3 weeks after 2nd dose, then start the Shingrix 2 shot series.   Acute issues discussed: none  Separate significant chronic issues discussed: Chronic constipation, recently worsened by amlodipine which she stopped.  Referral to gastro for colonoscopy and eval of constipation chronic history as well as obvious  Depression, anxiety, family stress-discussed current and past treatment.  She declines counseling says she did not do well with this in the past.  She did not see a big response with Effexor in 2020.  I advised that she is on the highest dose I feel comfortable with Wellbutrin.  Pending labs consider adding SSRI or adding buspirone or anxiolytic  COPD-advised to stop tobacco.  She continues to smoke.  Hypothyroidism - labs today, then adjust thyroid medication accordingly  F/u with cardiology as planned for medication recheck and heart scan  Aortic atherosclerosis, CAD - c/t statin just recently started  Vit D deficiency - labs today, c/t supplement  perimenopausal - screen lab today   Jency was seen today for annual exam.  Diagnoses and all orders for this visit:  Encounter for health maintenance examination in adult -     TSH -     T4, free -     VITAMIN D 25 Hydroxy (Vit-D Deficiency, Fractures) -     Hepatitis C antibody -      Follicle stimulating hormone  Coronary artery disease involving native coronary artery of native heart with angina pectoris (HCC)  Allergic rhinitis, unspecified seasonality, unspecified trigger  Pulmonary emphysema, unspecified emphysema type (HCC)  Irritable bowel syndrome, unspecified type  Hypothyroidism, unspecified type -     TSH -     T4, free  Anxiety  History of palpitations  Nicotine dependence with nicotine-induced disorder, unspecified nicotine product type  Vitamin D deficiency -     VITAMIN D 25 Hydroxy (Vit-D Deficiency, Fractures)  Polyarthralgia  Chronic bilateral back pain, unspecified back location  Family history of premature CAD  Snoring  Fatigue, unspecified type  Chest tightness  DOE (dyspnea on exertion)  Vaccine counseling  Encounter for screening mammogram for malignant neoplasm of breast  Screen for colon cancer  Depression, major, single episode, moderate ([place])  Encounter for hepatitis C screening test for low risk patient -     Hepatitis C antibody  Chronic constipation  Perimenopausal -     Follicle stimulating hormone    Follow-up pending labs, yearly for physical

## 2020-01-12 ENCOUNTER — Other Ambulatory Visit: Payer: Self-pay | Admitting: Medical

## 2020-01-12 LAB — TSH: TSH: 0.266 u[IU]/mL — ABNORMAL LOW (ref 0.450–4.500)

## 2020-01-12 LAB — FOLLICLE STIMULATING HORMONE: FSH: 48.7 m[IU]/mL

## 2020-01-12 LAB — T4, FREE: Free T4: 1.55 ng/dL (ref 0.82–1.77)

## 2020-01-12 LAB — HEPATITIS C ANTIBODY: Hep C Virus Ab: <0.1 s/co ratio (ref 0.0–0.9)

## 2020-01-12 LAB — VITAMIN D 25 HYDROXY (VIT D DEFICIENCY, FRACTURES): Vit D, 25-Hydroxy: 20.4 ng/mL — ABNORMAL LOW (ref 30.0–100.0)

## 2020-01-12 MED ORDER — CLONAZEPAM 0.5 MG PO TABS: 0.5000 mg | ORAL_TABLET | Freq: Every day | ORAL | 0 refills | Status: DC

## 2020-01-12 MED ORDER — LEVOTHYROXINE SODIUM 75 MCG PO TABS: 75.0000 ug | ORAL_TABLET | Freq: Every day | ORAL | 2 refills | Status: DC

## 2020-01-12 MED ORDER — ALBUTEROL SULFATE HFA 108 (90 BASE) MCG/ACT IN AERS: 2.0000 | INHALATION_SPRAY | Freq: Four times a day (QID) | RESPIRATORY_TRACT | 0 refills | Status: DC | PRN

## 2020-01-12 MED ORDER — MOMETASONE FURO-FORMOTEROL FUM 200-5 MCG/ACT IN AERO: 2.0000 | INHALATION_SPRAY | Freq: Two times a day (BID) | RESPIRATORY_TRACT | 0 refills | Status: DC

## 2020-01-12 MED ORDER — BUPROPION HCL ER (XL) 300 MG PO TB24: 300.0000 mg | ORAL_TABLET | Freq: Every day | ORAL | 0 refills | Status: DC

## 2020-01-12 MED ORDER — VITAMIN D 25 MCG (1000 UNIT) PO TABS: 2000.0000 [IU] | ORAL_TABLET | Freq: Every day | ORAL | 3 refills | Status: DC

## 2020-01-14 ENCOUNTER — Telehealth: Payer: Self-pay | Admitting: Medical

## 2020-01-14 NOTE — Telephone Encounter (Signed)
Received requested records from [facility]  

## 2020-01-17 ENCOUNTER — Other Ambulatory Visit: Payer: Self-pay | Admitting: Medical

## 2020-01-17 ENCOUNTER — Telehealth: Payer: Self-pay

## 2020-01-17 MED ORDER — TRELEGY ELLIPTA 100-62.5-25 MCG/INH IN AEPB: 1.0000 | INHALATION_SPRAY | Freq: Every day | RESPIRATORY_TRACT | 2 refills | Status: DC

## 2020-01-17 NOTE — Telephone Encounter (Signed)
Lets try Trelegy.  Let her know that insurance is declining coverage so lets try Trelegy when she runs out of current therapy

## 2020-01-17 NOTE — Telephone Encounter (Signed)
I have submitted a PA for the pts. Dulera and it was denied by the insurance pt. Has to have tried and failed two other covered alternatives. The covered alternatives are Advair Diskus, Adair Patter, trelegy Ellipta, or Ipratropium-Albuterol Nebulizer solution.

## 2020-01-18 ENCOUNTER — Telehealth: Payer: Self-pay

## 2020-01-18 NOTE — Telephone Encounter (Signed)
I thought I had put that in the note ,but yes I lowered down to 68mcg daily.  She was on 21mcg daily.   The plan would be to repeat lab in 4-6 weeks.

## 2020-01-18 NOTE — Telephone Encounter (Signed)
Pt. Aware of change in medication to trelegy.

## 2020-01-18 NOTE — Telephone Encounter (Signed)
Patient called to inquire whether there would be a dosage change since her thyroid came back over corrected. Please advise

## 2020-01-19 ENCOUNTER — Telehealth: Payer: Self-pay

## 2020-01-19 NOTE — Telephone Encounter (Signed)
Patient wants to know is she nearing the end of her menopause? If not where is she, beginning stage, mid or end. Please advise.

## 2020-01-19 NOTE — Telephone Encounter (Signed)
Left detail message on machine    

## 2020-01-19 NOTE — Telephone Encounter (Signed)
Most women enter on average age 54yo, so she may just be in the early parts.   Some women have menopausal symptoms for a few years, some several years.  Every body is different in this regard

## 2020-01-20 NOTE — Telephone Encounter (Signed)
Patient has been informed of provider message.  

## 2020-01-21 ENCOUNTER — Encounter: Payer: Self-pay | Admitting: Medical

## 2020-01-24 ENCOUNTER — Telehealth: Payer: Self-pay

## 2020-01-24 ENCOUNTER — Ambulatory Visit: Payer: 59 | Admitting: Cardiology

## 2020-01-24 NOTE — Telephone Encounter (Signed)
I submitted an appeal per the pts. Request for her Mary Escobar and it was approved from 01/21/20-01/20/21. So if you want her to go back on the [facility] you can do so.

## 2020-01-24 NOTE — Telephone Encounter (Signed)
Please let her know but I think for now we are using Trelegy inhaler

## 2020-01-25 ENCOUNTER — Telehealth: Payer: Self-pay

## 2020-01-25 NOTE — Telephone Encounter (Signed)
Informed patient that notes has been sent to Dr. Michail Sermon office and she stated she will call and schedule an appointment.

## 2020-01-25 NOTE — Telephone Encounter (Signed)
Pt. Aware that Mary Escobar is now covered by the insurance see is going to the pharmacy this afternoon and will compare prices on the trelegy to the dulera.

## 2020-01-25 NOTE — Telephone Encounter (Signed)
Pt. Called stating that she had a referral done to GI for a colonoscopy and has the pre apt. There already. She would like to make sure this is scheduled with Dr. Michail Sermon.

## 2020-01-27 ENCOUNTER — Telehealth (HOSPITAL_COMMUNITY): Payer: Self-pay | Admitting: Emergency Medicine

## 2020-01-27 NOTE — Telephone Encounter (Signed)
Reaching out to patient to offer assistance regarding upcoming cardiac imaging study; pt verbalizes understanding of appt date/time, parking situation and where to check in, pre-test NPO status and medications ordered, and verified current allergies; name and call back number provided for further questions should they arise Chrishon Martino RN Navigator Cardiac Imaging [facility] [phone] office [phone] cell 

## 2020-01-28 ENCOUNTER — Encounter (HOSPITAL_COMMUNITY): Payer: Self-pay

## 2020-01-28 ENCOUNTER — Other Ambulatory Visit: Payer: Self-pay

## 2020-01-28 ENCOUNTER — Ambulatory Visit (HOSPITAL_COMMUNITY)
Admission: RE | Admit: 2020-01-28 | Discharge: 2020-01-28 | Disposition: A | Discharge status: Home / Self Care | Payer: 59 | Source: Ambulatory Visit | Attending: Physician Assistant | Admitting: Physician Assistant

## 2020-01-28 DIAGNOSIS — R072 Precordial pain: Secondary | ICD-10-CM | POA: Diagnosis present

## 2020-01-28 DIAGNOSIS — R079 Chest pain, unspecified: Secondary | ICD-10-CM | POA: Insufficient documentation

## 2020-01-28 IMAGING — CT CT HEART MORP W/ CTA COR W/ SCORE W/ CA W/CM &/OR W/O CM
4 of 7 series · 8 of 20 positions shown, 9 images · non-contrast
Comparison: None.
COMPARISON: None.

Addendum:
EXAM:
OVER-READ INTERPRETATION  CT CHEST

The following report is an over-read performed by radiologist Dr.
CEOLA [REDACTED] on [DATE]. This
over-read does not include interpretation of cardiac or coronary
anatomy or pathology. The coronary calcium score/coronary CTA
interpretation by the cardiologist is attached.
CLINICAL DATA: 53F with CAD and tobacco abuse.
Cardiac/Coronary  CT
TECHNIQUE: The patient was scanned on a [employer] Force scanner.

[Series 6: best diast 74 % · axial · 0.39mm/px · z∈[-162,-118]mm · 2 of 331 slices shown]
[im 111/331  vessel]
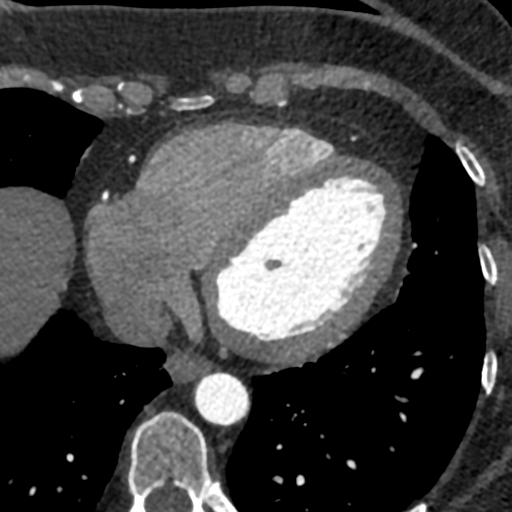
[im 221/331  vessel]
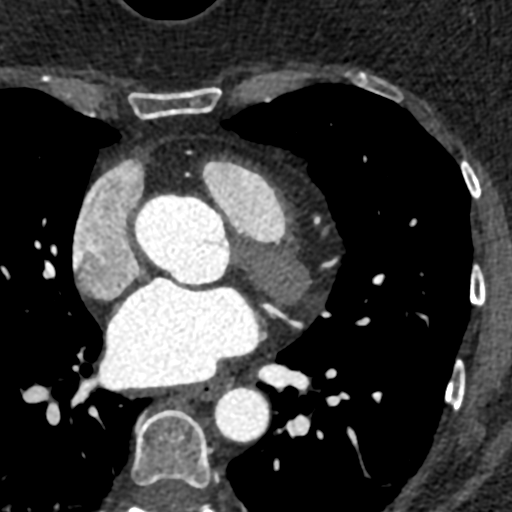

[Series 7: best syst · axial · 0.39mm/px · z∈[-162,-118]mm · 2 of 331 slices shown, 3 images]
[im 111/331  vessel]
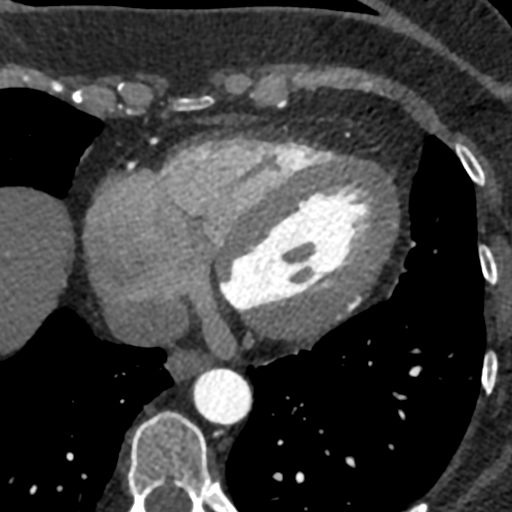
[im 111/331  lung]
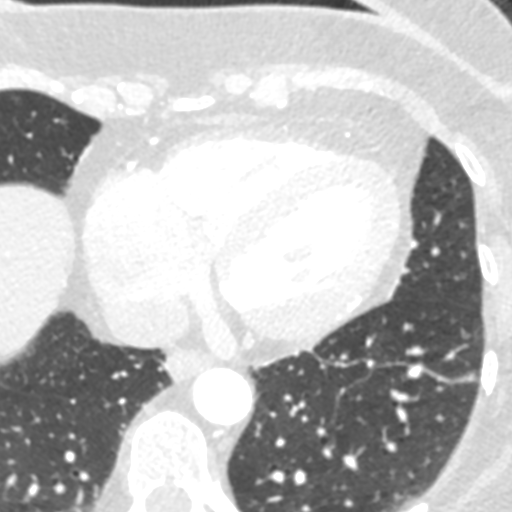
[im 221/331  vessel]
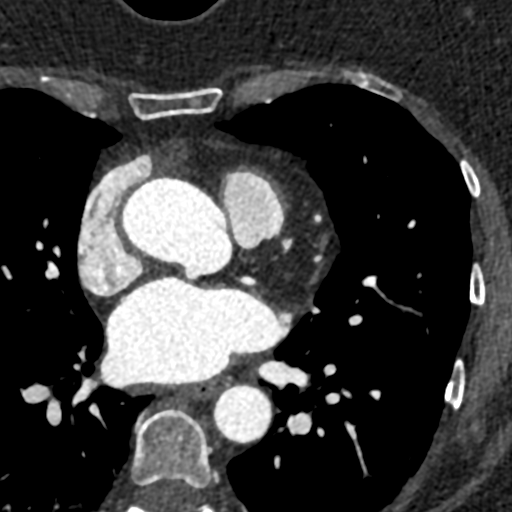

[Series 9: ts diast sharp · axial · 0.39mm/px · z∈[-162,-118]mm · 2 of 331 slices shown]
[im 111/331  lung]
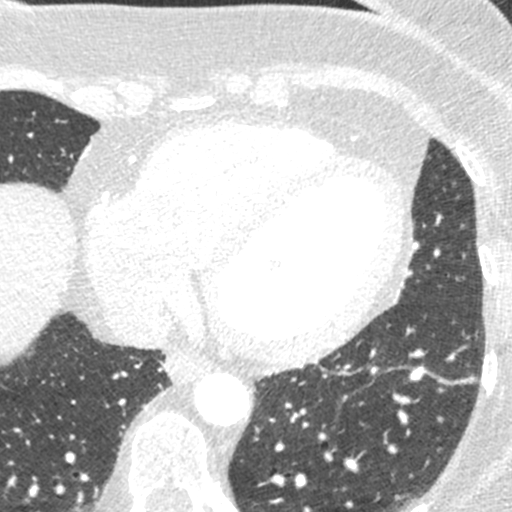
[im 221/331  lung]
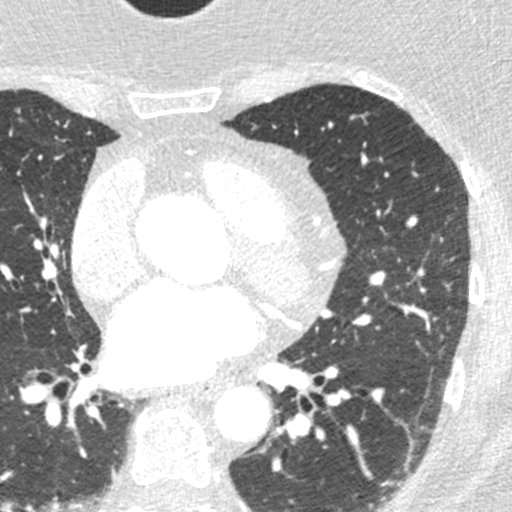

[Series 10: ts syst sharp · axial · 0.39mm/px · z∈[-162,-118]mm · 2 of 331 slices shown]
[im 111/331  lung]
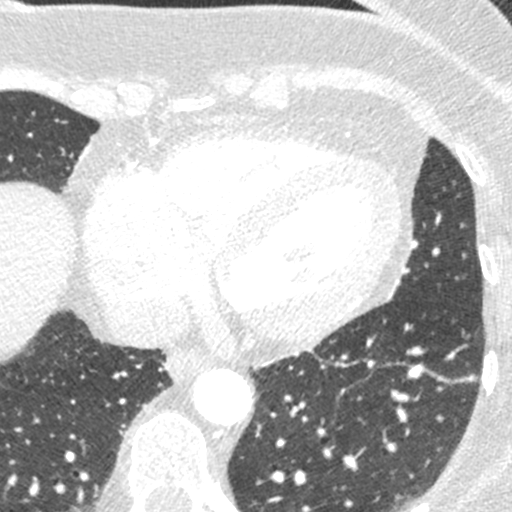
[im 221/331  lung]
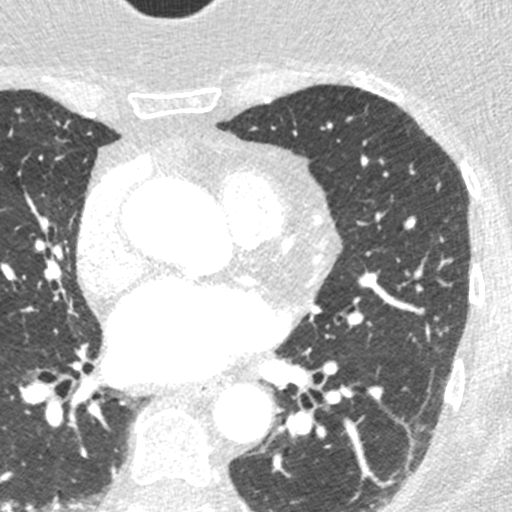

[8 of 20 positions shown; findings below may reference images not displayed]

FINDINGS: Ectasia of ascending thoracic aorta (4.0 cm in diameter). Within the
visualized portions of the thorax there are no suspicious appearing
pulmonary nodules or masses, there is no acute consolidative
airspace disease, no pleural effusions, no pneumothorax and no
lymphadenopathy. Visualized portions of the upper abdomen are
unremarkable. There are no aggressive appearing lytic or blastic
lesions noted in the visualized portions of the skeleton.
IMPRESSION: 1. Ectasia of ascending thoracic aorta (4.0 cm in diameter).
Recommend annual imaging followup by CTA or MRA. This recommendation
follows [ZI] ACCF/AHA/AATS/ACR/ASA/SCA/CEOLA/CEOLA/CEOLA/CEOLA Guidelines
for the Diagnosis and Management of Patients with Thoracic Aortic
Disease. Circulation. [ZI]; 121: [id number]. Aortic aneurysm NOS
([ZI]-[ZI]).
FINDINGS: A 120 kV prospective scan was triggered in the descending thoracic
aorta at 111 HU's. Axial non-contrast 3 mm slices were carried out
through the heart. The data set was analyzed on a dedicated work
station and scored using the Agatson method. Gantry rotation speed
was 250 msecs and collimation was .6 mm. No beta blockade and 0.8 mg
of sl NTG was given. The 3D data set was reconstructed in 5%
intervals of the 67-82 % of the R-R cycle. Diastolic phases were
analyzed on a dedicated work station using MPR, MIP and VRT modes.
The patient received 80 cc of contrast.

Aorta: Mildly dilated. Ascending aorta 3.6 cm. No calcifications. No
dissection.

Aortic Valve:  Trileaflet.  No calcifications.

Coronary Arteries:  Normal coronary origin.  Right dominance.

RCA is a large dominant artery that gives rise to PDA and PLVB.
There is no plaque.

Left main is a large artery that gives rise to LAD and LCX arteries.

LAD is a large vessel. There is mild (25-49%) calcified plaque in
the proximal LAD. There is minimal (<25%) low attenuation plaque in
the mid LAD.

LCX is a non-dominant artery that gives rise to one large OM1
branch. There is no plaque. The LCX is small distal to OM1.

Other findings:

Normal pulmonary vein drainage into the left atrium.

Normal let atrial appendage without a thrombus.

Normal size of the pulmonary artery.
IMPRESSION: 1. Coronary calcium score of 156. This was 96th percentile for age
and sex matched control.

2. Normal coronary origin with right dominance.

3.  Mild (25-49%) calcified plaque in the LAD.  ([ZI]).

4.  Will send study for FFRct.

*** End of Addendum ***
EXAM:
OVER-READ INTERPRETATION  CT CHEST

The following report is an over-read performed by radiologist Dr.
CEOLA [REDACTED] on [DATE]. This
over-read does not include interpretation of cardiac or coronary
anatomy or pathology. The coronary calcium score/coronary CTA
interpretation by the cardiologist is attached.
FINDINGS: Ectasia of ascending thoracic aorta (4.0 cm in diameter). Within the
visualized portions of the thorax there are no suspicious appearing
pulmonary nodules or masses, there is no acute consolidative
airspace disease, no pleural effusions, no pneumothorax and no
lymphadenopathy. Visualized portions of the upper abdomen are
unremarkable. There are no aggressive appearing lytic or blastic
lesions noted in the visualized portions of the skeleton.
IMPRESSION: 1. Ectasia of ascending thoracic aorta (4.0 cm in diameter).
Recommend annual imaging followup by CTA or MRA. This recommendation
follows [ZI] ACCF/AHA/AATS/ACR/ASA/SCA/CEOLA/CEOLA/CEOLA/CEOLA Guidelines
for the Diagnosis and Management of Patients with Thoracic Aortic
Disease. Circulation. [ZI]; 121: [id number]. Aortic aneurysm NOS
([ZI]-[ZI]).

## 2020-01-28 MED ORDER — NITROGLYCERIN 0.4 MG SL SUBL
SUBLINGUAL_TABLET | SUBLINGUAL | Status: AC
  Filled 2020-01-28: qty 2

## 2020-01-28 MED ORDER — NITROGLYCERIN 0.4 MG SL SUBL
0.8000 mg | SUBLINGUAL_TABLET | Freq: Once | SUBLINGUAL | Status: AC
  Administered 2020-01-28: 0.8 mg via SUBLINGUAL

## 2020-01-28 MED ORDER — IOHEXOL 350 MG/ML SOLN
80.0000 mL | Freq: Once | INTRAVENOUS | Status: AC | PRN
  Administered 2020-01-28: 80 mL via INTRAVENOUS

## 2020-01-29 ENCOUNTER — Ambulatory Visit (HOSPITAL_COMMUNITY)
Admission: RE | Admit: 2020-01-29 | Discharge: 2020-01-29 | Disposition: A | Discharge status: Home / Self Care | Payer: 59 | Source: Ambulatory Visit | Attending: Physician Assistant | Admitting: Physician Assistant

## 2020-01-29 DIAGNOSIS — R072 Precordial pain: Secondary | ICD-10-CM | POA: Diagnosis present

## 2020-01-29 DIAGNOSIS — R079 Chest pain, unspecified: Secondary | ICD-10-CM | POA: Insufficient documentation

## 2020-01-31 DIAGNOSIS — R072 Precordial pain: Secondary | ICD-10-CM | POA: Diagnosis not present

## 2020-01-31 NOTE — Progress Notes (Signed)
Cardiology Office Note   Date:  02/02/2020   ID:  Mary Escobar, DOB December 03, 1965, MRN [id number]  PCP:  Carlena Hurl, PA-C  Cardiologist:  Kirk Ruths, MD EP: None  Chief Complaint  Patient presents with  . Follow-up    chest pain      History of Present Illness: Mary Escobar is a 54 y.o. female with PMH of non-obstructive CAD, COPD, and tobacco abuse, who presents for follow-up of her chest pain  She was last evaluated by cardiology at an outpatient visit with Doreene Adas, PA-C 12/13/19 for follow-up of worsening chest pain which sounded atypical in nature. She was started on amlodipine 2.5mg  daily for antianginal effects. Lipids revealed LDL 149 and A1C was 5.7. She was subsequently recommended to start crestor 20mg  daily for risk factor modifications. She underwent a coronary CTA 01/29/20 which showed mild (25-49%) calcified plaque in the LAD, sent for FFR which showed no hemodynamically significant stenosis.  Her calcium score was 156, placing her in the 96th percentile for age/sex match. Prior to this she had a cardiac catheterization in 2009 following an abnormal treadmill stress test which revealed non-obstructive CAD - 20% ostial LAD stenosis, and 30% LCx disease.  She presents today for follow-up of her chest pain. She reports improvement in symptoms since her last visit. Still having occasional fleeting atypical chest pains, mostly at rest. She has also had improvement in her leg pain reported at her last visit. She has rare palpitations which are well managed with metoprolol. No complaints of dizziness, lightheadedness, syncope, or LE edema. She is still smoking 1/2 ppd. She has quit cold [place] before but restarted after 2 years when stress level increased. We discussed her recent coronary CTA results in detail and importance of risk factor modifications to prevent progression of disease including BP goal <130/80, LDL goal <70, A1C <7, and tobacco cessation.    Past  Medical History:  Diagnosis Date  . Family history of premature CAD   . Fatigue 2020   multifactoral  . Hypothyroid   . IBS (irritable bowel syndrome)   . mild nonobstructive CAD on cath 2009   . Palpitation   . Smoker   . Vitamin D deficiency     Past Surgical History:  Procedure Laterality Date  . CORONARY ANGIOGRAM  2009   mild CAD noted after abn GXT  . WRIST SURGERY       Current Outpatient Medications  Medication Sig Dispense Refill  . albuterol (VENTOLIN HFA) 108 (90 Base) MCG/ACT inhaler INHALE 2 PUFFS INTO THE LUNGS EVERY 6 HOURS AS NEEDED FOR WHEEZING OR SHORTNESS OF BREATH 54 g 0  . amLODipine (NORVASC) 5 MG tablet Take 1 tablet (5 mg total) by mouth daily. 90 tablet 3  . ASPIRIN 81 PO Take 81 mg by mouth daily.    Marland Kitchen buPROPion (WELLBUTRIN XL) 300 MG 24 hr tablet Take 1 tablet (300 mg total) by mouth daily. 90 tablet 0  . cholecalciferol (VITAMIN D3) 25 MCG (1000 UNIT) tablet Take 2 tablets (2,000 Units total) by mouth daily. 180 tablet 3  . clonazePAM (KLONOPIN) 0.5 MG tablet Take 1 tablet (0.5 mg total) by mouth daily. 30 tablet 0  . ELDERBERRY PO Take by mouth daily.    . Fluticasone-Umeclidin-Vilant (TRELEGY ELLIPTA) 100-62.5-25 MCG/INH AEPB Inhale 1 puff into the lungs daily. 28 each 2  . levothyroxine (SYNTHROID) 75 MCG tablet Take 1 tablet (75 mcg total) by mouth daily. 30 tablet 2  .  metoprolol succinate (TOPROL-XL) 25 MG 24 hr tablet TAKE 1 TABLET(25 MG) BY MOUTH DAILY 90 tablet 0  . nitroGLYCERIN (NITROSTAT) 0.4 MG SL tablet Place 1 tablet (0.4 mg total) under the tongue every 5 (five) minutes as needed for chest pain. 25 tablet 4  . rosuvastatin (CRESTOR) 40 MG tablet Take 1 tablet (40 mg total) by mouth daily. 90 tablet 3   No current facility-administered medications for this visit.    Allergies:   Citalopram, Ciprofloxacin, Sulfonamide derivatives, and Sulfa antibiotics    Social History:  The patient  reports that she has been smoking cigarettes. She  has been smoking about 0.50 packs per day. She has never used smokeless tobacco. She reports that she does not drink alcohol or use drugs.   Family History:  The patient's family history includes Arrhythmia in her father; CAD in her father; COPD in her father; Cancer in her maternal aunt; Crohn's disease in her sister; Diabetes in her father and mother; Heart disease in her father; Hyperlipidemia in her father and mother; Other in her mother; Sleep apnea in her father; Thyroid disease in her father.    ROS:  Please see the history of present illness.   Otherwise, review of systems are positive for none.   All other systems are reviewed and negative.    PHYSICAL EXAM: VS:  BP 124/78   Pulse (!) 58   Temp (!) 97.4 F (36.3 C)   Ht 5\' 7"  (1.702 m)   Wt 183 lb 12.8 oz (83.4 kg)   SpO2 97%   BMI 28.79 kg/m  , BMI Body mass index is 28.79 kg/m. GEN: Well nourished, well developed, in no acute distress HEENT: sclera anicteric Neck: no JVD, carotid bruits, or masses Cardiac: RRR; no murmurs, rubs, or gallops,no edema  Respiratory:  clear to auscultation bilaterally, normal work of breathing GI: soft, nontender, nondistended, + BS MS: no deformity or atrophy Skin: warm and dry, no rash Neuro:  Strength and sensation are intact Psych: euthymic mood, full affect   EKG:  EKG is not ordered today.   Recent Labs: 12/13/2019: BUN 11; Creatinine, Ser 0.87; Hemoglobin 15.7; Platelets 275; Potassium 4.6; Sodium 141 01/11/2020: TSH 0.266    Lipid Panel    Component Value Date/Time   CHOL 224 (H) 12/13/2019 1023   TRIG 125 12/13/2019 1023   HDL 53 12/13/2019 1023   CHOLHDL 4.2 12/13/2019 1023   CHOLHDL 3.4 Ratio 06/05/2007 2012   VLDL 40 06/05/2007 2012   LDLCALC 149 (H) 12/13/2019 1023   LDLDIRECT 148 (H) 10/30/2007 0039      Wt Readings from Last 3 Encounters:  02/02/20 183 lb 12.8 oz (83.4 kg)  01/11/20 181 lb 3.2 oz (82.2 kg)  12/13/19 180 lb (81.6 kg)      Other studies  Reviewed: Additional studies/ records that were reviewed today include:  Coronary CTA 01/29/20: 1. Coronary calcium score of 156. This was 96th percentile for age and sex matched control.  2. Normal coronary origin with right dominance.  3.  Mild (25-49%) calcified plaque in the LAD.  (CAD-RADS2).  4.  Will send study for FFRct.    ASSESSMENT AND PLAN:  1. Chest pain in patient with non-obstructive CAD: overall improved with addition of amlodipine. Still having occasional chest pains - ?spasm.  - Will increase amlodipine to 5mg  daily - Continue aspirin and statin - Continue metoprolol succinate.   2. Palpitations: benign cardiac monitor in 2018 - Continue metoprolol succinate  3. Hyperlipidemia:  LDL improved to 104 01/23/2020 but still above goal <70 given CAD. Started on crestor 20mg  daily 11/2019.  - Will increase crestor to 40mg  daily - Will repeat FLP/LFTs in 2 months for close monitoring.   4. Hypothyroidism: TSH low, FT4 wnl; synthroid dose recently reduced.  - Continue synthroid per PCP  5. Pre-DM type 2: A1C 4.6 01/23/2020 - improved from 5.7 11/2019 - Encouraged healthy dietary/lifestyle modifications to prevent progression   6. Tobacco abuse: still smoking 1/2 ppd. Has quit cold [place] in the past  - Smoking cessation encouraged.    Current medicines are reviewed at length with the patient today.  The patient does not have concerns regarding medicines.  The following changes have been made:  As above  Labs/ tests ordered today include:   Orders Placed This Encounter  Procedures  . Lipid panel  . Hepatic function panel     Disposition:   FU with Dr. Stanford Breed in 6 months  Signed, Abigail Butts, PA-C  02/02/2020 8:52 AM

## 2020-02-02 ENCOUNTER — Encounter: Payer: Self-pay | Admitting: Medical

## 2020-02-02 ENCOUNTER — Other Ambulatory Visit: Payer: Self-pay

## 2020-02-02 ENCOUNTER — Ambulatory Visit (INDEPENDENT_AMBULATORY_CARE_PROVIDER_SITE_OTHER): Payer: 59 | Admitting: Medical

## 2020-02-02 VITALS — BP 124/78 | HR 58 | Temp 97.4°F | Ht 67.0 in | Wt 183.8 lb

## 2020-02-02 DIAGNOSIS — E039 Hypothyroidism, unspecified: Secondary | ICD-10-CM

## 2020-02-02 DIAGNOSIS — E785 Hyperlipidemia, unspecified: Secondary | ICD-10-CM

## 2020-02-02 DIAGNOSIS — I25119 Atherosclerotic heart disease of native coronary artery with unspecified angina pectoris: Secondary | ICD-10-CM | POA: Diagnosis not present

## 2020-02-02 DIAGNOSIS — Z87898 Personal history of other specified conditions: Secondary | ICD-10-CM | POA: Diagnosis not present

## 2020-02-02 DIAGNOSIS — Z720 Tobacco use: Secondary | ICD-10-CM

## 2020-02-02 DIAGNOSIS — R7303 Prediabetes: Secondary | ICD-10-CM

## 2020-02-02 MED ORDER — AMLODIPINE BESYLATE 5 MG PO TABS: 5.0000 mg | ORAL_TABLET | Freq: Every day | ORAL | 3 refills | Status: DC

## 2020-02-02 MED ORDER — ROSUVASTATIN CALCIUM 40 MG PO TABS
40.0000 mg | ORAL_TABLET | Freq: Every day | ORAL | 3 refills | Status: DC
Start: 2020-02-02 — End: 2020-12-21

## 2020-02-02 NOTE — Patient Instructions (Signed)
Medication Instructions:   INCREASE Amlodipine (Norvasc) to 5 mg daily  INCREASE Rosuvastatin (Crestor) to 40 mg daily *If you need a refill on your cardiac medications before your next appointment, please call your pharmacy*  Lab Work: Your physician recommends that you return for lab work in: 2 months  Fasting Lipid Panel-DO NOT EAT OR DRINK PAST MIDNIGHT. OKAY TO HAVE WATER  Hepatic (Liver) Function test  If you have labs (blood work) drawn today and your tests are completely normal, you will receive your results only by: Marland Kitchen MyChart Message (if you have MyChart) OR . A paper copy in the mail If you have any lab test that is abnormal or we need to change your treatment, we will call you to review the results.  Testing/Procedures: NONE ordered at this time of appointment   Follow-Up: At [facility], you and your health needs are our priority.  As part of our continuing mission to provide you with exceptional heart care, we have created designated Provider Care Teams.  These Care Teams include your primary Cardiologist (physician) and Advanced Practice Providers (APPs -  Physician Assistants and Nurse Practitioners) who all work together to provide you with the care you need, when you need it.  We recommend signing up for the patient portal called "MyChart".  Sign up information is provided on this After Visit Summary.  MyChart is used to connect with patients for Virtual Visits (Telemedicine).  Patients are able to view lab/test results, encounter notes, upcoming appointments, etc.  Non-urgent messages can be sent to your provider as well.   To learn more about what you can do with MyChart, go to [email or web address].    Your next appointment:   6 month(s)  The format for your next appointment:   In Person  Provider:   Kirk Ruths, MD  Other Instructions      Coping with Quitting Smoking  Quitting smoking is a physical and mental challenge. You will face  cravings, withdrawal symptoms, and temptation. Before quitting, work with your health care provider to make a plan that can help you cope. Preparation can help you quit and keep you from giving in. How can I cope with cravings? Cravings usually last for 5-10 minutes. If you get through it, the craving will pass. Consider taking the following actions to help you cope with cravings:  Keep your mouth busy: ? Chew sugar-free gum. ? Suck on hard candies or a straw. ? Brush your teeth.  Keep your hands and body busy: ? Immediately change to a different activity when you feel a craving. ? Squeeze or play with a ball. ? Do an activity or a hobby, like making bead jewelry, practicing needlepoint, or working with wood. ? Mix up your normal routine. ? Take a short exercise break. Go for a quick walk or run up and down stairs. ? Spend time in public places where smoking is not allowed.  Focus on doing something kind or helpful for someone else.  Call a friend or family member to talk during a craving.  Join a support group.  Call a quit line, such as 1-800-QUIT-NOW.  Talk with your health care provider about medicines that might help you cope with cravings and make quitting easier for you. How can I deal with withdrawal symptoms? Your body may experience negative effects as it tries to get used to not having nicotine in the system. These effects are called withdrawal symptoms. They may include:  Feeling hungrier than normal.  Trouble concentrating.  Irritability.  Trouble sleeping.  Feeling depressed.  Restlessness and agitation.  Craving a cigarette. To manage withdrawal symptoms:  Avoid places, people, and activities that trigger your cravings.  Remember why you want to quit.  Get plenty of sleep.  Avoid coffee and other caffeinated drinks. These may worsen some of your symptoms. How can I handle social situations? Social situations can be difficult when you are quitting  smoking, especially in the first few weeks. To manage this, you can:  Avoid parties, bars, and other social situations where people might be smoking.  Avoid alcohol.  Leave right away if you have the urge to smoke.  Explain to your family and friends that you are quitting smoking. Ask for understanding and support.  Plan activities with friends or family where smoking is not an option. What are some ways I can cope with stress? Wanting to smoke may cause stress, and stress can make you want to smoke. Find ways to manage your stress. Relaxation techniques can help. For example:  Breathe slowly and deeply, in through your nose and out through your mouth.  Listen to soothing, relaxing music.  Talk with a family member or friend about your stress.  Light a candle.  Soak in a bath or take a shower.  Think about a peaceful place. What are some ways I can prevent weight gain? Be aware that many people gain weight after they quit smoking. However, not everyone does. To keep from gaining weight, have a plan in place before you quit and stick to the plan after you quit. Your plan should include:  Having healthy snacks. When you have a craving, it may help to: ? Eat plain popcorn, crunchy carrots, celery, or other cut vegetables. ? Chew sugar-free gum.  Changing how you eat: ? Eat small portion sizes at meals. ? Eat 4-6 small meals throughout the day instead of 1-2 large meals a day. ? Be mindful when you eat. Do not watch television or do other things that might distract you as you eat.  Exercising regularly: ? Make time to exercise each day. If you do not have time for a long workout, do short bouts of exercise for 5-10 minutes several times a day. ? Do some form of strengthening exercise, like weight lifting, and some form of aerobic exercise, like running or swimming.  Drinking plenty of water or other low-calorie or no-calorie drinks. Drink 6-8 glasses of water daily, or as much as  instructed by your health care provider. Summary  Quitting smoking is a physical and mental challenge. You will face cravings, withdrawal symptoms, and temptation to smoke again. Preparation can help you as you go through these challenges.  You can cope with cravings by keeping your mouth busy (such as by chewing gum), keeping your body and hands busy, and making calls to family, friends, or a helpline for people who want to quit smoking.  You can cope with withdrawal symptoms by avoiding places where people smoke, avoiding drinks with caffeine, and getting plenty of rest.  Ask your health care provider about the different ways to prevent weight gain, avoid stress, and handle social situations. This information is not intended to replace advice given to you by your health care provider. Make sure you discuss any questions you have with your health care provider. Document Revised: 08/22/2017 Document Reviewed: 09/06/2016 Elsevier Patient Education  2020 [employer].

## 2020-02-11 ENCOUNTER — Other Ambulatory Visit: Payer: Self-pay

## 2020-02-11 ENCOUNTER — Telehealth: Payer: Self-pay | Admitting: Medical

## 2020-02-11 ENCOUNTER — Ambulatory Visit (INDEPENDENT_AMBULATORY_CARE_PROVIDER_SITE_OTHER): Payer: 59 | Admitting: Medical

## 2020-02-11 ENCOUNTER — Encounter: Payer: Self-pay | Admitting: Medical

## 2020-02-11 VITALS — BP 120/80 | HR 57 | Ht 67.0 in | Wt 182.2 lb

## 2020-02-11 DIAGNOSIS — F419 Anxiety disorder, unspecified: Secondary | ICD-10-CM | POA: Diagnosis not present

## 2020-02-11 DIAGNOSIS — F321 Major depressive disorder, single episode, moderate: Secondary | ICD-10-CM

## 2020-02-11 DIAGNOSIS — J439 Emphysema, unspecified: Secondary | ICD-10-CM

## 2020-02-11 DIAGNOSIS — R232 Flushing: Secondary | ICD-10-CM

## 2020-02-11 DIAGNOSIS — N951 Menopausal and female climacteric states: Secondary | ICD-10-CM

## 2020-02-11 DIAGNOSIS — E039 Hypothyroidism, unspecified: Secondary | ICD-10-CM | POA: Diagnosis not present

## 2020-02-11 DIAGNOSIS — E559 Vitamin D deficiency, unspecified: Secondary | ICD-10-CM | POA: Diagnosis not present

## 2020-02-11 HISTORY — DX: Flushing: R23.2

## 2020-02-11 NOTE — Progress Notes (Signed)
Subjective:  Mary Escobar is a 54 y.o. female who presents for Chief Complaint  Patient presents with  . Follow-up    on medications-klonopin might be causing fatigue     Here for f/u from recent physical visit and labs  Labs show vit D low, and she has been using Vit D 2000 increased recently  Compliant with levothyroxine which we recently decreased to 101mcg.   She notes her TSH levels fluctuate over the last few years.   She takes it mornings on empty stomach  Hasn't heard back from GI about appt with Mary Escobar.  She wants to switch to him given mother's carcinoid was diagnosed by Mary Escobar.   Anxiety/depression - doing fine on Wellbutrin.  The clonazepam from last visit seems to sedate her too much.  Still dealing with a lot of anxiety and in evening was going to use this to settle nerves  Last visit we discussed menopause symptoms, hot flashes mainly at night.   She had been on birth control.  She wants to make sure she can't get pregnant.   COPD using Trelegy but seems to be getting mucous in throat, cough, clearing throat.  Mary Escobar was not covered by insurance  No other aggravating or relieving factors.    No other c/o.  Past Medical History:  Diagnosis Date  . Family history of premature CAD   . Fatigue 2020   multifactoral  . Hypothyroid   . IBS (irritable bowel syndrome)   . mild nonobstructive CAD on cath 2009   . Palpitation   . Smoker   . Vitamin D deficiency    Current Outpatient Medications on File Prior to Visit  Medication Sig Dispense Refill  . albuterol (VENTOLIN HFA) 108 (90 Base) MCG/ACT inhaler INHALE 2 PUFFS INTO THE LUNGS EVERY 6 HOURS AS NEEDED FOR WHEEZING OR SHORTNESS OF BREATH 54 g 0  . amLODipine (NORVASC) 5 MG tablet Take 1 tablet (5 mg total) by mouth daily. 90 tablet 3  . ASPIRIN 81 PO Take 81 mg by mouth daily.    Marland Kitchen buPROPion (WELLBUTRIN XL) 300 MG 24 hr tablet Take 1 tablet (300 mg total) by mouth daily. 90 tablet 0  . cholecalciferol  (VITAMIN D3) 25 MCG (1000 UNIT) tablet Take 2 tablets (2,000 Units total) by mouth daily. 180 tablet 3  . ELDERBERRY PO Take by mouth daily.    . Fluticasone-Umeclidin-Vilant (TRELEGY ELLIPTA) 100-62.5-25 MCG/INH AEPB Inhale 1 puff into the lungs daily. 28 each 2  . levothyroxine (SYNTHROID) 75 MCG tablet Take 1 tablet (75 mcg total) by mouth daily. 30 tablet 2  . metoprolol succinate (TOPROL-XL) 25 MG 24 hr tablet TAKE 1 TABLET(25 MG) BY MOUTH DAILY 90 tablet 0  . rosuvastatin (CRESTOR) 40 MG tablet Take 1 tablet (40 mg total) by mouth daily. 90 tablet 3  . nitroGLYCERIN (NITROSTAT) 0.4 MG SL tablet Place 1 tablet (0.4 mg total) under the tongue every 5 (five) minutes as needed for chest pain. (Patient not taking: Reported on 02/11/2020) 25 tablet 4   No current facility-administered medications on file prior to visit.     The following portions of the patient's history were reviewed and updated as appropriate: allergies, current medications, past family history, past medical history, past social history, past surgical history and problem list.  ROS Otherwise as in subjective above  Objective: BP 120/80   Pulse (!) 57   Ht 5\' 7"  (1.702 m)   Wt 182 lb 3.2 oz (82.6 kg)  SpO2 96%   BMI 28.54 kg/m   General appearance: alert, no distress, well developed, well nourished Otherwise not examined    Assessment: Encounter Diagnoses  Name Primary?  . Hypothyroidism, unspecified type Yes  . Pulmonary emphysema, unspecified emphysema type ([place])   . Anxiety   . Vitamin D deficiency   . Depression, major, single episode, moderate ([place])   . Hot flashes   . Menopausal symptom      Plan: hypothyroidism - dose decreased last visit.  Recheck lab 3-4 weeks  Vit D deficiency - we increased to 2000 u daily last visit.  F/u 3-4 weeks for labs  Anxiety, depression - c/t Wellubtrin, stop clonazepam.  Reduce stress where possible  Hot flashes, menopause symptoms - discussed possible therapy  options.   We will call her back after I review chart.   Mary Escobar was seen today for follow-up.  Diagnoses and all orders for this visit:  Hypothyroidism, unspecified type -     TSH; Future  Pulmonary emphysema, unspecified emphysema type ([facility])  Anxiety  Vitamin D deficiency -     VITAMIN D 25 Hydroxy (Vit-D Deficiency, Fractures); Future  Depression, major, single episode, moderate ([facility])  Hot flashes  Menopausal symptom    Follow up: pending call back

## 2020-02-11 NOTE — Telephone Encounter (Signed)
Call Dr. Kathline Magic office [facility]).  She prefers to [facility] with him since mom's cancer was diagnosed by him.  She is asking for [place] to help make the appt.  She has been in touch with them, but please see if they can make her appt with Dr. Michail Sermon.

## 2020-02-14 NOTE — Telephone Encounter (Signed)
Please have her call back and be firm with them.  She has been calling them but is getting the run around.

## 2020-02-14 NOTE — Telephone Encounter (Signed)
Referral was sent for patient and Dr. Michail Sermon office has reached out and stated patient can see Dr. Rodman Key as that's who she saw in the past. They told her she would have to keep calling to see if Dr. Michail Sermon will take her on.

## 2020-02-15 ENCOUNTER — Telehealth: Payer: Self-pay | Admitting: Medical

## 2020-02-15 NOTE — Telephone Encounter (Signed)
Please call her back as a follow up from her recent visit.  Plan: Hypothyroidism - Recheck lab 3-4 weeks  Vit D deficiency -repeat labs in 3-4 weeks  Anxiety, depression - continue Wellubtrin, stop clonazepam.   Hot flashes, menopause symptoms - If agreeable, lets add Clonidine at bedtime.  This often can help with hot flshes and sleep.   Drink plenty of water daily, avoid hot and spicy foods, use a fan at bedtime  Let me know.

## 2020-02-15 NOTE — Telephone Encounter (Signed)
Patient wants to hold off on clonidine.

## 2020-02-17 ENCOUNTER — Ambulatory Visit: Payer: 59 | Admitting: Gastroenterology

## 2020-02-24 ENCOUNTER — Encounter: Payer: Self-pay | Admitting: Gastroenterology

## 2020-02-24 ENCOUNTER — Telehealth: Payer: Self-pay

## 2020-02-24 NOTE — Telephone Encounter (Signed)
Pt. Called stating that she had discussed with you about going back on Klonopin and she is now ready to go back on it and wanted to know if it would be better for her to take it at night and if it is ok to take with her other medications.

## 2020-02-25 ENCOUNTER — Other Ambulatory Visit: Payer: Self-pay | Admitting: Medical

## 2020-02-25 MED ORDER — HYDROXYZINE HCL 10 MG PO TABS
10.0000 mg | ORAL_TABLET | Freq: Two times a day (BID) | ORAL | 0 refills | Status: DC | PRN
Start: 2020-02-25 — End: 2020-04-17

## 2020-02-25 NOTE — Telephone Encounter (Signed)
Sent message via mychart

## 2020-02-25 NOTE — Telephone Encounter (Signed)
I saw the message.  Instead of clonazepam which is habit-forming and very sedating, I recommend we try something else.  I sent to the pharmacy Atarax also called hydroxyzine.  She can take this once or twice daily as needed for feelings of anxiousness.  I recommend counseling if she is not doing this.  Medication should not be the primary means of handling anxiety.  I also recommend using hobbies or other enjoyable things to calm nerves and help deal with anxiety

## 2020-02-26 ENCOUNTER — Other Ambulatory Visit: Payer: Self-pay | Admitting: Medical

## 2020-02-26 DIAGNOSIS — R072 Precordial pain: Secondary | ICD-10-CM

## 2020-03-03 ENCOUNTER — Other Ambulatory Visit: Payer: 59

## 2020-03-03 ENCOUNTER — Other Ambulatory Visit: Payer: Self-pay

## 2020-03-03 DIAGNOSIS — E559 Vitamin D deficiency, unspecified: Secondary | ICD-10-CM

## 2020-03-03 DIAGNOSIS — E785 Hyperlipidemia, unspecified: Secondary | ICD-10-CM

## 2020-03-03 DIAGNOSIS — I25119 Atherosclerotic heart disease of native coronary artery with unspecified angina pectoris: Secondary | ICD-10-CM

## 2020-03-03 DIAGNOSIS — E039 Hypothyroidism, unspecified: Secondary | ICD-10-CM

## 2020-03-04 LAB — TSH: TSH: 1.05 u[IU]/mL (ref 0.450–4.500)

## 2020-03-04 LAB — LIPID PANEL
Chol/HDL Ratio: 2.4 ratio (ref 0.0–4.4)
Cholesterol, Total: 130 mg/dL (ref 100–199)
HDL: 55 mg/dL (ref >39)
LDL Chol Calc (NIH): 57 mg/dL (ref 0–99)
Triglycerides: 99 mg/dL (ref 0–149)
VLDL Cholesterol Cal: 18 mg/dL (ref 5–40)

## 2020-03-04 LAB — HEPATIC FUNCTION PANEL
ALT: 26 IU/L (ref 0–32)
AST: 25 IU/L (ref 0–40)
Albumin: 4.6 g/dL (ref 3.8–4.9)
Alkaline Phosphatase: 84 IU/L (ref 48–121)
Bilirubin Total: 0.3 mg/dL (ref 0.0–1.2)
Bilirubin, Direct: 0.12 mg/dL (ref 0.00–0.40)
Total Protein: 6.9 g/dL (ref 6.0–8.5)

## 2020-03-04 LAB — VITAMIN D 25 HYDROXY (VIT D DEFICIENCY, FRACTURES): Vit D, 25-Hydroxy: 24.9 ng/mL — ABNORMAL LOW (ref 30.0–100.0)

## 2020-03-06 ENCOUNTER — Other Ambulatory Visit: Payer: Self-pay | Admitting: Medical

## 2020-03-06 MED ORDER — VITAMIN D (ERGOCALCIFEROL) 1.25 MG (50000 UNIT) PO CAPS
50000.0000 [IU] | ORAL_CAPSULE | ORAL | 3 refills | Status: DC
Start: 2020-03-06 — End: 2020-06-09

## 2020-03-06 MED ORDER — LEVOTHYROXINE SODIUM 75 MCG PO TABS: 75.0000 ug | ORAL_TABLET | Freq: Every day | ORAL | 1 refills | Status: DC

## 2020-03-09 ENCOUNTER — Other Ambulatory Visit: Payer: 59

## 2020-03-23 ENCOUNTER — Encounter (HOSPITAL_BASED_OUTPATIENT_CLINIC_OR_DEPARTMENT_OTHER): Payer: Self-pay

## 2020-03-23 ENCOUNTER — Other Ambulatory Visit: Payer: Self-pay

## 2020-03-23 ENCOUNTER — Emergency Department (HOSPITAL_BASED_OUTPATIENT_CLINIC_OR_DEPARTMENT_OTHER): Payer: 59

## 2020-03-23 ENCOUNTER — Emergency Department (HOSPITAL_BASED_OUTPATIENT_CLINIC_OR_DEPARTMENT_OTHER)
Admission: EM | Admit: 2020-03-23 | Discharge: 2020-03-23 | Disposition: A | Discharge status: Home / Self Care | Payer: 59 | Attending: Emergency Medicine | Admitting: Emergency Medicine

## 2020-03-23 DIAGNOSIS — E039 Hypothyroidism, unspecified: Secondary | ICD-10-CM | POA: Diagnosis not present

## 2020-03-23 DIAGNOSIS — F17210 Nicotine dependence, cigarettes, uncomplicated: Secondary | ICD-10-CM | POA: Diagnosis not present

## 2020-03-23 DIAGNOSIS — I2510 Atherosclerotic heart disease of native coronary artery without angina pectoris: Secondary | ICD-10-CM | POA: Diagnosis not present

## 2020-03-23 DIAGNOSIS — R109 Unspecified abdominal pain: Secondary | ICD-10-CM | POA: Diagnosis present

## 2020-03-23 DIAGNOSIS — N200 Calculus of kidney: Secondary | ICD-10-CM | POA: Diagnosis not present

## 2020-03-23 DIAGNOSIS — J449 Chronic obstructive pulmonary disease, unspecified: Secondary | ICD-10-CM | POA: Insufficient documentation

## 2020-03-23 DIAGNOSIS — Z7982 Long term (current) use of aspirin: Secondary | ICD-10-CM | POA: Diagnosis not present

## 2020-03-23 LAB — URINALYSIS, ROUTINE W REFLEX MICROSCOPIC
Bilirubin Urine: NEGATIVE
Glucose, UA: NEGATIVE mg/dL
Ketones, ur: NEGATIVE mg/dL
Leukocytes,Ua: NEGATIVE
Nitrite: NEGATIVE
Protein, ur: 100 mg/dL — AB
Specific Gravity, Urine: >1.03 — ABNORMAL HIGH (ref 1.005–1.030)
pH: 6 (ref 5.0–8.0)

## 2020-03-23 LAB — COMPREHENSIVE METABOLIC PANEL
ALT: 23 U/L (ref 0–44)
AST: 20 U/L (ref 15–41)
Albumin: 4.6 g/dL (ref 3.5–5.0)
Alkaline Phosphatase: 70 U/L (ref 38–126)
Anion gap: 9 (ref 5–15)
BUN: 17 mg/dL (ref 6–20)
CO2: 24 mmol/L (ref 22–32)
Calcium: 9.2 mg/dL (ref 8.9–10.3)
Chloride: 105 mmol/L (ref 98–111)
Creatinine, Ser: 1.06 mg/dL — ABNORMAL HIGH (ref 0.44–1.00)
GFR calc Af Amer: >60 mL/min (ref >60)
GFR calc non Af Amer: 59 mL/min — ABNORMAL LOW (ref >60)
Glucose, Bld: 120 mg/dL — ABNORMAL HIGH (ref 70–99)
Potassium: 3.8 mmol/L (ref 3.5–5.1)
Sodium: 138 mmol/L (ref 135–145)
Total Bilirubin: 0.5 mg/dL (ref 0.3–1.2)
Total Protein: 7.7 g/dL (ref 6.5–8.1)

## 2020-03-23 LAB — CBC WITH DIFFERENTIAL/PLATELET
Abs Immature Granulocytes: 0.03 10*3/uL (ref 0.00–0.07)
Basophils Absolute: 0 10*3/uL (ref 0.0–0.1)
Basophils Relative: 0 %
Eosinophils Absolute: 0 10*3/uL (ref 0.0–0.5)
Eosinophils Relative: 0 %
HCT: 44.1 % (ref 36.0–46.0)
Hemoglobin: 14.7 g/dL (ref 12.0–15.0)
Immature Granulocytes: 0 %
Lymphocytes Relative: 10 %
Lymphs Abs: 1.1 10*3/uL (ref 0.7–4.0)
MCH: 32.5 pg (ref 26.0–34.0)
MCHC: 33.3 g/dL (ref 30.0–36.0)
MCV: 97.4 fL (ref 80.0–100.0)
Monocytes Absolute: 0.6 10*3/uL (ref 0.1–1.0)
Monocytes Relative: 6 %
Neutro Abs: 9 10*3/uL — ABNORMAL HIGH (ref 1.7–7.7)
Neutrophils Relative %: 84 %
Platelets: 224 10*3/uL (ref 150–400)
RBC: 4.53 MIL/uL (ref 3.87–5.11)
RDW: 13 % (ref 11.5–15.5)
WBC: 10.7 10*3/uL — ABNORMAL HIGH (ref 4.0–10.5)
nRBC: 0 % (ref 0.0–0.2)

## 2020-03-23 LAB — PREGNANCY, URINE: Preg Test, Ur: NEGATIVE

## 2020-03-23 LAB — URINALYSIS, MICROSCOPIC (REFLEX): RBC / HPF: >50 RBC/hpf (ref 0–5)

## 2020-03-23 IMAGING — CT CT RENAL STONE PROTOCOL
2 of 4 series · 16 of 46 positions shown, 18 images · non-contrast
Comparison: [DATE]

CLINICAL DATA: Right-sided flank pain for several hours

EXAM:
CT ABDOMEN AND PELVIS WITHOUT CONTRAST
TECHNIQUE: Multidetector CT imaging of the abdomen and pelvis was performed
following the standard protocol without IV contrast.

[Series 2: axial st · axial · 0.72mm/px · z∈[-548,-118]mm · 13 of 94 slices shown, 15 images]
[im 4/94  soft-tissue]
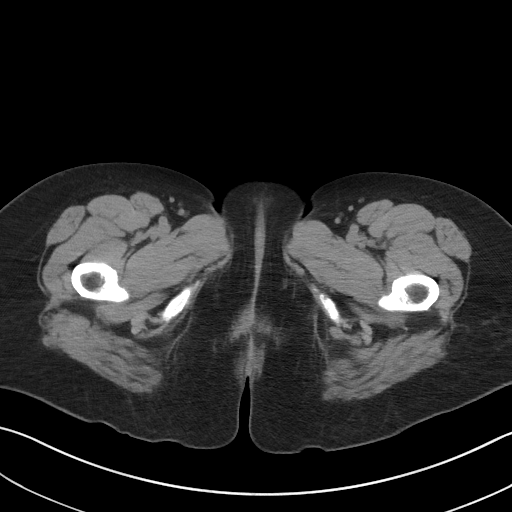
[im 4/94  bone]
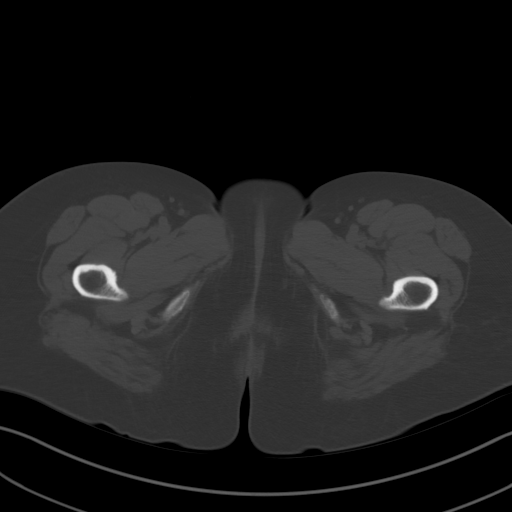
[im 11/94  soft-tissue]
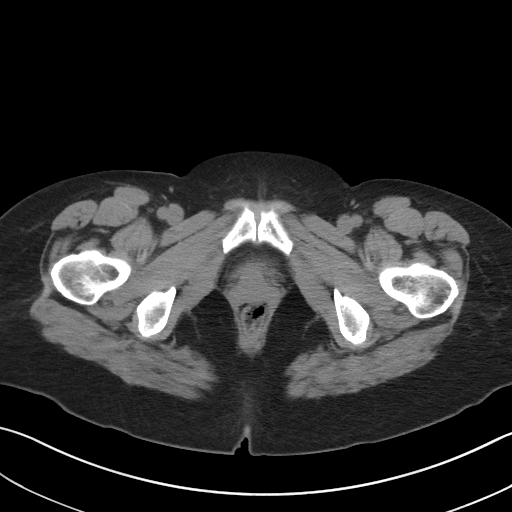
[im 18/94  soft-tissue]
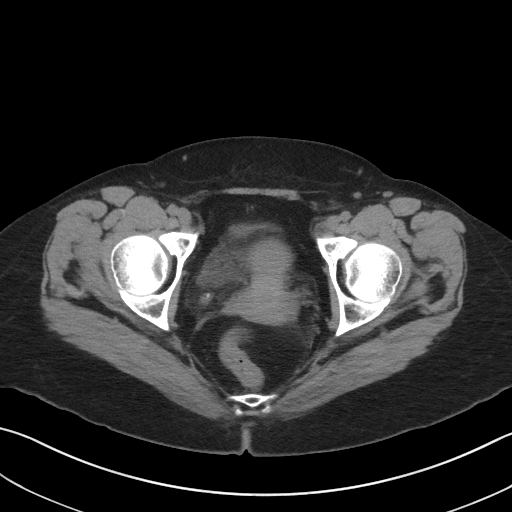
[im 26/94  soft-tissue]
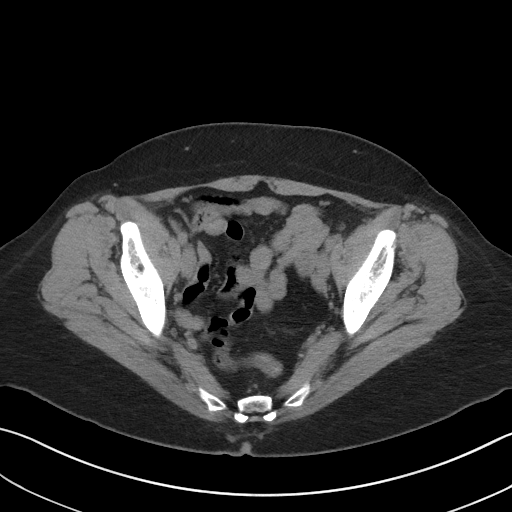
[im 33/94  soft-tissue]
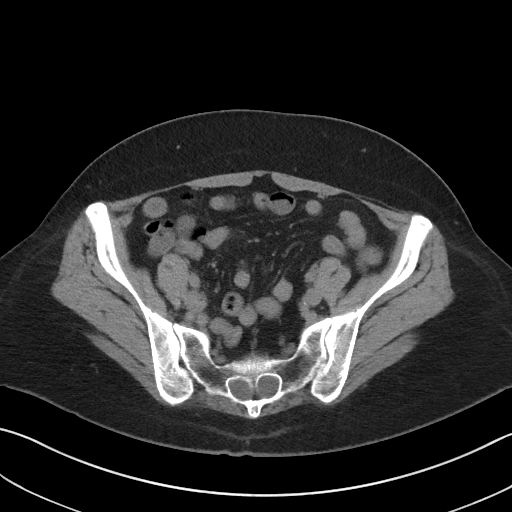
[im 40/94  soft-tissue]
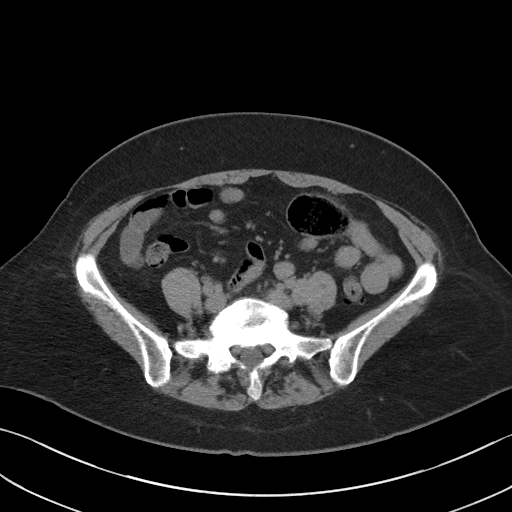
[im 47/94  soft-tissue]
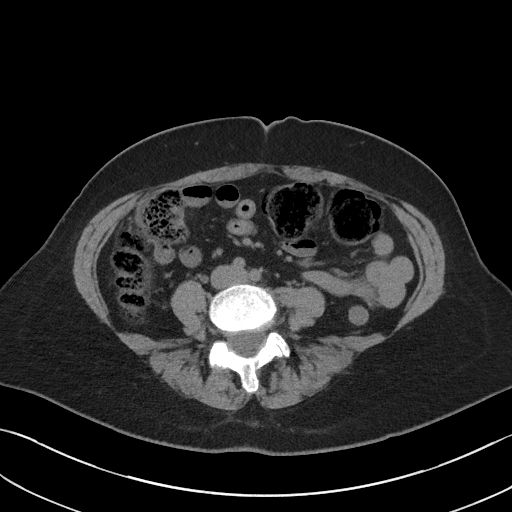
[im 54/94  soft-tissue]
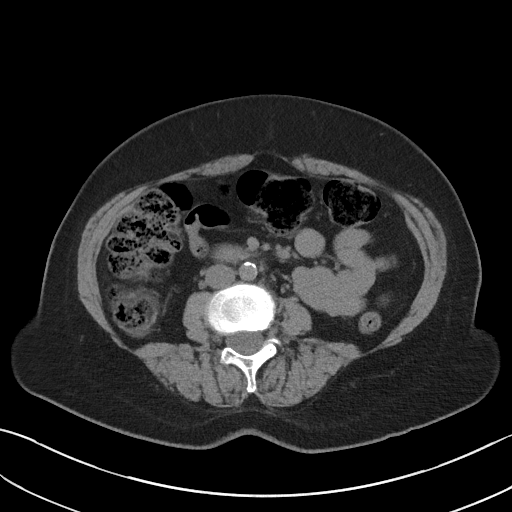
[im 61/94  soft-tissue]
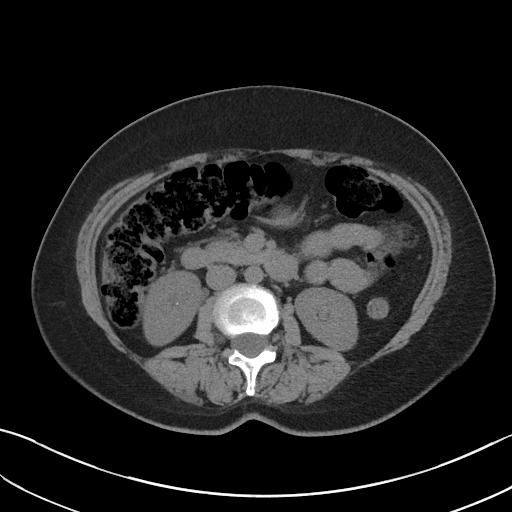
[im 61/94  bone]
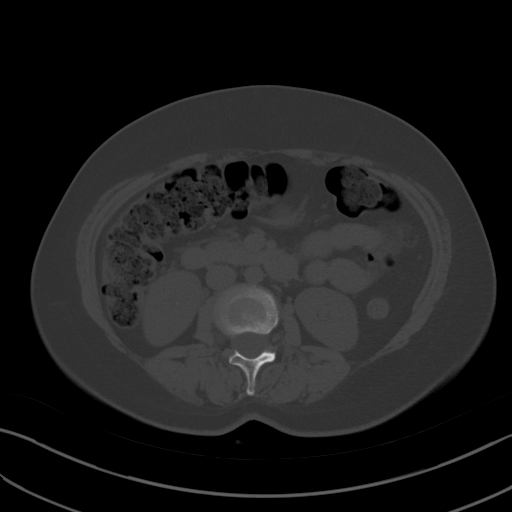
[im 68/94  soft-tissue]
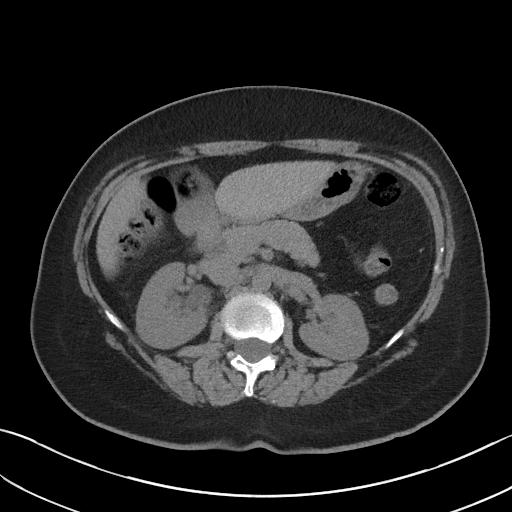
[im 76/94  soft-tissue]
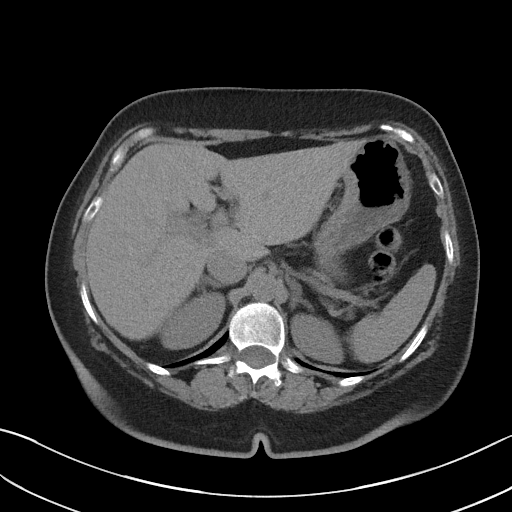
[im 83/94  soft-tissue]
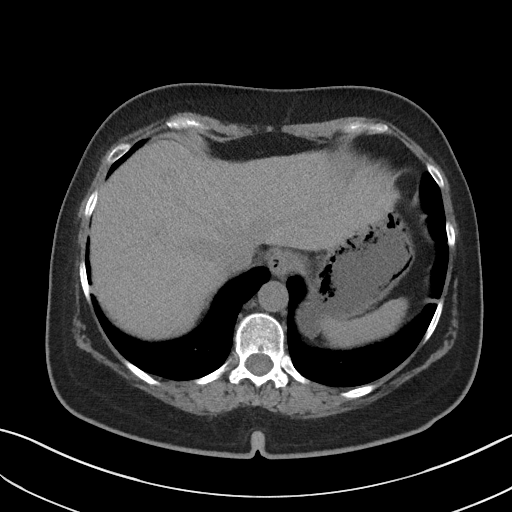
[im 90/94  soft-tissue]
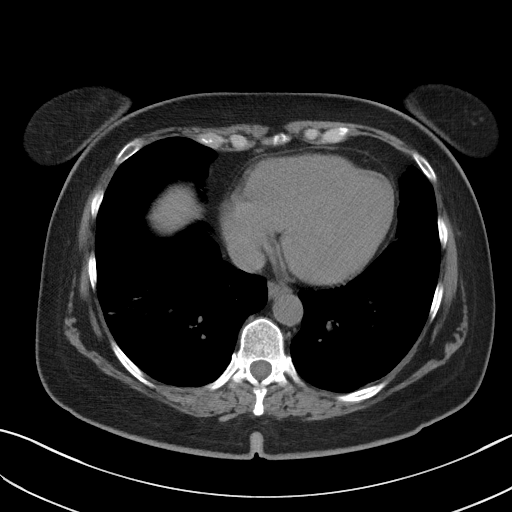

[Series 5: coronal st · coronal · 0.65mm/px · 3 of 91 slices shown]
[im 31/91  soft-tissue]
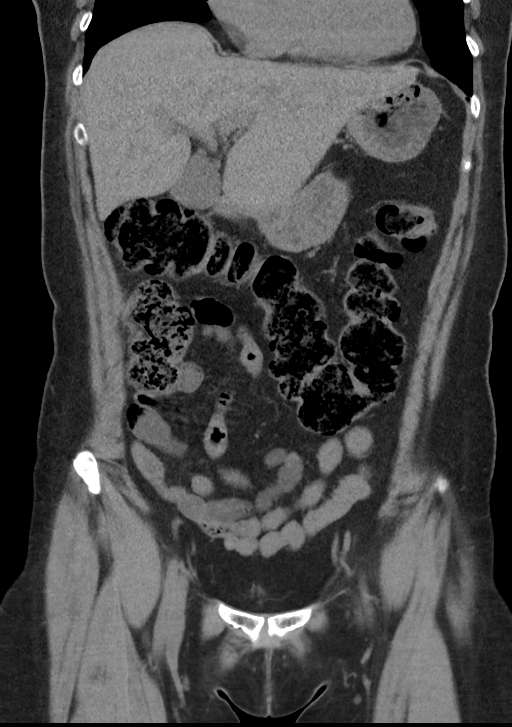
[im 41/91  soft-tissue]
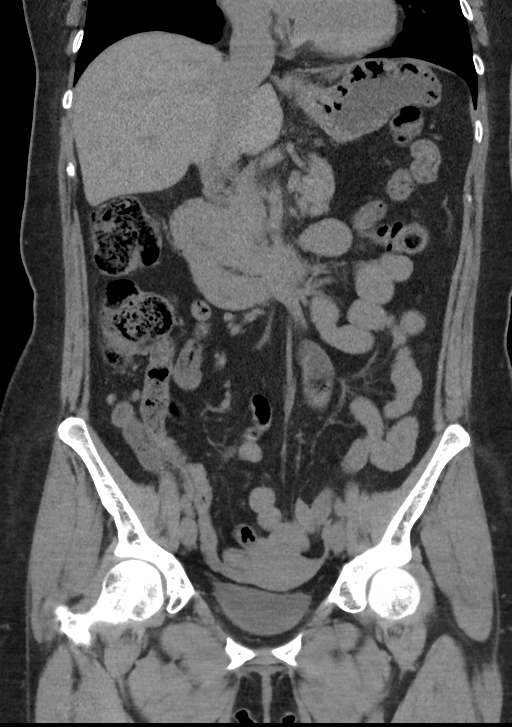
[im 51/91  soft-tissue]
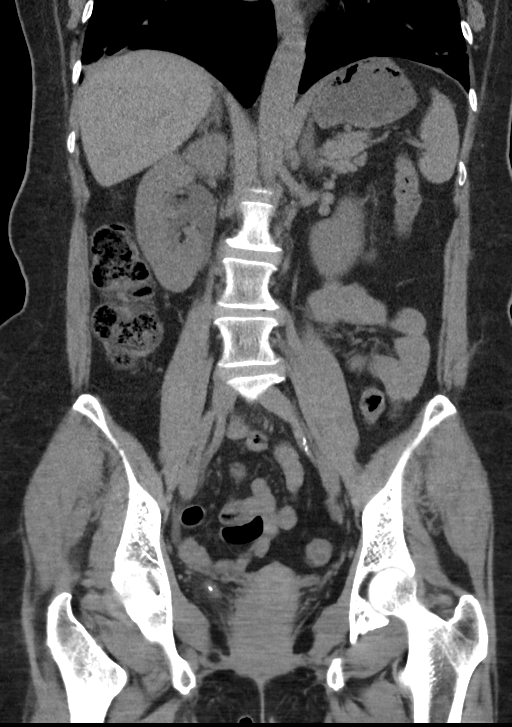

[16 of 46 positions shown; findings below may reference images not displayed]

FINDINGS: Lower chest: Lung bases demonstrate mild atelectatic changes. No
focal infiltrate is seen.

Hepatobiliary: No focal liver abnormality is seen. No gallstones,
gallbladder wall thickening, or biliary dilatation.

Pancreas: Unremarkable. No pancreatic ductal dilatation or
surrounding inflammatory changes.

Spleen: Normal in size without focal abnormality.

Adrenals/Urinary Tract: Adrenal glands are within normal limits.
Left kidney demonstrates no renal calculi or obstructive changes.
Tiny punctate right lower pole renal calculi are noted. Fullness of
the right collecting system and ureter is seen. This extends
inferiorly to the distal ureter. A 2-3 mm stone is noted in the
distal right ureter best seen on image number 28 of series 2 and 52
of series 5 representing a migrated stone from the previous exam.
Bladder is predominately decompressed.

Stomach/Bowel: Colon is predominately decompressed. The appendix is
well visualized and within normal limits. No small bowel or gastric
abnormality is noted.

Vascular/Lymphatic: Aortic atherosclerosis. No enlarged abdominal or
pelvic lymph nodes.

Reproductive: Uterus and bilateral adnexa are unremarkable.

Other: No abdominal wall hernia or abnormality. No abdominopelvic
ascites.

Musculoskeletal: No acute or significant osseous findings.
IMPRESSION: 2 mm distal right ureteral stone with hydronephrosis and hydroureter
.

Tiny nonobstructing right renal calculi.

No other focal abnormality is noted.

## 2020-03-23 MED ORDER — OXYCODONE-ACETAMINOPHEN 5-325 MG PO TABS: 1.0000 | ORAL_TABLET | Freq: Four times a day (QID) | ORAL | 0 refills | Status: DC | PRN

## 2020-03-23 MED ORDER — PROMETHAZINE HCL 12.5 MG PO TABS: 12.5000 mg | ORAL_TABLET | Freq: Four times a day (QID) | ORAL | 0 refills | Status: DC | PRN

## 2020-03-23 MED ORDER — PROMETHAZINE HCL 25 MG/ML IJ SOLN
INTRAMUSCULAR | Status: AC
  Administered 2020-03-23: 12.5 mg via INTRAVENOUS
  Filled 2020-03-23: qty 1

## 2020-03-23 MED ORDER — MORPHINE SULFATE (PF) 4 MG/ML IV SOLN
4.0000 mg | Freq: Once | INTRAVENOUS | Status: AC
  Administered 2020-03-23: 4 mg via INTRAVENOUS
  Filled 2020-03-23: qty 1

## 2020-03-23 MED ORDER — PROMETHAZINE HCL 25 MG/ML IJ SOLN: 12.5000 mg | Freq: Once | INTRAMUSCULAR | Status: AC

## 2020-03-23 MED ORDER — SODIUM CHLORIDE 0.9 % IV BOLUS
1000.0000 mL | Freq: Once | INTRAVENOUS | Status: AC
  Administered 2020-03-23: 1000 mL via INTRAVENOUS

## 2020-03-23 MED ORDER — ONDANSETRON HCL 4 MG/2ML IJ SOLN
4.0000 mg | Freq: Once | INTRAMUSCULAR | Status: AC
  Administered 2020-03-23: 4 mg via INTRAVENOUS
  Filled 2020-03-23: qty 2

## 2020-03-23 NOTE — Discharge Instructions (Addendum)
You were seen in the emergency department and found to have a kidney stone.  Your kidney function was mildly elevated, please have this rechecked by your primary care provider or with urology.  We are sending you home with multiple medications to assist with passing the stone:   -Percocet-this is a narcotic/controlled substance medication that has potential addicting qualities.  We recommend that you take 1-2 tablets every 6 hours as needed for severe pain.  Do not drive or operate heavy machinery when taking this medicine as it can be sedating. Do not drink alcohol or take other sedating medications when taking this medicine for safety reasons.  Keep this out of reach of small children.  Please be aware this medicine has Tylenol in it (325 mg/tab) do not exceed the maximum dose of Tylenol in a day per over the counter recommendations should you decide to supplement with Tylenol over the counter.   -Phenergan-this is an antinausea medication, you may take this every 6 hours as needed for nausea and vomiting, please allow the tablet to dissolve underneath of your tongue.   Take your previously prescribed Flomax.   We have prescribed you new medication(s) today. Discuss the medications prescribed today with your pharmacist as they can have adverse effects and interactions with your other medicines including over the counter and prescribed medications. Seek medical evaluation if you start to experience new or abnormal symptoms after taking one of these medicines, seek care immediately if you start to experience difficulty breathing, feeling of your throat closing, facial swelling, or rash as these could be indications of a more serious allergic reaction  Please follow-up with the urology group provided in your discharge instructions within 3 to 5 days.  Return to the ER for new or worsening symptoms including but not limited to worsening pain not controlled by these medicines, inability to keep fluids down,  fever, or any other concerns that you may have.

## 2020-03-23 NOTE — ED Notes (Signed)
Pt to [facility] for u/a

## 2020-03-23 NOTE — ED Notes (Signed)
Patient transported to CT 

## 2020-03-23 NOTE — ED Provider Notes (Signed)
[facility] EMERGENCY DEPARTMENT Provider Note   CSN: [id number] Arrival date & time: 03/23/20  1317     History Chief Complaint  Patient presents with  . Flank Pain    Mary Escobar is a 54 y.o. female with a history of tobacco abuse, IBS, hypothyroidism, CAD, and prior nephrolithiasis who presents to the ED with complaints of R flank pain for the past 5-6 days intermittently that acutely worsened today. Patient states pain is located in the R flank & radiates into the R abdomen some. Pain is without alleviating/aggravating factors. Reports associated nausea with a few episodes of vomiting. Feels like prior kidney stone. Denies fever, chills, hematemesis, dysuria, frequency, hematuria, chest pain, or dyspnea.   HPI     Past Medical History:  Diagnosis Date  . Family history of premature CAD   . Fatigue 2020   multifactoral  . Hypothyroid   . IBS (irritable bowel syndrome)   . mild nonobstructive CAD on cath 2009   . Palpitation   . Smoker   . Vitamin D deficiency     Patient Active Problem List   Diagnosis Date Noted  . Hot flashes 02/11/2020  . Encounter for hepatitis C screening test for low risk patient 01/11/2020  . DOE (dyspnea on exertion) 01/11/2020  . Vaccine counseling 01/11/2020  . Depression, major, single episode, moderate ([place]) 01/11/2020  . Chronic constipation 01/11/2020  . Menopausal symptom 01/11/2020  . COPD (chronic obstructive pulmonary disease) ([place]) 12/08/2019  . CAD (coronary artery disease), native coronary artery 12/08/2019  . Irritable bowel syndrome 07/16/2019  . Chronic bilateral back pain 12/08/2018  . Polyarthralgia 12/08/2018  . Vitamin D deficiency 12/08/2018  . Fatigue 09/08/2018  . Snoring 09/08/2018  . Family history of premature CAD 09/08/2018  . Chest tightness 11/21/2015  . Nicotine dependence 10/24/2011  . Allergic rhinitis 10/29/2007  . History of palpitations 04/08/2007  . Hypothyroidism 07/01/2006  . Anxiety  07/01/2006    Past Surgical History:  Procedure Laterality Date  . CORONARY ANGIOGRAM  2009   mild CAD noted after abn GXT  . WRIST SURGERY       OB History   No obstetric history on file.     Family History  Problem Relation Age of Onset  . CAD Father        MI at age 84  . Heart disease Father        CABG x 3  . Sleep apnea Father   . Arrhythmia Father        pacemaker  . COPD Father   . Diabetes Father   . Thyroid disease Father   . Hyperlipidemia Father   . Diabetes Mother   . Other Mother        carcinoid tumor  . Hyperlipidemia Mother   . Crohn's disease Sister   . Cancer Maternal Aunt        colon    Social History   Tobacco Use  . Smoking status: Current Every Day Smoker    Packs/day: 0.50    Types: Cigarettes  . Smokeless tobacco: Never Used  Vaping Use  . Vaping Use: Never used  Substance Use Topics  . Alcohol use: No    Alcohol/week: 0.0 standard drinks  . Drug use: No    Home Medications Prior to Admission medications   Medication Sig Start Date End Date Taking? Authorizing Provider  albuterol (VENTOLIN HFA) 108 (90 Base) MCG/ACT inhaler INHALE 2 PUFFS INTO THE LUNGS EVERY 6  HOURS AS NEEDED FOR WHEEZING OR SHORTNESS OF BREATH 01/12/20   Tysinger, Camelia Eng, PA-C  amLODipine (NORVASC) 5 MG tablet Take 1 tablet (5 mg total) by mouth daily. 02/02/20   Kroeger, Lorelee Cover., PA-C  ASPIRIN 81 PO Take 81 mg by mouth daily.    [provider]  buPROPion (WELLBUTRIN XL) 300 MG 24 hr tablet Take 1 tablet (300 mg total) by mouth daily. 01/12/20   Tysinger, Camelia Eng, PA-C  ELDERBERRY PO Take by mouth daily.    [provider]  Fluticasone-Umeclidin-Vilant (TRELEGY ELLIPTA) 100-62.5-25 MCG/INH AEPB Inhale 1 puff into the lungs daily. 01/17/20   Tysinger, Camelia Eng, PA-C  hydrOXYzine (ATARAX/VISTARIL) 10 MG tablet Take 1 tablet (10 mg total) by mouth 2 (two) times daily as needed. 02/25/20   Tysinger, Camelia Eng, PA-C  levothyroxine (SYNTHROID) 75 MCG  tablet Take 1 tablet (75 mcg total) by mouth daily. 03/06/20 03/06/21  Tysinger, Camelia Eng, PA-C  metoprolol succinate (TOPROL-XL) 25 MG 24 hr tablet TAKE 1 TABLET(25 MG) BY MOUTH DAILY 02/28/20   Tysinger, Camelia Eng, PA-C  nitroGLYCERIN (NITROSTAT) 0.4 MG SL tablet Place 1 tablet (0.4 mg total) under the tongue every 5 (five) minutes as needed for chest pain. Patient not taking: Reported on 02/11/2020 12/08/19 03/07/20  Erlene Quan, PA-C  rosuvastatin (CRESTOR) 40 MG tablet Take 1 tablet (40 mg total) by mouth daily. 02/02/20   Kroeger, Lorelee Cover., PA-C  Vitamin D, Ergocalciferol, (DRISDOL) 1.25 MG (50000 UNIT) CAPS capsule Take 1 capsule (50,000 Units total) by mouth every 7 (seven) days. 03/06/20   Tysinger, Camelia Eng, PA-C    Allergies    Citalopram, Ciprofloxacin, Sulfonamide derivatives, and Sulfa antibiotics  Review of Systems   Review of Systems  Constitutional: Negative for chills and fever.  Respiratory: Negative for shortness of breath.   Cardiovascular: Negative for chest pain.  Gastrointestinal: Positive for abdominal pain, nausea and vomiting. Negative for blood in stool, constipation and diarrhea.  Genitourinary: Positive for flank pain. Negative for dysuria, hematuria, urgency, vaginal bleeding and vaginal discharge.  Neurological: Negative for syncope, weakness and numbness.  All other systems reviewed and are negative.   Physical Exam Updated Vital Signs BP 101/67 (BP Location: Right Arm)   Pulse (!) 56   Temp 97.6 F (36.4 C) (Oral)   Resp 18   Ht 5\' 7"  (1.702 m)   Wt 80.7 kg   SpO2 99%   BMI 27.88 kg/m   Physical Exam Vitals and nursing note reviewed.  Constitutional:      General: She is not in acute distress.    Appearance: She is well-developed. She is not toxic-appearing.  HENT:     Head: Normocephalic and atraumatic.  Eyes:     General:        Right eye: No discharge.        Left eye: No discharge.     Conjunctiva/sclera: Conjunctivae normal.    Cardiovascular:     Rate and Rhythm: Normal rate and regular rhythm.     Comments: 2+ symmetric radial and DP pulses bilaterally. Pulmonary:     Effort: Pulmonary effort is normal. No respiratory distress.     Breath sounds: Normal breath sounds. No wheezing, rhonchi or rales.  Abdominal:     General: There is no distension.     Palpations: Abdomen is soft.     Tenderness: There is no abdominal tenderness. There is no right CVA tenderness, left CVA tenderness, guarding or rebound.  Musculoskeletal:  Cervical back: Neck supple.  Skin:    General: Skin is warm and dry.     Findings: No rash.  Neurological:     Mental Status: She is alert.     Comments: Clear speech.   Psychiatric:        Behavior: Behavior normal.     ED Results / Procedures / Treatments   Labs (all labs ordered are listed, but only abnormal results are displayed) Labs Reviewed  URINALYSIS, ROUTINE W REFLEX MICROSCOPIC - Abnormal; Notable for the following components:      Result Value   APPearance CLOUDY (*)    Specific Gravity, Urine >1.030 (*)    Hgb urine dipstick LARGE (*)    Protein, ur 100 (*)    All other components within normal limits  URINALYSIS, MICROSCOPIC (REFLEX) - Abnormal; Notable for the following components:   Bacteria, UA MANY (*)    All other components within normal limits  CBC WITH DIFFERENTIAL/PLATELET - Abnormal; Notable for the following components:   WBC 10.7 (*)    Neutro Abs 9.0 (*)    All other components within normal limits  COMPREHENSIVE METABOLIC PANEL - Abnormal; Notable for the following components:   Glucose, Bld 120 (*)    Creatinine, Ser 1.06 (*)    GFR calc non Af Amer 59 (*)    All other components within normal limits  URINE CULTURE  PREGNANCY, URINE    EKG None  Radiology CT Renal Stone Study  Result Date: 03/23/2020 CLINICAL DATA:  Right-sided flank pain for several hours EXAM: CT ABDOMEN AND PELVIS WITHOUT CONTRAST TECHNIQUE: Multidetector CT  imaging of the abdomen and pelvis was performed following the standard protocol without IV contrast. COMPARISON:  08/09/2019 FINDINGS: Lower chest: Lung bases demonstrate mild atelectatic changes. No focal infiltrate is seen. Hepatobiliary: No focal liver abnormality is seen. No gallstones, gallbladder wall thickening, or biliary dilatation. Pancreas: Unremarkable. No pancreatic ductal dilatation or surrounding inflammatory changes. Spleen: Normal in size without focal abnormality. Adrenals/Urinary Tract: Adrenal glands are within normal limits. Left kidney demonstrates no renal calculi or obstructive changes. Tiny punctate right lower pole renal calculi are noted. Fullness of the right collecting system and ureter is seen. This extends inferiorly to the distal ureter. A 2-3 mm stone is noted in the distal right ureter best seen on image number 28 of series 2 and 52 of series 5 representing a migrated stone from the previous exam. Bladder is predominately decompressed. Stomach/Bowel: Colon is predominately decompressed. The appendix is well visualized and within normal limits. No small bowel or gastric abnormality is noted. Vascular/Lymphatic: Aortic atherosclerosis. No enlarged abdominal or pelvic lymph nodes. Reproductive: Uterus and bilateral adnexa are unremarkable. Other: No abdominal wall hernia or abnormality. No abdominopelvic ascites. Musculoskeletal: No acute or significant osseous findings. IMPRESSION: 2 mm distal right ureteral stone with hydronephrosis and hydroureter . Tiny nonobstructing right renal calculi. No other focal abnormality is noted. Electronically Signed   By: Inez Catalina M.D.   On: 03/23/2020 15:56    Procedures Procedures (including critical care time)  Medications Ordered in ED Medications  morphine 4 MG/ML injection 4 mg (4 mg Intravenous Given 03/23/20 1506)  ondansetron (ZOFRAN) injection 4 mg (4 mg Intravenous Given 03/23/20 1506)  sodium chloride 0.9 % bolus 1,000 mL (1,000  mLs Intravenous New Bag/Given 03/23/20 1507)  promethazine (PHENERGAN) injection 12.5 mg (12.5 mg Intravenous Given 03/23/20 1548)    ED Course  I have reviewed the triage vital signs and the nursing notes.  Pertinent labs & imaging results that were available during my care of the patient were reviewed by me and considered in my medical decision making (see chart for details).    MDM Rules/Calculators/A&P                         Patient presents to the ED with complaints of R flank pain.  Patient is nontoxic, vitals without significant abnormality.  Benign exam.  DDx: Kidney stone, pyelonephritis, appendicitis, musculoskeletal pain, cholecystitis, dissection. Additional history obtained:  Additional history obtained from chart review & patient's sister at bedside. Previous records obtained and reviewed.   Lab Tests:  I Ordered, reviewed, and interpreted labs, which included:  CBC: Mild leukocytosis. No anemia. CMP: Mildly elevated creatinine fairly similar to last time she had a kidney stone. No significant electrolyte derangement. LFTs within normal limits.  Urinalysis: Hematuria, many bacteria present in the urine, however negative leukocytes and nitrites, will send for culture.  Imaging Studies ordered:  I discussed imaging options with the patient given she has a history of prior kidney stones and this feels similar, we discussed no imaging, ultrasound, and CT renal stone study as options, her/benefits discussed, ultimately she would like to have a CT scan done therefore I ordered CT renal stone study, I independently visualized and interpreted imaging which showed 2 mm distal right ureteral stone with hydronephrosis and hydroureter . Tiny nonobstructing right renal calculi. No other focal abnormality is noted  ED Course:  2 mm distal right ureteral stone present, likely cause of patient's symptoms. Mildly elevated creatinine, likely secondary to her kidney stone, no significant  AKI. She has many bacteria in her urine, however this is negative for leukocytes and nitrites, she is afebrile, nontoxic-appearing, small distal stone, do not suspect significant acute infection, will send for culture with very strict return precautions in this regard. She is feeling much better following analgesics and antiemetics in the emergency department and would like to go home. She still has Flomax at home from her last kidney stone as well as Zofran. Will discharge with Percocet and Phenergan with urology follow-up. I discussed results, treatment plan, need for follow-up, and return precautions with the patient. Provided opportunity for questions, patient confirmed understanding and is in agreement with plan.   Findings and plan of care discussed with supervising physician Dr. Rex Kras who is in agreement.   Portions of this note were generated with [employer]. Dictation errors may occur despite best attempts at proofreading.  Final Clinical Impression(s) / ED Diagnoses Final diagnoses:  Kidney stone    Rx / DC Orders ED Discharge Orders         Ordered    oxyCODONE-acetaminophen (PERCOCET/ROXICET) 5-325 MG tablet  Every 6 hours PRN     Discontinue  Reprint     03/23/20 1729    promethazine (PHENERGAN) 12.5 MG tablet  Every 6 hours PRN     Discontinue  Reprint     03/23/20 1729           Hang Ammon, Glynda Jaeger, PA-C 03/23/20 1755    Little, Wenda Overland, MD 03/27/20 [phone]

## 2020-03-23 NOTE — ED Triage Notes (Signed)
Pt c/o right flank pain x 3 hours-states feels like kidney stone-NAD-slow gait

## 2020-03-25 ENCOUNTER — Other Ambulatory Visit: Payer: Self-pay

## 2020-03-25 ENCOUNTER — Encounter (HOSPITAL_BASED_OUTPATIENT_CLINIC_OR_DEPARTMENT_OTHER): Payer: Self-pay | Admitting: Emergency Medicine

## 2020-03-25 ENCOUNTER — Emergency Department (HOSPITAL_BASED_OUTPATIENT_CLINIC_OR_DEPARTMENT_OTHER)
Admission: EM | Admit: 2020-03-25 | Discharge: 2020-03-25 | Disposition: A | Discharge status: Home / Self Care | Payer: 59 | Attending: Emergency Medicine | Admitting: Emergency Medicine

## 2020-03-25 DIAGNOSIS — E039 Hypothyroidism, unspecified: Secondary | ICD-10-CM | POA: Diagnosis not present

## 2020-03-25 DIAGNOSIS — N200 Calculus of kidney: Secondary | ICD-10-CM | POA: Diagnosis not present

## 2020-03-25 DIAGNOSIS — J449 Chronic obstructive pulmonary disease, unspecified: Secondary | ICD-10-CM | POA: Diagnosis not present

## 2020-03-25 DIAGNOSIS — R1031 Right lower quadrant pain: Secondary | ICD-10-CM | POA: Diagnosis present

## 2020-03-25 DIAGNOSIS — F17210 Nicotine dependence, cigarettes, uncomplicated: Secondary | ICD-10-CM | POA: Insufficient documentation

## 2020-03-25 DIAGNOSIS — I2510 Atherosclerotic heart disease of native coronary artery without angina pectoris: Secondary | ICD-10-CM | POA: Insufficient documentation

## 2020-03-25 DIAGNOSIS — Z7982 Long term (current) use of aspirin: Secondary | ICD-10-CM | POA: Insufficient documentation

## 2020-03-25 LAB — URINALYSIS, MICROSCOPIC (REFLEX)

## 2020-03-25 LAB — CBC WITH DIFFERENTIAL/PLATELET
Abs Immature Granulocytes: 0.06 10*3/uL (ref 0.00–0.07)
Basophils Absolute: 0 10*3/uL (ref 0.0–0.1)
Basophils Relative: 0 %
Eosinophils Absolute: 0 10*3/uL (ref 0.0–0.5)
Eosinophils Relative: 0 %
HCT: 43.9 % (ref 36.0–46.0)
Hemoglobin: 14.3 g/dL (ref 12.0–15.0)
Immature Granulocytes: 1 %
Lymphocytes Relative: 13 %
Lymphs Abs: 1.7 10*3/uL (ref 0.7–4.0)
MCH: 31.8 pg (ref 26.0–34.0)
MCHC: 32.6 g/dL (ref 30.0–36.0)
MCV: 97.8 fL (ref 80.0–100.0)
Monocytes Absolute: 0.8 10*3/uL (ref 0.1–1.0)
Monocytes Relative: 6 %
Neutro Abs: 10.2 10*3/uL — ABNORMAL HIGH (ref 1.7–7.7)
Neutrophils Relative %: 80 %
Platelets: 249 10*3/uL (ref 150–400)
RBC: 4.49 MIL/uL (ref 3.87–5.11)
RDW: 13.1 % (ref 11.5–15.5)
WBC: 12.8 10*3/uL — ABNORMAL HIGH (ref 4.0–10.5)
nRBC: 0 % (ref 0.0–0.2)

## 2020-03-25 LAB — URINALYSIS, ROUTINE W REFLEX MICROSCOPIC
Bilirubin Urine: NEGATIVE
Glucose, UA: NEGATIVE mg/dL
Ketones, ur: NEGATIVE mg/dL
Leukocytes,Ua: NEGATIVE
Nitrite: NEGATIVE
Protein, ur: 30 mg/dL — AB
Specific Gravity, Urine: >1.03 — ABNORMAL HIGH (ref 1.005–1.030)
pH: 5 (ref 5.0–8.0)

## 2020-03-25 LAB — URINE CULTURE: Culture: NO GROWTH

## 2020-03-25 LAB — COMPREHENSIVE METABOLIC PANEL
ALT: 21 U/L (ref 0–44)
AST: 22 U/L (ref 15–41)
Albumin: 4.4 g/dL (ref 3.5–5.0)
Alkaline Phosphatase: 65 U/L (ref 38–126)
Anion gap: 10 (ref 5–15)
BUN: 18 mg/dL (ref 6–20)
CO2: 24 mmol/L (ref 22–32)
Calcium: 8.9 mg/dL (ref 8.9–10.3)
Chloride: 103 mmol/L (ref 98–111)
Creatinine, Ser: 1.25 mg/dL — ABNORMAL HIGH (ref 0.44–1.00)
GFR calc Af Amer: 56 mL/min — ABNORMAL LOW (ref >60)
GFR calc non Af Amer: 48 mL/min — ABNORMAL LOW (ref >60)
Glucose, Bld: 146 mg/dL — ABNORMAL HIGH (ref 70–99)
Potassium: 3.5 mmol/L (ref 3.5–5.1)
Sodium: 137 mmol/L (ref 135–145)
Total Bilirubin: 0.7 mg/dL (ref 0.3–1.2)
Total Protein: 7.5 g/dL (ref 6.5–8.1)

## 2020-03-25 MED ORDER — HYDROMORPHONE HCL 1 MG/ML IJ SOLN
1.0000 mg | Freq: Once | INTRAMUSCULAR | Status: AC
  Administered 2020-03-25: 1 mg via INTRAVENOUS
  Filled 2020-03-25: qty 1

## 2020-03-25 MED ORDER — SODIUM CHLORIDE 0.9 % IV BOLUS
1000.0000 mL | Freq: Once | INTRAVENOUS | Status: AC
  Administered 2020-03-25: 1000 mL via INTRAVENOUS

## 2020-03-25 MED ORDER — KETOROLAC TROMETHAMINE 30 MG/ML IJ SOLN
30.0000 mg | Freq: Once | INTRAMUSCULAR | Status: AC
  Administered 2020-03-25: 30 mg via INTRAVENOUS
  Filled 2020-03-25: qty 1

## 2020-03-25 MED ORDER — ONDANSETRON HCL 4 MG/2ML IJ SOLN
4.0000 mg | Freq: Once | INTRAMUSCULAR | Status: AC
  Administered 2020-03-25: 4 mg via INTRAVENOUS
  Filled 2020-03-25: qty 2

## 2020-03-25 NOTE — Discharge Instructions (Addendum)
Do not take more than 4000mg  of tylenol/acetaminophen in one day. You have 325mg  of tylenol/acetaminophen in one of your oxycodone-acetaminophen tablets. You may take 1-2 tablets at one time, but need to watch the amount of tylenol you take in one day.  You may also increase the frequency of the tablets if you are taking one tablet at a time to every 4 hours.

## 2020-03-25 NOTE — ED Triage Notes (Signed)
Pt c/o right flank pain with N/V since 0430 today. Pt endorses hx of kidney stones, recent treatment

## 2020-03-25 NOTE — ED Provider Notes (Signed)
[facility] Provider Note   CSN: [id number] Arrival date & time: 03/25/20  1200     History Chief Complaint  Patient presents with  . Flank Pain    [facility] is a 54 y.o. female.  HPI      Presents with concern for right flank pain, diagnosed with kidney stone 14mm RIght flank pain, now also RLQ Nausea and vomiting 7/1 Oxycodone and phenergan, taking flomax at night Took phenergan at 940AM Was ok after getting home Thursday and yesterday then 430AM back started hurting agin Got up and took oxycodone and laid back down, helped for a little bit 630AM Had nausea at home and dry heaves but no emesis until arrival here No fevers Took 2 on empty stomach previously on Thursday Not taking other medication for pin   Past Medical History:  Diagnosis Date  . Family history of premature CAD   . Fatigue 2020   multifactoral  . Hypothyroid   . IBS (irritable bowel syndrome)   . mild nonobstructive CAD on cath 2009   . Palpitation   . Smoker   . Vitamin D deficiency     Patient Active Problem List   Diagnosis Date Noted  . Hot flashes 02/11/2020  . Encounter for hepatitis C screening test for low risk patient 01/11/2020  . DOE (dyspnea on exertion) 01/11/2020  . Vaccine counseling 01/11/2020  . Depression, major, single episode, moderate ([place]) 01/11/2020  . Chronic constipation 01/11/2020  . Menopausal symptom 01/11/2020  . COPD (chronic obstructive pulmonary disease) ([place]) 12/08/2019  . CAD (coronary artery disease), native coronary artery 12/08/2019  . Irritable bowel syndrome 07/16/2019  . Chronic bilateral back pain 12/08/2018  . Polyarthralgia 12/08/2018  . Vitamin D deficiency 12/08/2018  . Fatigue 09/08/2018  . Snoring 09/08/2018  . Family history of premature CAD 09/08/2018  . Chest tightness 11/21/2015  . Nicotine dependence 10/24/2011  . Allergic rhinitis 10/29/2007  . History of palpitations 04/08/2007  .  Hypothyroidism 07/01/2006  . Anxiety 07/01/2006    Past Surgical History:  Procedure Laterality Date  . CORONARY ANGIOGRAM  2009   mild CAD noted after abn GXT  . WRIST SURGERY       OB History   No obstetric history on file.     Family History  Problem Relation Age of Onset  . CAD Father        MI at age 32  . Heart disease Father        CABG x 3  . Sleep apnea Father   . Arrhythmia Father        pacemaker  . COPD Father   . Diabetes Father   . Thyroid disease Father   . Hyperlipidemia Father   . Diabetes Mother   . Other Mother        carcinoid tumor  . Hyperlipidemia Mother   . Crohn's disease Sister   . Cancer Maternal Aunt        colon    Social History   Tobacco Use  . Smoking status: Current Every Day Smoker    Packs/day: 0.50    Types: Cigarettes  . Smokeless tobacco: Never Used  Vaping Use  . Vaping Use: Never used  Substance Use Topics  . Alcohol use: No    Alcohol/week: 0.0 standard drinks  . Drug use: No    Home Medications Prior to Admission medications   Medication Sig Start Date End Date Taking? Authorizing Provider  albuterol (VENTOLIN  HFA) 108 (90 Base) MCG/ACT inhaler INHALE 2 PUFFS INTO THE LUNGS EVERY 6 HOURS AS NEEDED FOR WHEEZING OR SHORTNESS OF BREATH 01/12/20   Tysinger, Camelia Eng, PA-C  amLODipine (NORVASC) 5 MG tablet Take 1 tablet (5 mg total) by mouth daily. 02/02/20   Kroeger, Lorelee Cover., PA-C  ASPIRIN 81 PO Take 81 mg by mouth daily.    [provider]  buPROPion (WELLBUTRIN XL) 300 MG 24 hr tablet Take 1 tablet (300 mg total) by mouth daily. 01/12/20   Tysinger, Camelia Eng, PA-C  ELDERBERRY PO Take by mouth daily.    [provider]  Fluticasone-Umeclidin-Vilant (TRELEGY ELLIPTA) 100-62.5-25 MCG/INH AEPB Inhale 1 puff into the lungs daily. 01/17/20   Tysinger, Camelia Eng, PA-C  hydrOXYzine (ATARAX/VISTARIL) 10 MG tablet Take 1 tablet (10 mg total) by mouth 2 (two) times daily as needed. 02/25/20   Tysinger, Camelia Eng, PA-C    levothyroxine (SYNTHROID) 75 MCG tablet Take 1 tablet (75 mcg total) by mouth daily. 03/06/20 03/06/21  Tysinger, Camelia Eng, PA-C  metoprolol succinate (TOPROL-XL) 25 MG 24 hr tablet TAKE 1 TABLET(25 MG) BY MOUTH DAILY 02/28/20   Tysinger, Camelia Eng, PA-C  nitroGLYCERIN (NITROSTAT) 0.4 MG SL tablet Place 1 tablet (0.4 mg total) under the tongue every 5 (five) minutes as needed for chest pain. Patient not taking: Reported on 02/11/2020 12/08/19 03/07/20  Erlene Quan, PA-C  oxyCODONE-acetaminophen (PERCOCET/ROXICET) 5-325 MG tablet Take 1-2 tablets by mouth every 6 (six) hours as needed for severe pain. 03/23/20   Petrucelli, Glynda Jaeger, PA-C  promethazine (PHENERGAN) 12.5 MG tablet Take 1-2 tablets (12.5-25 mg total) by mouth every 6 (six) hours as needed for nausea or vomiting. 03/23/20   Petrucelli, Samantha R, PA-C  rosuvastatin (CRESTOR) 40 MG tablet Take 1 tablet (40 mg total) by mouth daily. 02/02/20   Kroeger, Lorelee Cover., PA-C  Vitamin D, Ergocalciferol, (DRISDOL) 1.25 MG (50000 UNIT) CAPS capsule Take 1 capsule (50,000 Units total) by mouth every 7 (seven) days. 03/06/20   Tysinger, Camelia Eng, PA-C    Allergies    Citalopram, Ciprofloxacin, Sulfonamide derivatives, and Sulfa antibiotics  Review of Systems   Review of Systems  Constitutional: Negative for fever.  HENT: Negative for sore throat.   Eyes: Negative for visual disturbance.  Respiratory: Negative for cough and shortness of breath.   Cardiovascular: Negative for chest pain.  Gastrointestinal: Positive for abdominal pain, constipation (nearly 5 times since last BM), nausea and vomiting. Negative for diarrhea.  Genitourinary: Negative for difficulty urinating and dysuria.  Musculoskeletal: Negative for back pain and neck pain.  Skin: Negative for rash.  Neurological: Negative for syncope and headaches.    Physical Exam Updated Vital Signs BP 115/67 (BP Location: Right Arm)   Pulse 64   Temp 98 F (36.7 C) (Oral)   Resp 16   Ht 5\' 7"   (1.702 m)   Wt 80.7 kg   SpO2 99%   BMI 27.88 kg/m   Physical Exam Vitals and nursing note reviewed.  Constitutional:      General: She is in acute distress (pain).     Appearance: She is well-developed. She is ill-appearing. She is not diaphoretic.  HENT:     Head: Normocephalic and atraumatic.  Eyes:     Conjunctiva/sclera: Conjunctivae normal.  Cardiovascular:     Rate and Rhythm: Normal rate and regular rhythm.  Pulmonary:     Effort: Pulmonary effort is normal. No respiratory distress.  Abdominal:     General: There is  no distension.     Palpations: Abdomen is soft.     Tenderness: There is abdominal tenderness (right sided, right lower flank tenderness). There is no guarding.  Musculoskeletal:        General: No tenderness.     Cervical back: Normal range of motion.  Skin:    General: Skin is warm and dry.     Findings: No erythema or rash.  Neurological:     Mental Status: She is alert and oriented to person, place, and time.     ED Results / Procedures / Treatments   Labs (all labs ordered are listed, but only abnormal results are displayed) Labs Reviewed  CBC WITH DIFFERENTIAL/PLATELET - Abnormal; Notable for the following components:      Result Value   WBC 12.8 (*)    Neutro Abs 10.2 (*)    All other components within normal limits  COMPREHENSIVE METABOLIC PANEL - Abnormal; Notable for the following components:   Glucose, Bld 146 (*)    Creatinine, Ser 1.25 (*)    GFR calc non Af Amer 48 (*)    GFR calc Af Amer 56 (*)    All other components within normal limits  URINALYSIS, ROUTINE W REFLEX MICROSCOPIC - Abnormal; Notable for the following components:   Specific Gravity, Urine >1.030 (*)    Hgb urine dipstick LARGE (*)    Protein, ur 30 (*)    All other components within normal limits  URINALYSIS, MICROSCOPIC (REFLEX) - Abnormal; Notable for the following components:   Bacteria, UA MANY (*)    All other components within normal limits  URINE  CULTURE    EKG None  Radiology No results found.  Procedures Procedures (including critical care time)  Medications Ordered in ED Medications  sodium chloride 0.9 % bolus 1,000 mL (0 mLs Intravenous Stopped 03/25/20 1357)  ketorolac (TORADOL) 30 MG/ML injection 30 mg (30 mg Intravenous Given 03/25/20 1242)  ondansetron (ZOFRAN) injection 4 mg (4 mg Intravenous Given 03/25/20 1358)  HYDROmorphone (DILAUDID) injection 1 mg (1 mg Intravenous Given 03/25/20 1358)  sodium chloride 0.9 % bolus 1,000 mL (0 mLs Intravenous Stopped 03/25/20 1524)  HYDROmorphone (DILAUDID) injection 1 mg (1 mg Intravenous Given 03/25/20 1453)    ED Course  I have reviewed the triage vital signs and the nursing notes.  Pertinent labs & imaging results that were available during my care of the patient were reviewed by me and considered in my medical decision making (see chart for details).    MDM Rules/Calculators/A&P                          54yo female who was diagnosed with nephrolithiasis 2 days ago presents with concern for severe pain and nausea.  Do not feel repeat imaging will change course of care today.  Had 29mm distal ureteral stone.  Urine culture negative.  Cr increasing .    Pain improved with IV medications. Recommend continued po pain and nausea control. Discussed with Dr. Rene Paci follow pu. Patient discharged in stable condition with understanding of reasons to return.   Final Clinical Impression(s) / ED Diagnoses Final diagnoses:  Nephrolithiasis    Rx / DC Orders ED Discharge Orders    None       Gareth Morgan, MD 03/25/20 2230

## 2020-03-26 LAB — URINE CULTURE: Culture: NO GROWTH

## 2020-03-28 ENCOUNTER — Other Ambulatory Visit: Payer: Self-pay

## 2020-03-28 ENCOUNTER — Emergency Department (HOSPITAL_COMMUNITY)
Admission: EM | Admit: 2020-03-28 | Discharge: 2020-03-28 | Disposition: A | Discharge status: Home / Self Care | Payer: 59 | Attending: Emergency Medicine | Admitting: Emergency Medicine

## 2020-03-28 ENCOUNTER — Encounter (HOSPITAL_COMMUNITY): Payer: Self-pay

## 2020-03-28 DIAGNOSIS — J449 Chronic obstructive pulmonary disease, unspecified: Secondary | ICD-10-CM | POA: Insufficient documentation

## 2020-03-28 DIAGNOSIS — E039 Hypothyroidism, unspecified: Secondary | ICD-10-CM | POA: Insufficient documentation

## 2020-03-28 DIAGNOSIS — Z79899 Other long term (current) drug therapy: Secondary | ICD-10-CM | POA: Diagnosis not present

## 2020-03-28 DIAGNOSIS — F17210 Nicotine dependence, cigarettes, uncomplicated: Secondary | ICD-10-CM | POA: Insufficient documentation

## 2020-03-28 DIAGNOSIS — I2510 Atherosclerotic heart disease of native coronary artery without angina pectoris: Secondary | ICD-10-CM | POA: Diagnosis not present

## 2020-03-28 DIAGNOSIS — R109 Unspecified abdominal pain: Secondary | ICD-10-CM | POA: Diagnosis present

## 2020-03-28 DIAGNOSIS — Z7982 Long term (current) use of aspirin: Secondary | ICD-10-CM | POA: Diagnosis not present

## 2020-03-28 DIAGNOSIS — N23 Unspecified renal colic: Secondary | ICD-10-CM | POA: Diagnosis not present

## 2020-03-28 LAB — CBC WITH DIFFERENTIAL/PLATELET
Abs Immature Granulocytes: 0.03 10*3/uL (ref 0.00–0.07)
Basophils Absolute: 0 10*3/uL (ref 0.0–0.1)
Basophils Relative: 0 %
Eosinophils Absolute: 0.1 10*3/uL (ref 0.0–0.5)
Eosinophils Relative: 1 %
HCT: 45.2 % (ref 36.0–46.0)
Hemoglobin: 14.9 g/dL (ref 12.0–15.0)
Immature Granulocytes: 0 %
Lymphocytes Relative: 21 %
Lymphs Abs: 2.4 10*3/uL (ref 0.7–4.0)
MCH: 32.4 pg (ref 26.0–34.0)
MCHC: 33 g/dL (ref 30.0–36.0)
MCV: 98.3 fL (ref 80.0–100.0)
Monocytes Absolute: 0.7 10*3/uL (ref 0.1–1.0)
Monocytes Relative: 6 %
Neutro Abs: 8.1 10*3/uL — ABNORMAL HIGH (ref 1.7–7.7)
Neutrophils Relative %: 72 %
Platelets: 250 10*3/uL (ref 150–400)
RBC: 4.6 MIL/uL (ref 3.87–5.11)
RDW: 13.2 % (ref 11.5–15.5)
WBC: 11.3 10*3/uL — ABNORMAL HIGH (ref 4.0–10.5)
nRBC: 0 % (ref 0.0–0.2)

## 2020-03-28 LAB — URINALYSIS, ROUTINE W REFLEX MICROSCOPIC
Bilirubin Urine: NEGATIVE
Glucose, UA: NEGATIVE mg/dL
Ketones, ur: NEGATIVE mg/dL
Leukocytes,Ua: NEGATIVE
Nitrite: NEGATIVE
Protein, ur: 30 mg/dL — AB
RBC / HPF: >50 RBC/hpf — ABNORMAL HIGH (ref 0–5)
Specific Gravity, Urine: 1.021 (ref 1.005–1.030)
pH: 5 (ref 5.0–8.0)

## 2020-03-28 LAB — BASIC METABOLIC PANEL
Anion gap: 11 (ref 5–15)
BUN: 17 mg/dL (ref 6–20)
CO2: 24 mmol/L (ref 22–32)
Calcium: 9.3 mg/dL (ref 8.9–10.3)
Chloride: 106 mmol/L (ref 98–111)
Creatinine, Ser: 1.1 mg/dL — ABNORMAL HIGH (ref 0.44–1.00)
GFR calc Af Amer: >60 mL/min (ref >60)
GFR calc non Af Amer: 56 mL/min — ABNORMAL LOW (ref >60)
Glucose, Bld: 148 mg/dL — ABNORMAL HIGH (ref 70–99)
Potassium: 3.5 mmol/L (ref 3.5–5.1)
Sodium: 141 mmol/L (ref 135–145)

## 2020-03-28 MED ORDER — HYDROMORPHONE HCL 1 MG/ML IJ SOLN
1.0000 mg | Freq: Once | INTRAMUSCULAR | Status: AC
  Administered 2020-03-28: 1 mg via INTRAVENOUS
  Filled 2020-03-28: qty 1

## 2020-03-28 MED ORDER — KETOROLAC TROMETHAMINE 30 MG/ML IJ SOLN
30.0000 mg | Freq: Once | INTRAMUSCULAR | Status: AC
  Administered 2020-03-28: 30 mg via INTRAVENOUS
  Filled 2020-03-28: qty 1

## 2020-03-28 MED ORDER — PROMETHAZINE HCL 25 MG/ML IJ SOLN
12.5000 mg | Freq: Once | INTRAMUSCULAR | Status: AC
  Administered 2020-03-28: 12.5 mg via INTRAVENOUS
  Filled 2020-03-28: qty 1

## 2020-03-28 NOTE — ED Provider Notes (Signed)
[facility] DEPT Provider Note   CSN: [id number] Arrival date & time: 03/28/20  0545     History Chief Complaint  Patient presents with  . Flank Pain    [facility] is a 54 y.o. female.  Patient to ED with persistent right flank pain, nausea and vomiting. Diagnosed 03/23/20 with 2 mm distal ureteral stone and discharged home with Percocet and Phenergan. She returned to the ED 03/25/20 with continued/uncontrolled pain. No fever.   The history is provided by the patient and a parent. No language interpreter was used.       Past Medical History:  Diagnosis Date  . Family history of premature CAD   . Fatigue 2020   multifactoral  . Hypothyroid   . IBS (irritable bowel syndrome)   . mild nonobstructive CAD on cath 2009   . Palpitation   . Smoker   . Vitamin D deficiency     Patient Active Problem List   Diagnosis Date Noted  . Hot flashes 02/11/2020  . Encounter for hepatitis C screening test for low risk patient 01/11/2020  . DOE (dyspnea on exertion) 01/11/2020  . Vaccine counseling 01/11/2020  . Depression, major, single episode, moderate ([place]) 01/11/2020  . Chronic constipation 01/11/2020  . Menopausal symptom 01/11/2020  . COPD (chronic obstructive pulmonary disease) ([place]) 12/08/2019  . CAD (coronary artery disease), native coronary artery 12/08/2019  . Irritable bowel syndrome 07/16/2019  . Chronic bilateral back pain 12/08/2018  . Polyarthralgia 12/08/2018  . Vitamin D deficiency 12/08/2018  . Fatigue 09/08/2018  . Snoring 09/08/2018  . Family history of premature CAD 09/08/2018  . Chest tightness 11/21/2015  . Nicotine dependence 10/24/2011  . Allergic rhinitis 10/29/2007  . History of palpitations 04/08/2007  . Hypothyroidism 07/01/2006  . Anxiety 07/01/2006    Past Surgical History:  Procedure Laterality Date  . CORONARY ANGIOGRAM  2009   mild CAD noted after abn GXT  . WRIST SURGERY       OB History   No obstetric  history on file.     Family History  Problem Relation Age of Onset  . CAD Father        MI at age 55  . Heart disease Father        CABG x 3  . Sleep apnea Father   . Arrhythmia Father        pacemaker  . COPD Father   . Diabetes Father   . Thyroid disease Father   . Hyperlipidemia Father   . Diabetes Mother   . Other Mother        carcinoid tumor  . Hyperlipidemia Mother   . Crohn's disease Sister   . Cancer Maternal Aunt        colon    Social History   Tobacco Use  . Smoking status: Current Every Day Smoker    Packs/day: 0.50    Types: Cigarettes  . Smokeless tobacco: Never Used  Vaping Use  . Vaping Use: Never used  Substance Use Topics  . Alcohol use: No    Alcohol/week: 0.0 standard drinks  . Drug use: No    Home Medications Prior to Admission medications   Medication Sig Start Date End Date Taking? Authorizing Provider  albuterol (VENTOLIN HFA) 108 (90 Base) MCG/ACT inhaler INHALE 2 PUFFS INTO THE LUNGS EVERY 6 HOURS AS NEEDED FOR WHEEZING OR SHORTNESS OF BREATH 01/12/20   Tysinger, Camelia Eng, PA-C  amLODipine (NORVASC) 5 MG tablet Take 1 tablet (  5 mg total) by mouth daily. 02/02/20   Kroeger, Lorelee Cover., PA-C  ASPIRIN 81 PO Take 81 mg by mouth daily.    [provider]  buPROPion (WELLBUTRIN XL) 300 MG 24 hr tablet Take 1 tablet (300 mg total) by mouth daily. 01/12/20   Tysinger, Camelia Eng, PA-C  ELDERBERRY PO Take by mouth daily.    [provider]  Fluticasone-Umeclidin-Vilant (TRELEGY ELLIPTA) 100-62.5-25 MCG/INH AEPB Inhale 1 puff into the lungs daily. 01/17/20   Tysinger, Camelia Eng, PA-C  hydrOXYzine (ATARAX/VISTARIL) 10 MG tablet Take 1 tablet (10 mg total) by mouth 2 (two) times daily as needed. 02/25/20   Tysinger, Camelia Eng, PA-C  levothyroxine (SYNTHROID) 75 MCG tablet Take 1 tablet (75 mcg total) by mouth daily. 03/06/20 03/06/21  Tysinger, Camelia Eng, PA-C  metoprolol succinate (TOPROL-XL) 25 MG 24 hr tablet TAKE 1 TABLET(25 MG) BY MOUTH DAILY  02/28/20   Tysinger, Camelia Eng, PA-C  nitroGLYCERIN (NITROSTAT) 0.4 MG SL tablet Place 1 tablet (0.4 mg total) under the tongue every 5 (five) minutes as needed for chest pain. Patient not taking: Reported on 02/11/2020 12/08/19 03/07/20  Erlene Quan, PA-C  oxyCODONE-acetaminophen (PERCOCET/ROXICET) 5-325 MG tablet Take 1-2 tablets by mouth every 6 (six) hours as needed for severe pain. 03/23/20   Petrucelli, Glynda Jaeger, PA-C  promethazine (PHENERGAN) 12.5 MG tablet Take 1-2 tablets (12.5-25 mg total) by mouth every 6 (six) hours as needed for nausea or vomiting. 03/23/20   Petrucelli, Samantha R, PA-C  rosuvastatin (CRESTOR) 40 MG tablet Take 1 tablet (40 mg total) by mouth daily. 02/02/20   Kroeger, Lorelee Cover., PA-C  Vitamin D, Ergocalciferol, (DRISDOL) 1.25 MG (50000 UNIT) CAPS capsule Take 1 capsule (50,000 Units total) by mouth every 7 (seven) days. 03/06/20   Tysinger, Camelia Eng, PA-C    Allergies    Citalopram, Ciprofloxacin, Sulfonamide derivatives, and Sulfa antibiotics  Review of Systems   Review of Systems  Constitutional: Negative for chills and fever.  Gastrointestinal: Positive for abdominal pain, nausea and vomiting.  Genitourinary: Positive for flank pain.  Skin: Negative.   Neurological: Negative.     Physical Exam Updated Vital Signs BP (!) 144/80 (BP Location: Left Arm)   Pulse 60   Temp 97.8 F (36.6 C) (Oral)   Resp 19   Ht 5\' 7"  (1.702 m)   Wt 80.7 kg   SpO2 98%   BMI 27.88 kg/m   Physical Exam Vitals and nursing note reviewed.  Constitutional:      Appearance: She is not ill-appearing or toxic-appearing.     Comments: Uncomfortable appearing.   Cardiovascular:     Rate and Rhythm: Normal rate.  Pulmonary:     Effort: Pulmonary effort is normal.  Abdominal:     Palpations: Abdomen is soft.     Comments: RLQ tenderness without guarding.  Musculoskeletal:        General: Normal range of motion.  Skin:    General: Skin is warm and dry.  Neurological:      Mental Status: She is alert and oriented to person, place, and time.     ED Results / Procedures / Treatments   Labs (all labs ordered are listed, but only abnormal results are displayed) Labs Reviewed  BASIC METABOLIC PANEL  URINALYSIS, ROUTINE W REFLEX MICROSCOPIC   Results for orders placed or performed during the hospital encounter of 36/46/80  Basic metabolic panel  Result Value Ref Range   Sodium 141 135 - 145 mmol/L   Potassium  3.5 3.5 - 5.1 mmol/L   Chloride 106 98 - 111 mmol/L   CO2 24 22 - 32 mmol/L   Glucose, Bld 148 (H) 70 - 99 mg/dL   BUN 17 6 - 20 mg/dL   Creatinine, Ser 1.10 (H) 0.44 - 1.00 mg/dL   Calcium 9.3 8.9 - 10.3 mg/dL   GFR calc non Af Amer 56 (L) >60 mL/min   GFR calc Af Amer >60 >60 mL/min   Anion gap 11 5 - 15  CBC with Differential  Result Value Ref Range   WBC 11.3 (H) 4.0 - 10.5 K/uL   RBC 4.60 3.87 - 5.11 MIL/uL   Hemoglobin 14.9 12.0 - 15.0 g/dL   HCT 45.2 36 - 46 %   MCV 98.3 80.0 - 100.0 fL   MCH 32.4 26.0 - 34.0 pg   MCHC 33.0 30.0 - 36.0 g/dL   RDW 13.2 11.5 - 15.5 %   Platelets 250 150 - 400 K/uL   nRBC 0.0 0.0 - 0.2 %   Neutrophils Relative % 72 %   Neutro Abs 8.1 (H) 1.7 - 7.7 K/uL   Lymphocytes Relative 21 %   Lymphs Abs 2.4 0.7 - 4.0 K/uL   Monocytes Relative 6 %   Monocytes Absolute 0.7 0 - 1 K/uL   Eosinophils Relative 1 %   Eosinophils Absolute 0.1 0 - 0 K/uL   Basophils Relative 0 %   Basophils Absolute 0.0 0 - 0 K/uL   Immature Granulocytes 0 %   Abs Immature Granulocytes 0.03 0.00 - 0.07 K/uL     EKG None  Radiology No results found.  Procedures Procedures (including critical care time)  Medications Ordered in ED Medications  HYDROmorphone (DILAUDID) injection 1 mg (has no administration in time range)  promethazine (PHENERGAN) injection 12.5 mg (has no administration in time range)    ED Course  I have reviewed the triage vital signs and the nursing notes.  Pertinent labs & imaging results that were  available during my care of the patient were reviewed by me and considered in my medical decision making (see chart for details).    MDM Rules/Calculators/A&P                          Patient returns to the ED with continuous right flank pain, nausea and vomiting since diagnosis of right 2 mm distal ureteral stone 03/23/20. No fever.   She appears uncomfortable. CT scan 7/1 established diagnosis, no repeat imaging on 7/3 ED visit. Labs showed bump in creatinine and mild leukocytosis that have improved tonight.   IV medications provided. Patient care signed out to oncoming provider for recheck of pain management and to decide further evaluation to determine disposition.   Final Clinical Impression(s) / ED Diagnoses Final diagnoses:  None   1. Flank pain  Rx / DC Orders ED Discharge Orders    None       Charlann Lange, PA-C 04/01/20 [place], Delice Bison, DO 04/04/20 [phone]

## 2020-03-28 NOTE — Discharge Instructions (Addendum)
Today you received medications that may make you sleepy or impair your ability to make decisions.  For the next 24 hours please do not drive, operate heavy machinery, care for a small child with out another adult present, or perform any activities that may cause harm to you or someone else if you were to fall asleep or be impaired.   You are being prescribed a medication which may make you sleepy. Please follow up of listed precautions for at least 24 hours after taking one dose.

## 2020-03-28 NOTE — ED Triage Notes (Signed)
Pt sts right sided flank pain since thursday night. Seen twice by [facility]. Last calculi 7 months ago.

## 2020-03-28 NOTE — ED Provider Notes (Signed)
I assumed care of patient from previous team at shift change, please see their note for full H&P.  Briefly patient is here for evaluation of continued pain in the setting of renal stone. Physical Exam  BP 107/64 (BP Location: Left Arm)   Pulse (!) 56   Temp 98.4 F (36.9 C) (Oral)   Resp 18   Ht 5\' 7"  (1.702 m)   Wt 80.7 kg   SpO2 98%   BMI 27.88 kg/m   Plan is to reevaluate patient pain.  She has a 2 mm distal right ureteral stone.    Patient got Toradol.  Her pain is improved.  She is laying comfortably in bed in no distress. She has over 10 Percocet and enough Zofran at home.  Recommended outpatient urology follow up.    Return precautions were discussed with patient who states their understanding.  At the time of discharge patient denied any unaddressed complaints or concerns.  Patient is agreeable for discharge home.  Note: Portions of this report may have been transcribed using voice recognition software. Every effort was made to ensure accuracy; however, inadvertent computerized transcription errors may be present       Lorin Glass, PA-C 03/28/20 1527    [place], Delice Bison, DO 03/29/20 0011

## 2020-03-29 ENCOUNTER — Telehealth: Payer: Self-pay

## 2020-03-29 NOTE — Telephone Encounter (Signed)
We can either work her in for acute pain eval, or call [facility] to see if they can work her in ASAP given active kidney stone, swelling of the kidney  I believe there is urology offices in [place] and possibly [place]

## 2020-03-29 NOTE — Telephone Encounter (Signed)
Patient stated she has had a kidney stone issue going on or months. She has only passed 1. She has another one that's giving her a lot of problem. She is taking Flomax and has been seen at the hospital. [facility] can't see her until August 10th and she wants to go somewhere that she can be see sooner because she is in a lot of pain.

## 2020-03-29 NOTE — Telephone Encounter (Signed)
Pt. Called wanting to speak with you but wouldn't let me know what it was about if you could call back at [phone].

## 2020-03-30 ENCOUNTER — Emergency Department (HOSPITAL_BASED_OUTPATIENT_CLINIC_OR_DEPARTMENT_OTHER): Payer: 59

## 2020-03-30 ENCOUNTER — Other Ambulatory Visit: Payer: Self-pay

## 2020-03-30 ENCOUNTER — Emergency Department (HOSPITAL_BASED_OUTPATIENT_CLINIC_OR_DEPARTMENT_OTHER)
Admission: EM | Admit: 2020-03-30 | Discharge: 2020-03-30 | Disposition: A | Discharge status: Home / Self Care | Payer: 59 | Attending: Emergency Medicine | Admitting: Emergency Medicine

## 2020-03-30 ENCOUNTER — Encounter (HOSPITAL_BASED_OUTPATIENT_CLINIC_OR_DEPARTMENT_OTHER): Payer: Self-pay | Admitting: Emergency Medicine

## 2020-03-30 DIAGNOSIS — J449 Chronic obstructive pulmonary disease, unspecified: Secondary | ICD-10-CM | POA: Diagnosis not present

## 2020-03-30 DIAGNOSIS — E039 Hypothyroidism, unspecified: Secondary | ICD-10-CM | POA: Diagnosis not present

## 2020-03-30 DIAGNOSIS — R109 Unspecified abdominal pain: Secondary | ICD-10-CM | POA: Insufficient documentation

## 2020-03-30 DIAGNOSIS — F17210 Nicotine dependence, cigarettes, uncomplicated: Secondary | ICD-10-CM | POA: Insufficient documentation

## 2020-03-30 DIAGNOSIS — N200 Calculus of kidney: Secondary | ICD-10-CM

## 2020-03-30 DIAGNOSIS — I2510 Atherosclerotic heart disease of native coronary artery without angina pectoris: Secondary | ICD-10-CM | POA: Diagnosis not present

## 2020-03-30 DIAGNOSIS — R6883 Chills (without fever): Secondary | ICD-10-CM | POA: Diagnosis not present

## 2020-03-30 LAB — URINALYSIS, MICROSCOPIC (REFLEX)

## 2020-03-30 LAB — CBC WITH DIFFERENTIAL/PLATELET
Abs Immature Granulocytes: 0.05 10*3/uL (ref 0.00–0.07)
Basophils Absolute: 0 10*3/uL (ref 0.0–0.1)
Basophils Relative: 0 %
Eosinophils Absolute: 0.1 10*3/uL (ref 0.0–0.5)
Eosinophils Relative: 1 %
HCT: 44.5 % (ref 36.0–46.0)
Hemoglobin: 14.6 g/dL (ref 12.0–15.0)
Immature Granulocytes: 0 %
Lymphocytes Relative: 12 %
Lymphs Abs: 1.5 10*3/uL (ref 0.7–4.0)
MCH: 31.8 pg (ref 26.0–34.0)
MCHC: 32.8 g/dL (ref 30.0–36.0)
MCV: 96.9 fL (ref 80.0–100.0)
Monocytes Absolute: 0.6 10*3/uL (ref 0.1–1.0)
Monocytes Relative: 5 %
Neutro Abs: 10.3 10*3/uL — ABNORMAL HIGH (ref 1.7–7.7)
Neutrophils Relative %: 82 %
Platelets: 239 10*3/uL (ref 150–400)
RBC: 4.59 MIL/uL (ref 3.87–5.11)
RDW: 13.2 % (ref 11.5–15.5)
WBC: 12.6 10*3/uL — ABNORMAL HIGH (ref 4.0–10.5)
nRBC: 0 % (ref 0.0–0.2)

## 2020-03-30 LAB — URINALYSIS, ROUTINE W REFLEX MICROSCOPIC
Bilirubin Urine: NEGATIVE
Glucose, UA: NEGATIVE mg/dL
Ketones, ur: NEGATIVE mg/dL
Leukocytes,Ua: NEGATIVE
Nitrite: NEGATIVE
Protein, ur: NEGATIVE mg/dL
Specific Gravity, Urine: <1.005 — ABNORMAL LOW (ref 1.005–1.030)
pH: 5.5 (ref 5.0–8.0)

## 2020-03-30 LAB — COMPREHENSIVE METABOLIC PANEL
ALT: 18 U/L (ref 0–44)
AST: 17 U/L (ref 15–41)
Albumin: 4.2 g/dL (ref 3.5–5.0)
Alkaline Phosphatase: 62 U/L (ref 38–126)
Anion gap: 11 (ref 5–15)
BUN: 15 mg/dL (ref 6–20)
CO2: 28 mmol/L (ref 22–32)
Calcium: 9.1 mg/dL (ref 8.9–10.3)
Chloride: 104 mmol/L (ref 98–111)
Creatinine, Ser: 0.92 mg/dL (ref 0.44–1.00)
GFR calc Af Amer: >60 mL/min (ref >60)
GFR calc non Af Amer: >60 mL/min (ref >60)
Glucose, Bld: 123 mg/dL — ABNORMAL HIGH (ref 70–99)
Potassium: 3.4 mmol/L — ABNORMAL LOW (ref 3.5–5.1)
Sodium: 143 mmol/L (ref 135–145)
Total Bilirubin: 0.6 mg/dL (ref 0.3–1.2)
Total Protein: 7.4 g/dL (ref 6.5–8.1)

## 2020-03-30 IMAGING — US US RENAL
1 series · 13 of 25 positions shown · non-contrast
Comparison: CT abdomen and pelvis [DATE]

CLINICAL DATA: Hydronephrosis with recently demonstrated distal
right ureteral calculus

EXAM:
RENAL / URINARY TRACT ULTRASOUND COMPLETE

[Series 1: us renal · 13 of 50 slices shown]
[im 1/50]
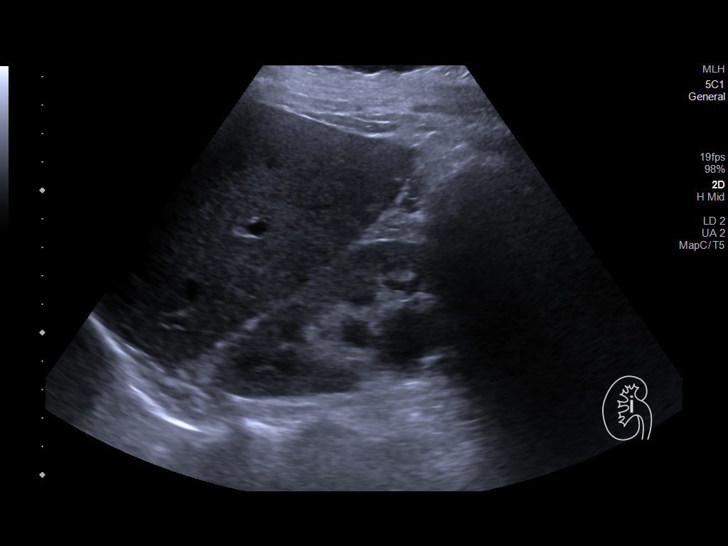
[im 5/50]
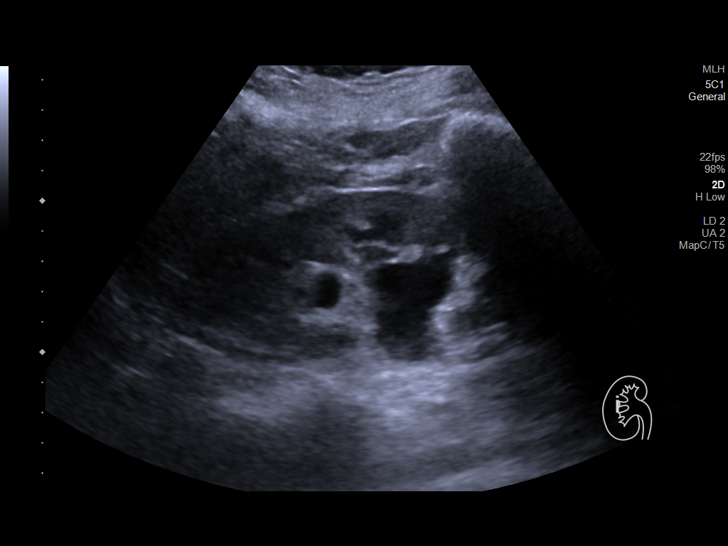
[im 9/50]
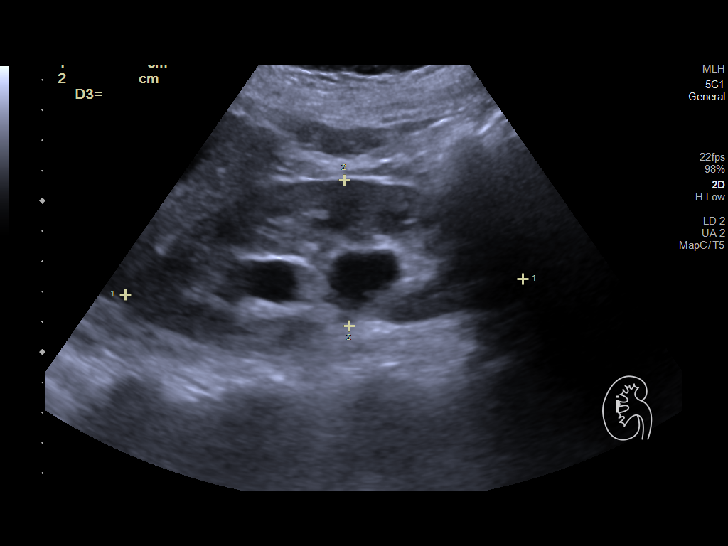
[im 13/50]
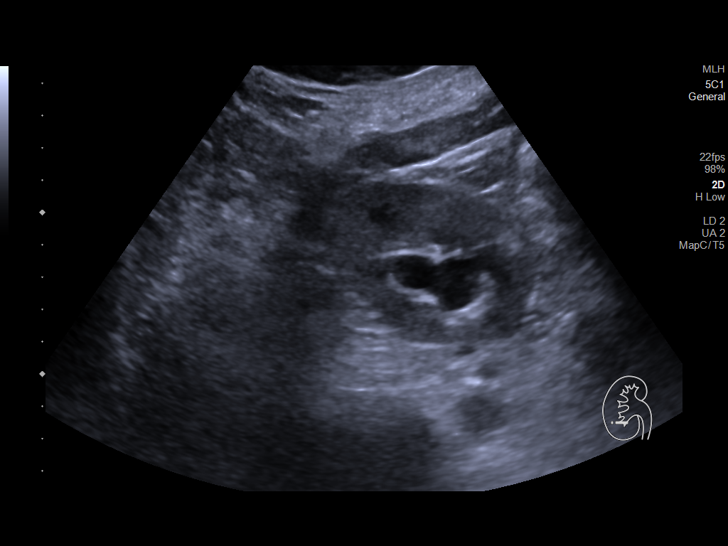
[im 17/50]
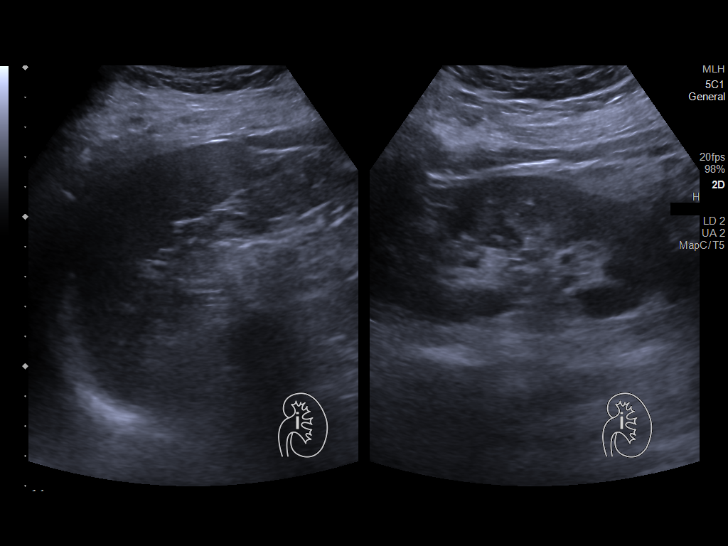
[im 21/50]
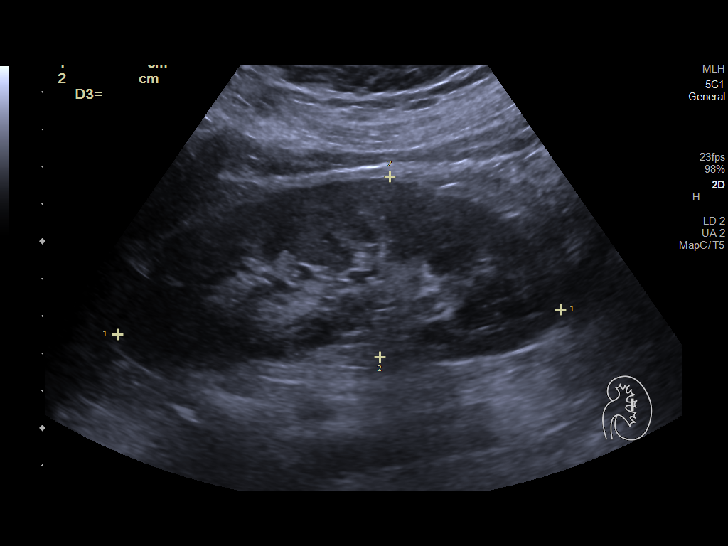
[im 25/50]
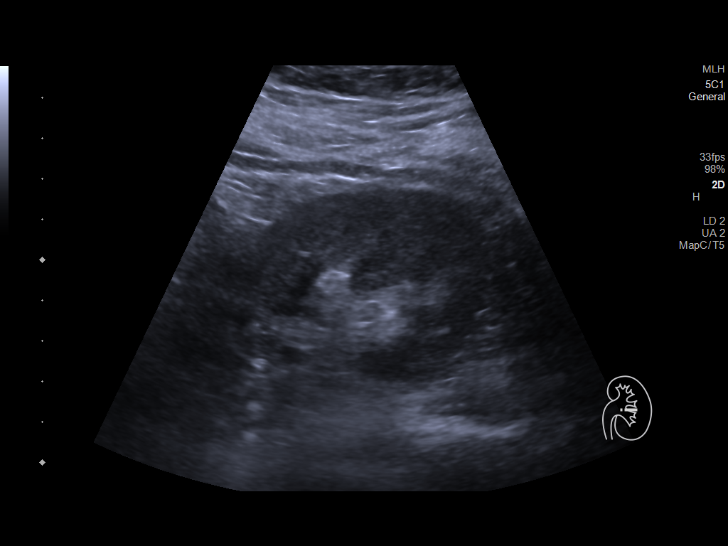
[im 29/50]
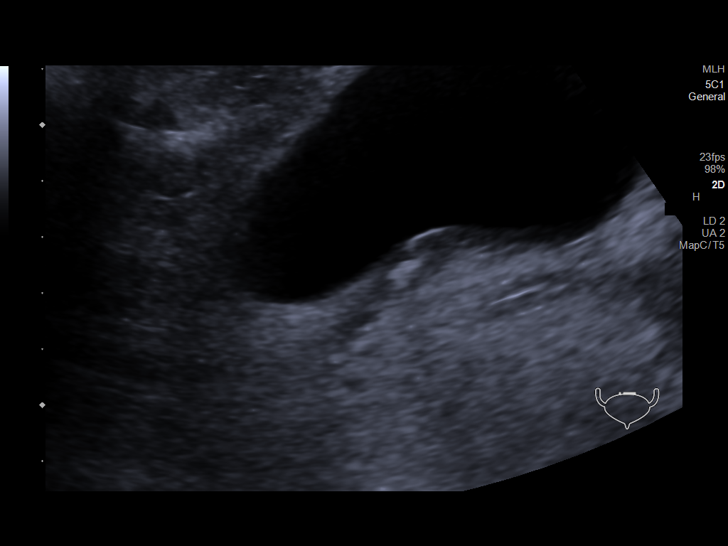
[im 33/50]
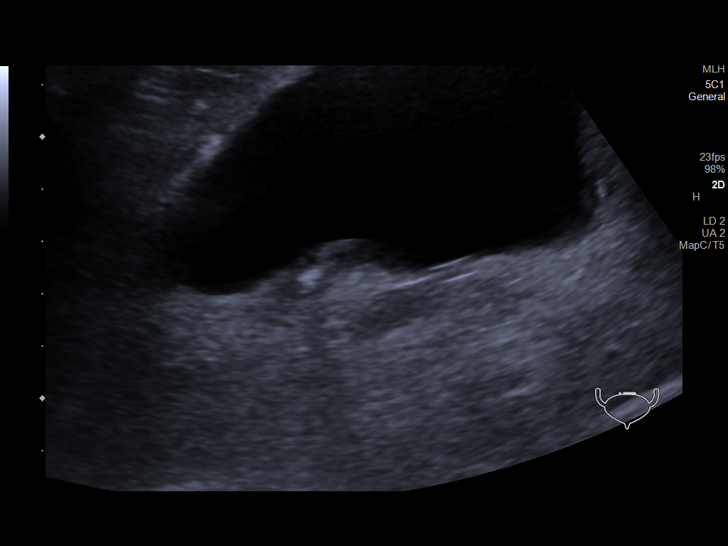
[im 37/50]
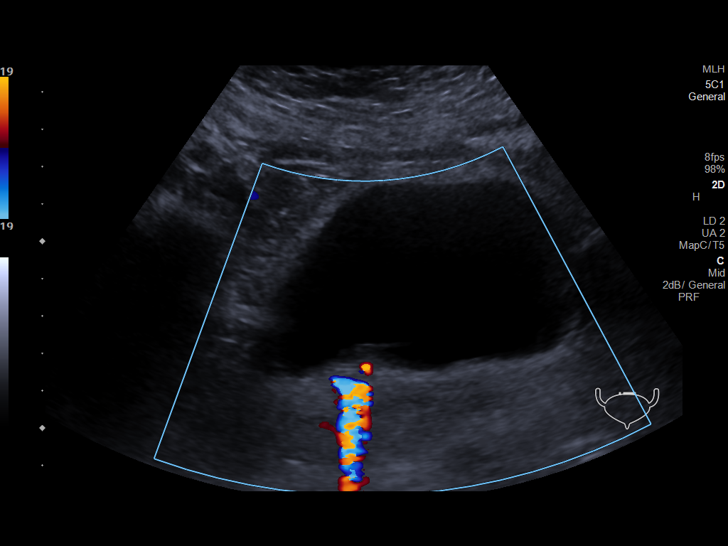
[im 41/50]
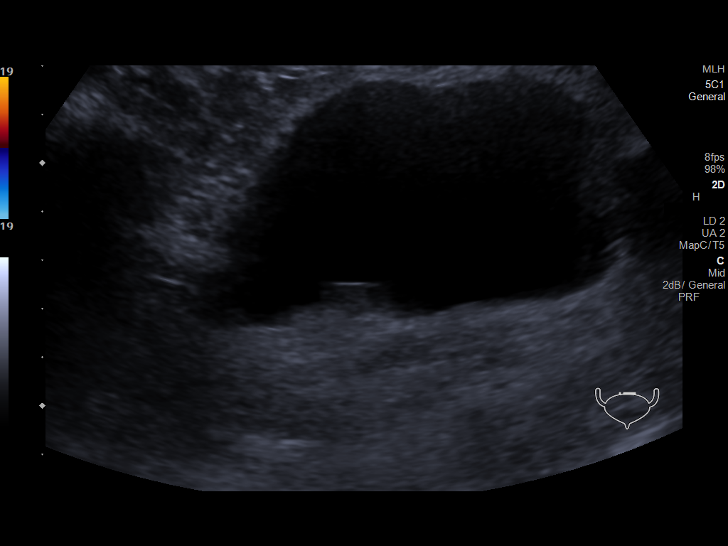
[im 45/50]
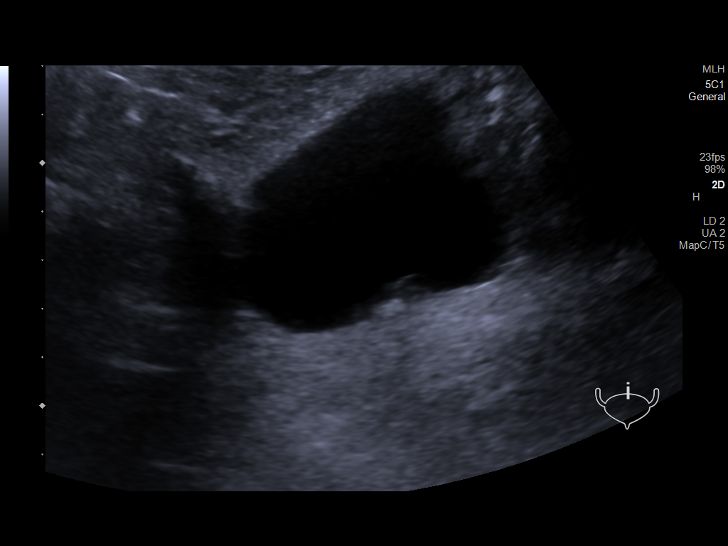
[im 50/50]
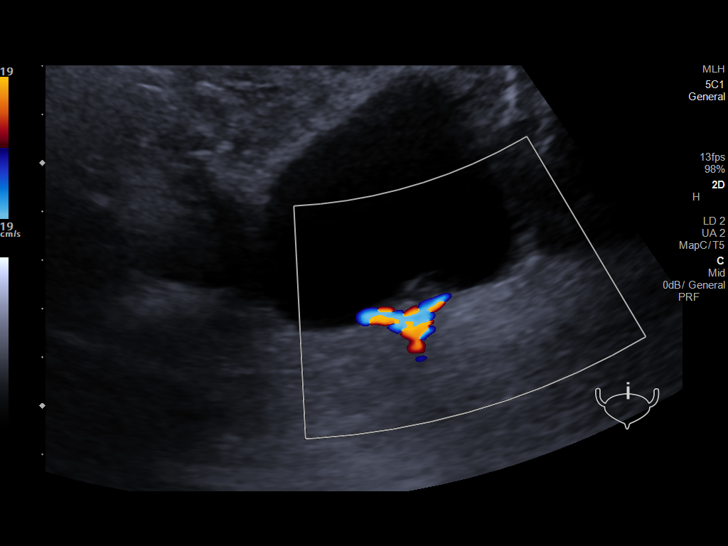

[13 of 25 positions shown; findings below may reference images not displayed]

FINDINGS: Right Kidney:

Renal measurements: 13.1 x 4.8 x 6.4 cm = volume: 211 mL .
Echogenicity and renal cortical thickness are within normal limits.
No mass or perinephric fluid visualized. There is moderate
hydronephrosis on the right. There is ureterectasis to the level of
the right ureterovesical junction. At the right ureterovesical
junction there is a 6 mm calculus with surrounding edema. No flow is
seen in the bladder on the right.

Left Kidney:

Renal measurements: 11.9 x 4.8 x 4.9 cm = volume: 148 mL.
Echogenicity within normal limits. No mass, perinephric fluid, or
hydronephrosis visualized. No sonographically demonstrable calculus
or ureterectasis.

Bladder:

Appears normal for degree of bladder distention.

Other:

None.
IMPRESSION: Persistent moderate hydronephrosis on the right. There is right
ureterectasis with a 6 mm calculus at the right ureterovesical
junction. There appears to be localized ureteral edema in this area.

Study otherwise unremarkable.

These results will be called to the ordering clinician or
representative by the Radiologist Assistant, and communication
documented in the PACS or [REDACTED].

## 2020-03-30 IMAGING — CT CT RENAL STONE PROTOCOL
2 of 4 series · 16 of 46 positions shown, 18 images · non-contrast
Comparison: [DATE]

CLINICAL DATA: Right flank pain, nausea, nephrolithiasis

EXAM:
CT ABDOMEN AND PELVIS WITHOUT CONTRAST
TECHNIQUE: Multidetector CT imaging of the abdomen and pelvis was performed
following the standard protocol without IV contrast.

[Series 2: axial st · axial · 0.96mm/px · z∈[-498,-48]mm · 13 of 98 slices shown, 15 images]
[im 4/98  soft-tissue]
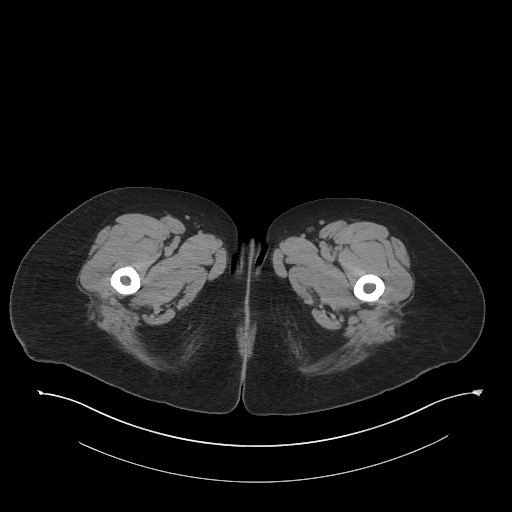
[im 4/98  bone]
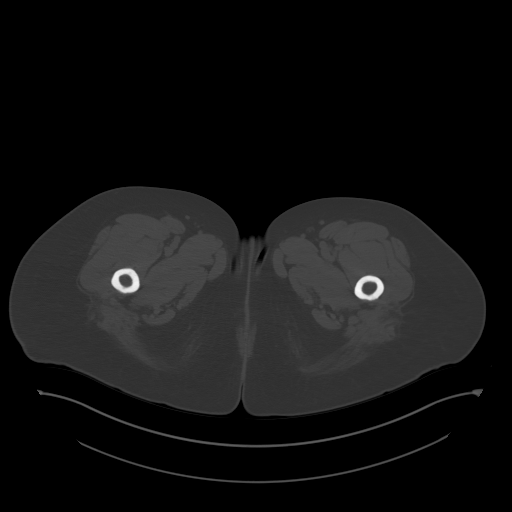
[im 12/98  soft-tissue]
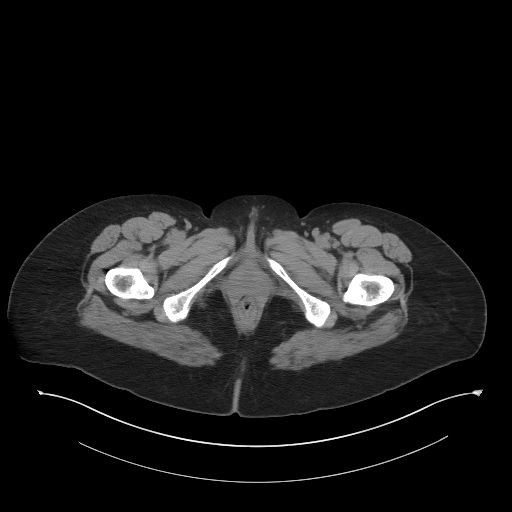
[im 20/98  soft-tissue]
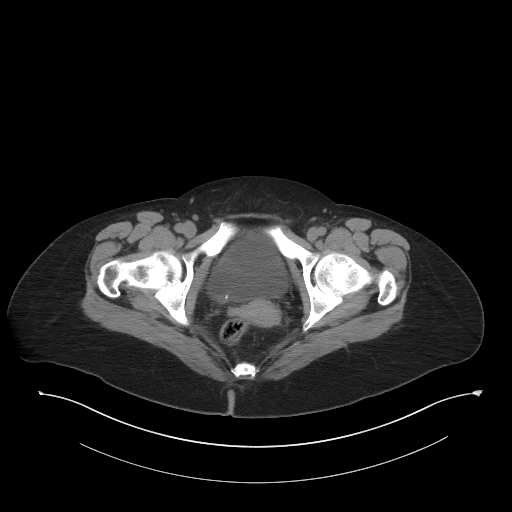
[im 28/98  soft-tissue]
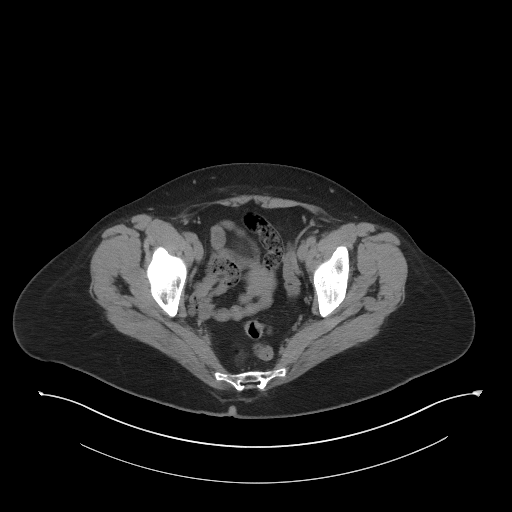
[im 35/98  soft-tissue]
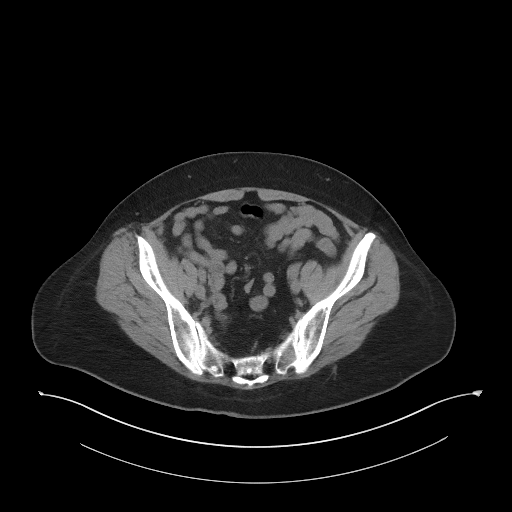
[im 43/98  soft-tissue]
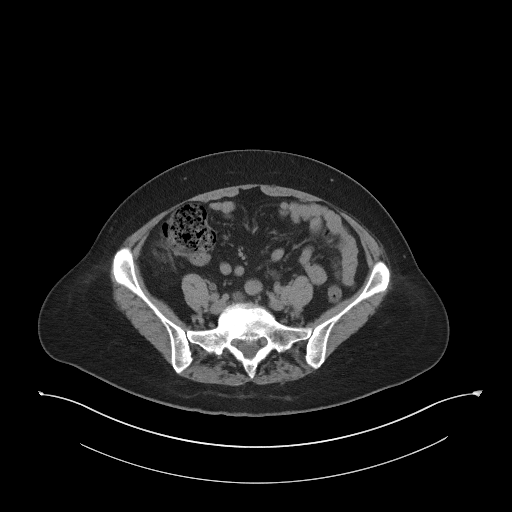
[im 51/98  soft-tissue]
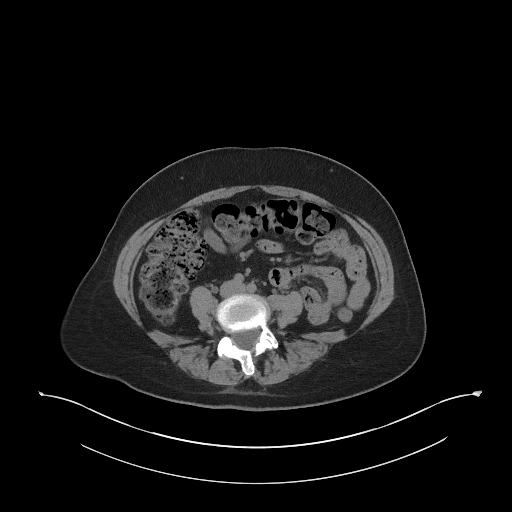
[im 55/98  soft-tissue]
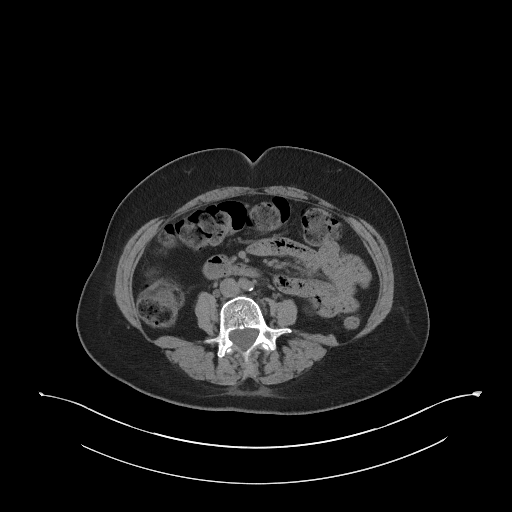
[im 63/98  soft-tissue]
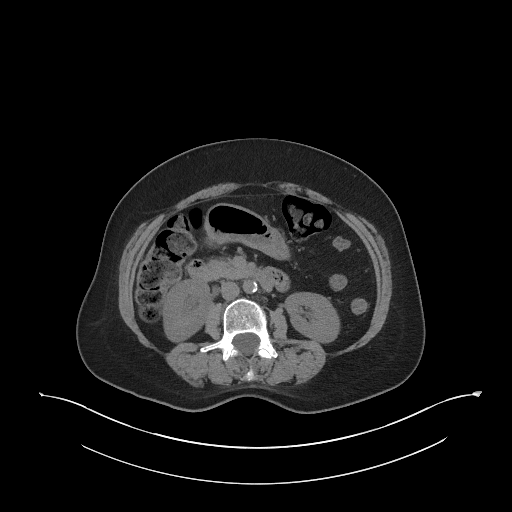
[im 63/98  bone]
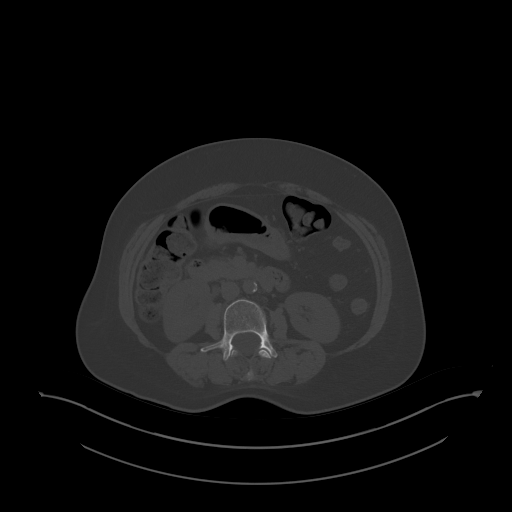
[im 70/98  soft-tissue]
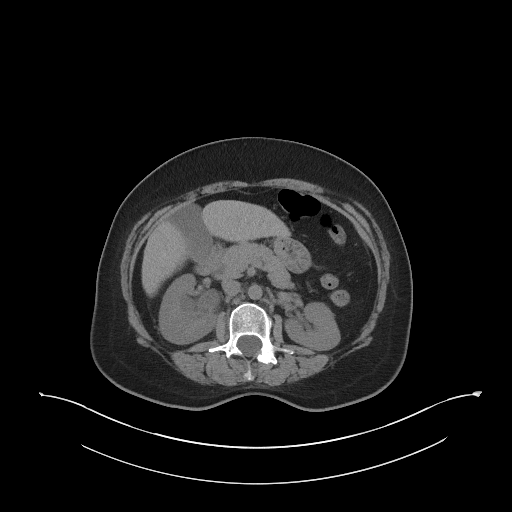
[im 78/98  soft-tissue]
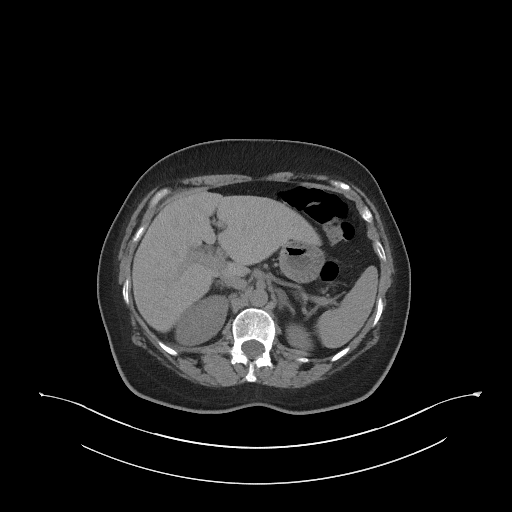
[im 86/98  soft-tissue]
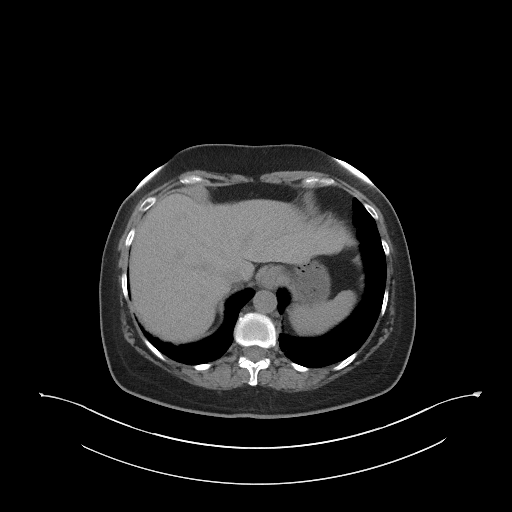
[im 94/98  soft-tissue]
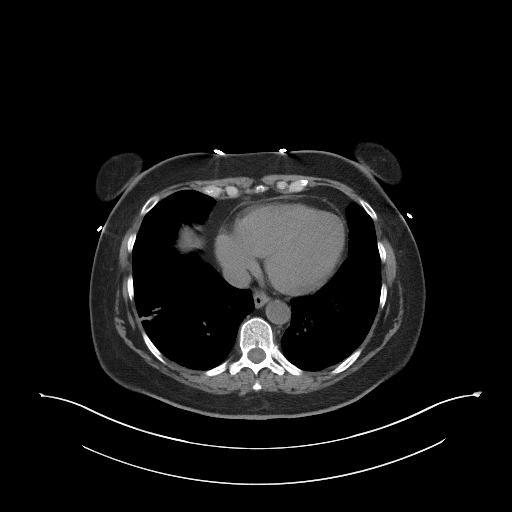

[Series 5: coronal st · coronal · 0.87mm/px · 3 of 92 slices shown]
[im 31/92  soft-tissue]
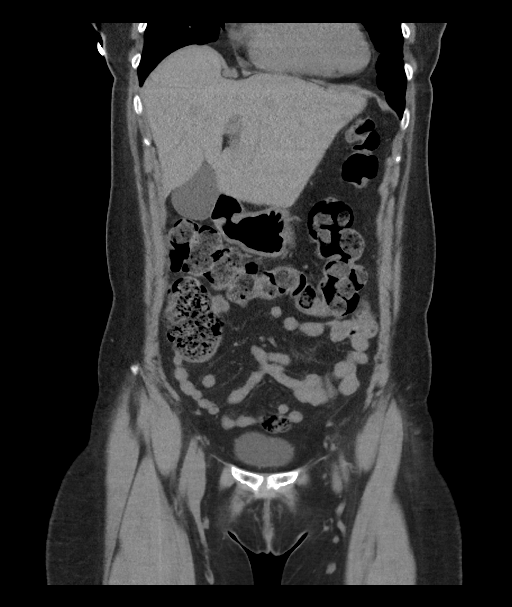
[im 41/92  soft-tissue]
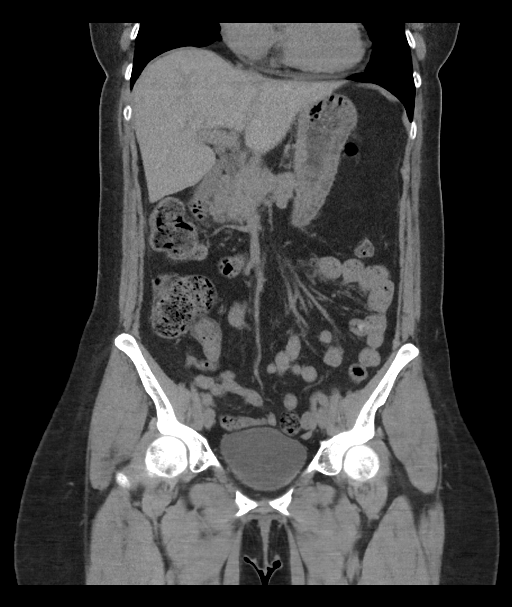
[im 51/92  soft-tissue]
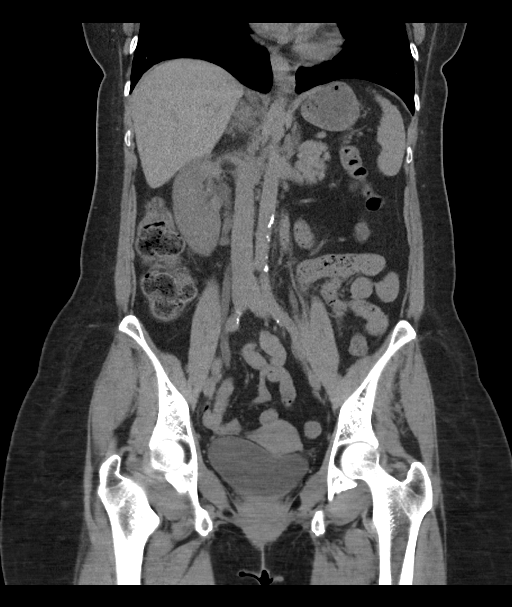

[16 of 46 positions shown; findings below may reference images not displayed]

FINDINGS: Lower chest: Mild bibasilar atelectasis. The visualized heart and
pericardium are unremarkable. Small hiatal hernia.

Hepatobiliary: Liver and gallbladder are unremarkable.

Pancreas: Unremarkable

Spleen: Unremarkable

Adrenals/Urinary Tract: The adrenal glands are unremarkable.

The kidneys are normal in size and position. Right-sided
hydronephrosis has progressed and is now mild-to-moderate in
severity. The previously noted obstructing 4 mm distal ureteral
calculus has progressed distally and is now seen within the
intramural ureterovesicular junction protruding into the bladder
lumen, best seen on axial image 79/2. Minimal superimposed
nonobstructing nephrolithiasis within the lower pole of the right
kidney with 1-2 punctate calculi is again noted. No nephro or
urolithiasis on the left. No hydronephrosis on the left. The bladder
is mildly distended but is otherwise unremarkable.

Stomach/Bowel: Stomach is within normal limits. Appendix appears
normal. No evidence of bowel wall thickening, distention, or
inflammatory changes.

Vascular/Lymphatic: No pathologic adenopathy within the abdomen and
pelvis. Vascular structures are age-appropriate.

Reproductive: Unremarkable

Other: None significant

Musculoskeletal: No acute or significant osseous findings.
IMPRESSION: Interval distal progression of the 4 mm obstructing right ureteral
calculus, now within the intramural right ureterovesicular junction.
Progressive right hydronephrosis, now mild to moderate in severity.

## 2020-03-30 MED ORDER — KETOROLAC TROMETHAMINE 60 MG/2ML IM SOLN
30.0000 mg | Freq: Once | INTRAMUSCULAR | Status: AC
  Administered 2020-03-30: 30 mg via INTRAMUSCULAR
  Filled 2020-03-30: qty 2

## 2020-03-30 MED ORDER — SODIUM CHLORIDE 0.9 % IV BOLUS
1000.0000 mL | Freq: Once | INTRAVENOUS | Status: AC
  Administered 2020-03-30: 1000 mL via INTRAVENOUS

## 2020-03-30 MED ORDER — HYDROMORPHONE HCL 1 MG/ML IJ SOLN
0.5000 mg | Freq: Once | INTRAMUSCULAR | Status: AC
  Administered 2020-03-30: 0.5 mg via INTRAVENOUS
  Filled 2020-03-30: qty 1

## 2020-03-30 MED ORDER — HYDROMORPHONE HCL 1 MG/ML IJ SOLN
1.0000 mg | Freq: Once | INTRAMUSCULAR | Status: AC
  Administered 2020-03-30: 1 mg via INTRAVENOUS
  Filled 2020-03-30: qty 1

## 2020-03-30 MED ORDER — KETOROLAC TROMETHAMINE 30 MG/ML IJ SOLN
30.0000 mg | Freq: Once | INTRAMUSCULAR | Status: AC
  Administered 2020-03-30: 30 mg via INTRAVENOUS
  Filled 2020-03-30: qty 1

## 2020-03-30 MED ORDER — ONDANSETRON HCL 4 MG/2ML IJ SOLN
4.0000 mg | Freq: Once | INTRAMUSCULAR | Status: AC
  Administered 2020-03-30: 4 mg via INTRAVENOUS
  Filled 2020-03-30: qty 2

## 2020-03-30 MED ORDER — TAMSULOSIN HCL 0.4 MG PO CAPS
0.4000 mg | ORAL_CAPSULE | Freq: Once | ORAL | Status: AC
  Administered 2020-03-30: 0.4 mg via ORAL
  Filled 2020-03-30: qty 1

## 2020-03-30 NOTE — ED Notes (Signed)
Patient transported to [facility] 

## 2020-03-30 NOTE — ED Notes (Signed)
Patient transported to CT 

## 2020-03-30 NOTE — Telephone Encounter (Signed)
Called [facility] and they stated they can't get patient in sooner. Awaiting for patient to let me know if she wants to be seen here or if she want to try Urology in [place].

## 2020-03-30 NOTE — ED Notes (Signed)
ED Provider at bedside. 

## 2020-03-30 NOTE — ED Provider Notes (Signed)
Emergency Department Provider Note  I have reviewed the triage vital signs and the nursing notes.  HISTORY  Chief Complaint Flank Pain and Nausea   HPI Mary Escobar is a 54 y.o. female who was recently melena ER multiple times for 2 mm right-sided kidney stone is back with pain secondary the same.  Patient states that he took a Percocet last night but then the middle night she woke up with pain and did not think to take it and just came here instead.  Had some chills with a and some nausea with it.  She states that she just wants it taken out. no other new symptoms.   No other associated or modifying symptoms.    Past Medical History:  Diagnosis Date  . Family history of premature CAD   . Fatigue 2020   multifactoral  . Hypothyroid   . IBS (irritable bowel syndrome)   . mild nonobstructive CAD on cath 2009   . Palpitation   . Smoker   . Vitamin D deficiency     Patient Active Problem List   Diagnosis Date Noted  . Hot flashes 02/11/2020  . Encounter for hepatitis C screening test for low risk patient 01/11/2020  . DOE (dyspnea on exertion) 01/11/2020  . Vaccine counseling 01/11/2020  . Depression, major, single episode, moderate ([place]) 01/11/2020  . Chronic constipation 01/11/2020  . Menopausal symptom 01/11/2020  . COPD (chronic obstructive pulmonary disease) ([place]) 12/08/2019  . CAD (coronary artery disease), native coronary artery 12/08/2019  . Irritable bowel syndrome 07/16/2019  . Chronic bilateral back pain 12/08/2018  . Polyarthralgia 12/08/2018  . Vitamin D deficiency 12/08/2018  . Fatigue 09/08/2018  . Snoring 09/08/2018  . Family history of premature CAD 09/08/2018  . Chest tightness 11/21/2015  . Nicotine dependence 10/24/2011  . Allergic rhinitis 10/29/2007  . History of palpitations 04/08/2007  . Hypothyroidism 07/01/2006  . Anxiety 07/01/2006    Past Surgical History:  Procedure Laterality Date  . CORONARY ANGIOGRAM  2009   mild CAD noted  after abn GXT  . WRIST SURGERY      Current Outpatient Rx  . Order #: [id number] Class: Normal  . Order #: [id number] Class: Normal  . Order #: [id number] Class: Historical Med  . Order #: [id number] Class: Normal  . Order #: [id number] Class: Historical Med  . Order #: [id number] Class: Normal  . Order #: [id number] Class: Normal  . Order #: [id number] Class: Normal  . Order #: [id number] Class: Normal  . Order #: [id number] Class: Normal  . Order #: [id number] Class: Normal  . Order #: [id number] Class: Normal  . Order #: [id number] Class: Normal  . Order #: [id number] Class: Normal    Allergies Citalopram, Ciprofloxacin, Sulfonamide derivatives, and Sulfa antibiotics  Family History  Problem Relation Age of Onset  . CAD Father        MI at age 58  . Heart disease Father        CABG x 3  . Sleep apnea Father   . Arrhythmia Father        pacemaker  . COPD Father   . Diabetes Father   . Thyroid disease Father   . Hyperlipidemia Father   . Diabetes Mother   . Other Mother        carcinoid tumor  . Hyperlipidemia Mother   . Crohn's disease Sister   . Cancer Maternal Aunt        colon    Social History Social History   Tobacco Use  . Smoking status: Current Every Day  Smoker    Packs/day: 0.50    Types: Cigarettes  . Smokeless tobacco: Never Used  Vaping Use  . Vaping Use: Never used  Substance Use Topics  . Alcohol use: No    Alcohol/week: 0.0 standard drinks  . Drug use: No    Review of Systems  All other systems negative except as documented in the HPI. All pertinent positives and negatives as reviewed in the HPI. ____________________________________________  PHYSICAL EXAM:  VITAL SIGNS: ED Triage Vitals [03/30/20 0558]  Enc Vitals Group     BP (!) 145/84     Pulse Rate 80     Resp 20     Temp 98.1 F (36.7 C)     Temp Source Oral     SpO2 100 %     Weight 177 lb 14.6 oz (80.7 kg)     Height 5\' 7"  (1.702 m)    Constitutional: Alert and oriented. Well appearing  and in no acute distress. Eyes: Conjunctivae are normal. PERRL. EOMI. Head: Atraumatic. Nose: No congestion/rhinnorhea. Mouth/Throat: Mucous membranes are moist.  Oropharynx non-erythematous. Neck: No stridor.  No meningeal signs.   Cardiovascular: Normal rate, regular rhythm. Good peripheral circulation. Grossly normal heart sounds.   Respiratory: Normal respiratory effort.  No retractions. Lungs CTAB. Gastrointestinal: Soft with mild right flank tenderness. No distention.  Musculoskeletal: No lower extremity tenderness nor edema. No gross deformities of extremities. Neurologic:  Normal speech and language. No gross focal neurologic deficits are appreciated.  Skin:  Skin is warm, dry and intact. No rash noted.  ____________________________________________   LABS (all labs ordered are listed, but only abnormal results are displayed)  Labs Reviewed  CBC WITH DIFFERENTIAL/PLATELET  COMPREHENSIVE METABOLIC PANEL  URINALYSIS, ROUTINE W REFLEX MICROSCOPIC   ____________________________________________  RADIOLOGY  No results found. ____________________________________________  PROCEDURES  Procedure(s) performed:   Procedures ____________________________________________  INITIAL IMPRESSION / ASSESSMENT AND PLAN / ED COURSE  This patient presents to the ED for concern of recurrent symptoms with a kidney stone, this involves an extensive number of treatment options, and is a complaint that carries with it a high risk of complications and morbidity.  The differential diagnosis includes pyelonephritis, UTI, worsening hydronephrosis and kidney dysfunction.   Labs and imaging pending at time of care transfer.  ____________________________________________  FINAL CLINICAL IMPRESSION(S) / ED DIAGNOSES  Final diagnoses:  None    MEDICATIONS GIVEN DURING THIS VISIT:  Medications  ketorolac (TORADOL) 30 MG/ML injection 30 mg (has no administration in time range)  tamsulosin  (FLOMAX) capsule 0.4 mg (has no administration in time range)  HYDROmorphone (DILAUDID) injection 0.5 mg (has no administration in time range)    NEW OUTPATIENT MEDICATIONS STARTED DURING THIS VISIT:  New Prescriptions   No medications on file    Note:  This note was prepared with assistance of Dragon voice recognition software. Occasional wrong-word or sound-a-like substitutions may have occurred due to the inherent limitations of voice recognition software.   Gracilyn Gunia, Corene Cornea, MD 03/31/20 [phone]

## 2020-03-30 NOTE — ED Provider Notes (Signed)
  Physical Exam  BP 125/79 (BP Location: Right Arm)   Pulse 61   Temp 98.2 F (36.8 C) (Oral)   Resp 16   Ht 5\' 7"  (1.702 m)   Wt 80.7 kg   LMP  (LMP Unknown)   SpO2 96%   BMI 27.86 kg/m   Physical Exam  ED Course/Procedures     Procedures  MDM   Received care of patient from Dr. Dayna Barker at 7 AM-please see his note for prior history, physical and care. Briefly this is a 54 year old female presents with continuing pain related to right-sided ureteral lithiasis.  This is her fourth visit to the emergency department for pain. Has not been regularly taking her pain medications.  [place] ordered by Dr. Dayna Barker shows 34mm stone.  Discussed with Dr. Milford Cage.  Obtained repeat CT stone study which shows 39mm UVJ stone. Pain improved with treatment in ED however still present. No signs of infection. Renal function improved from prior.  Recommend NPO at midnight and follow up with Dr. Milford Cage tomorrow.        Gareth Morgan, MD 03/30/20 [phone]

## 2020-03-30 NOTE — ED Triage Notes (Signed)
  Patient comes in with R flank pain that has been going on since 7/1.  Patient has nausea with no emesis. Pain 9/10 and constant.  Patient was diagnosed with kidney stone on 7/1 and told to follow up with urology but earliest appointment was mid August.  Patient taking prescribed pain medications at home but states they do not help for long.

## 2020-03-31 ENCOUNTER — Ambulatory Visit (HOSPITAL_COMMUNITY): Payer: 59

## 2020-03-31 ENCOUNTER — Encounter (HOSPITAL_COMMUNITY)
Admission: RE | Disposition: A | Discharge status: Home / Self Care | Payer: Self-pay | Source: Ambulatory Visit | Attending: Urology

## 2020-03-31 ENCOUNTER — Ambulatory Visit (HOSPITAL_COMMUNITY): Payer: 59 | Admitting: Certified Registered Nurse Anesthetist

## 2020-03-31 ENCOUNTER — Encounter (HOSPITAL_COMMUNITY): Payer: Self-pay | Admitting: Urology

## 2020-03-31 ENCOUNTER — Other Ambulatory Visit: Payer: Self-pay | Admitting: Urology

## 2020-03-31 ENCOUNTER — Observation Stay (HOSPITAL_COMMUNITY)
Admission: RE | Admit: 2020-03-31 | Discharge: 2020-04-01 | Disposition: A | Discharge status: Home / Self Care | Payer: 59 | Source: Ambulatory Visit | Attending: Urology | Admitting: Urology

## 2020-03-31 DIAGNOSIS — U071 COVID-19: Secondary | ICD-10-CM | POA: Diagnosis not present

## 2020-03-31 DIAGNOSIS — E039 Hypothyroidism, unspecified: Secondary | ICD-10-CM | POA: Diagnosis not present

## 2020-03-31 DIAGNOSIS — N201 Calculus of ureter: Principal | ICD-10-CM | POA: Diagnosis present

## 2020-03-31 DIAGNOSIS — N390 Urinary tract infection, site not specified: Secondary | ICD-10-CM | POA: Diagnosis not present

## 2020-03-31 DIAGNOSIS — N12 Tubulo-interstitial nephritis, not specified as acute or chronic: Secondary | ICD-10-CM | POA: Diagnosis not present

## 2020-03-31 DIAGNOSIS — B9689 Other specified bacterial agents as the cause of diseases classified elsewhere: Secondary | ICD-10-CM | POA: Diagnosis not present

## 2020-03-31 DIAGNOSIS — F17210 Nicotine dependence, cigarettes, uncomplicated: Secondary | ICD-10-CM | POA: Diagnosis not present

## 2020-03-31 DIAGNOSIS — N209 Urinary calculus, unspecified: Secondary | ICD-10-CM | POA: Diagnosis present

## 2020-03-31 HISTORY — DX: Personal history of urinary calculi: Z87.442

## 2020-03-31 HISTORY — PX: CYSTOSCOPY W/ URETERAL STENT PLACEMENT: SHX1429

## 2020-03-31 HISTORY — DX: Chronic obstructive pulmonary disease, unspecified: J44.9

## 2020-03-31 LAB — SARS CORONAVIRUS 2 BY RT PCR (HOSPITAL ORDER, PERFORMED IN ~~LOC~~ HOSPITAL LAB): SARS Coronavirus 2: POSITIVE — AB

## 2020-03-31 SURGERY — CYSTOSCOPY, FLEXIBLE, WITH STENT REPLACEMENT
Anesthesia: General | Laterality: Right

## 2020-03-31 MED ORDER — SENNOSIDES-DOCUSATE SODIUM 8.6-50 MG PO TABS
1.0000 | ORAL_TABLET | Freq: Two times a day (BID) | ORAL | Status: DC
  Administered 2020-03-31 – 2020-04-01 (×2): 1 via ORAL
  Filled 2020-03-31 (×2): qty 1

## 2020-03-31 MED ORDER — GENTAMICIN SULFATE 40 MG/ML IJ SOLN
7.0000 mg/kg | INTRAVENOUS | Status: DC
  Filled 2020-03-31: qty 12

## 2020-03-31 MED ORDER — MIDAZOLAM HCL 2 MG/2ML IJ SOLN
INTRAMUSCULAR | Status: AC
  Filled 2020-03-31: qty 2

## 2020-03-31 MED ORDER — FENTANYL CITRATE (PF) 100 MCG/2ML IJ SOLN
INTRAMUSCULAR | Status: DC | PRN
  Administered 2020-03-31: 100 ug via INTRAVENOUS

## 2020-03-31 MED ORDER — GENTAMICIN SULFATE 40 MG/ML IJ SOLN
5.0000 mg/kg | INTRAVENOUS | Status: AC
  Administered 2020-03-31: 350 mg via INTRAVENOUS
  Filled 2020-03-31: qty 8.75

## 2020-03-31 MED ORDER — FLUTICASONE-UMECLIDIN-VILANT 100-62.5-25 MCG/INH IN AEPB: 1.0000 | INHALATION_SPRAY | Freq: Every day | RESPIRATORY_TRACT | Status: DC

## 2020-03-31 MED ORDER — LIDOCAINE 2% (20 MG/ML) 5 ML SYRINGE
INTRAMUSCULAR | Status: AC
  Filled 2020-03-31: qty 5

## 2020-03-31 MED ORDER — OXYCODONE HCL 5 MG PO TABS: 5.0000 mg | ORAL_TABLET | Freq: Once | ORAL | Status: DC | PRN

## 2020-03-31 MED ORDER — CHLORHEXIDINE GLUCONATE 0.12 % MT SOLN
15.0000 mL | Freq: Once | OROMUCOSAL | Status: AC
  Administered 2020-03-31: 15 mL via OROMUCOSAL

## 2020-03-31 MED ORDER — LACTATED RINGERS IV SOLN: INTRAVENOUS | Status: DC

## 2020-03-31 MED ORDER — MIDAZOLAM HCL 5 MG/5ML IJ SOLN
INTRAMUSCULAR | Status: DC | PRN
  Administered 2020-03-31: 2 mg via INTRAVENOUS

## 2020-03-31 MED ORDER — SODIUM CHLORIDE 0.9 % IR SOLN
Status: DC | PRN
  Administered 2020-03-31: 3000 mL

## 2020-03-31 MED ORDER — LEVOTHYROXINE SODIUM 75 MCG PO TABS
75.0000 ug | ORAL_TABLET | Freq: Every day | ORAL | Status: DC
  Administered 2020-04-01: 75 ug via ORAL
  Filled 2020-03-31: qty 3
  Filled 2020-03-31: qty 1

## 2020-03-31 MED ORDER — OXYCODONE HCL 5 MG PO TABS
5.0000 mg | ORAL_TABLET | ORAL | Status: DC | PRN
  Administered 2020-04-01: 5 mg via ORAL
  Filled 2020-03-31: qty 1

## 2020-03-31 MED ORDER — DEXAMETHASONE SODIUM PHOSPHATE 10 MG/ML IJ SOLN
INTRAMUSCULAR | Status: AC
  Filled 2020-03-31: qty 1

## 2020-03-31 MED ORDER — OXYCODONE HCL 5 MG/5ML PO SOLN: 5.0000 mg | Freq: Once | ORAL | Status: DC | PRN

## 2020-03-31 MED ORDER — METOPROLOL SUCCINATE ER 25 MG PO TB24: 25.0000 mg | ORAL_TABLET | Freq: Every day | ORAL | Status: DC

## 2020-03-31 MED ORDER — PROPOFOL 10 MG/ML IV BOLUS
INTRAVENOUS | Status: DC | PRN
  Administered 2020-03-31: 160 mg via INTRAVENOUS

## 2020-03-31 MED ORDER — FLUTICASONE FUROATE-VILANTEROL 100-25 MCG/INH IN AEPB
1.0000 | INHALATION_SPRAY | Freq: Every day | RESPIRATORY_TRACT | Status: DC
  Filled 2020-03-31: qty 28

## 2020-03-31 MED ORDER — BUPROPION HCL ER (XL) 300 MG PO TB24
300.0000 mg | ORAL_TABLET | Freq: Every day | ORAL | Status: DC
  Administered 2020-04-01: 300 mg via ORAL
  Filled 2020-03-31: qty 1

## 2020-03-31 MED ORDER — ACETAMINOPHEN 500 MG PO TABS
1000.0000 mg | ORAL_TABLET | Freq: Three times a day (TID) | ORAL | Status: AC
  Administered 2020-03-31: 1000 mg via ORAL
  Filled 2020-03-31: qty 2

## 2020-03-31 MED ORDER — PROMETHAZINE HCL 25 MG/ML IJ SOLN: 6.2500 mg | INTRAMUSCULAR | Status: DC | PRN

## 2020-03-31 MED ORDER — HYDROMORPHONE HCL 1 MG/ML IJ SOLN: 0.5000 mg | INTRAMUSCULAR | Status: DC | PRN

## 2020-03-31 MED ORDER — LIDOCAINE 2% (20 MG/ML) 5 ML SYRINGE
INTRAMUSCULAR | Status: DC | PRN
  Administered 2020-03-31: 80 mg via INTRAVENOUS

## 2020-03-31 MED ORDER — ONDANSETRON HCL 4 MG/2ML IJ SOLN
INTRAMUSCULAR | Status: DC | PRN
  Administered 2020-03-31: 4 mg via INTRAVENOUS

## 2020-03-31 MED ORDER — DEXAMETHASONE SODIUM PHOSPHATE 10 MG/ML IJ SOLN
INTRAMUSCULAR | Status: DC | PRN
  Administered 2020-03-31: 10 mg via INTRAVENOUS

## 2020-03-31 MED ORDER — SODIUM CHLORIDE 0.9 % IV SOLN: INTRAVENOUS | Status: DC

## 2020-03-31 MED ORDER — IOHEXOL 300 MG/ML  SOLN
INTRAMUSCULAR | Status: DC | PRN
  Administered 2020-03-31: 8 mL

## 2020-03-31 MED ORDER — ONDANSETRON HCL 4 MG/2ML IJ SOLN: 4.0000 mg | INTRAMUSCULAR | Status: DC | PRN

## 2020-03-31 MED ORDER — ROSUVASTATIN CALCIUM 20 MG PO TABS
40.0000 mg | ORAL_TABLET | Freq: Every day | ORAL | Status: DC
  Administered 2020-04-01: 40 mg via ORAL
  Filled 2020-03-31 (×2): qty 2

## 2020-03-31 MED ORDER — ALBUTEROL SULFATE HFA 108 (90 BASE) MCG/ACT IN AERS: 2.0000 | INHALATION_SPRAY | Freq: Four times a day (QID) | RESPIRATORY_TRACT | Status: DC | PRN

## 2020-03-31 MED ORDER — UMECLIDINIUM BROMIDE 62.5 MCG/INH IN AEPB
1.0000 | INHALATION_SPRAY | Freq: Every day | RESPIRATORY_TRACT | Status: DC
  Filled 2020-03-31: qty 7

## 2020-03-31 MED ORDER — ONDANSETRON HCL 4 MG/2ML IJ SOLN
INTRAMUSCULAR | Status: AC
  Filled 2020-03-31: qty 2

## 2020-03-31 MED ORDER — FENTANYL CITRATE (PF) 100 MCG/2ML IJ SOLN
INTRAMUSCULAR | Status: AC
  Filled 2020-03-31: qty 2

## 2020-03-31 MED ORDER — HYDROMORPHONE HCL 1 MG/ML IJ SOLN: 0.2500 mg | INTRAMUSCULAR | Status: DC | PRN

## 2020-03-31 MED ORDER — ASPIRIN 81 MG PO CHEW
81.0000 mg | CHEWABLE_TABLET | Freq: Every day | ORAL | Status: DC
  Administered 2020-04-01: 81 mg via ORAL
  Filled 2020-03-31: qty 1

## 2020-03-31 MED ORDER — PROPOFOL 10 MG/ML IV BOLUS
INTRAVENOUS | Status: AC
  Filled 2020-03-31: qty 20

## 2020-03-31 SURGICAL SUPPLY — 14 items
BAG URO CATCHER STRL LF (MISCELLANEOUS) ×2 IMPLANT
BASKET ZERO TIP NITINOL 2.4FR (BASKET) IMPLANT
CATH INTERMIT  6FR 70CM (CATHETERS) IMPLANT
CLOTH BEACON ORANGE TIMEOUT ST (SAFETY) ×2 IMPLANT
GLOVE BIOGEL M STRL SZ7.5 (GLOVE) ×2 IMPLANT
GOWN STRL REUS W/TWL LRG LVL3 (GOWN DISPOSABLE) ×2 IMPLANT
GUIDEWIRE ANG ZIPWIRE 038X150 (WIRE) ×2 IMPLANT
GUIDEWIRE STR DUAL SENSOR (WIRE) IMPLANT
KIT TURNOVER KIT A (KITS) IMPLANT
MANIFOLD NEPTUNE II (INSTRUMENTS) ×2 IMPLANT
PACK CYSTO (CUSTOM PROCEDURE TRAY) ×2 IMPLANT
STENT POLARIS 5FRX24 (STENTS) ×2 IMPLANT
TUBING CONNECTING 10 (TUBING) ×2 IMPLANT
TUBING UROLOGY SET (TUBING) IMPLANT

## 2020-03-31 NOTE — Progress Notes (Signed)
Pharmacy Antibiotic Note  Mary Escobar is a 54 y.o. female admitted on 03/31/2020 with ureteral stone and bacteruria/early pyelonephritis.  S/P cystoscopy with stent placement on 7/9.  Patient received Gentamicin 350mg  (5mg /kg) pre-op x 1 dose.  Pharmacy has been consulted for Gentamicin dosing.  Plan: Gentamicin 480mg  (7mg /kg) IV q24h Follow LOT Plan to check 10 hr gent level following 7/10 dose if therapy anticipated to continue     Temp (24hrs), Avg:98.3 F (36.8 C), Min:98 F (36.7 C), Max:98.7 F (37.1 C)  Recent Labs  Lab 03/25/20 1223 03/28/20 0602 03/30/20 0638  WBC 12.8* 11.3* 12.6*  CREATININE 1.25* 1.10* 0.92    Estimated Creatinine Clearance: 75.5 mL/min (by C-G formula based on SCr of 0.92 mg/dL).    Allergies  Allergen Reactions  . Citalopram Palpitations  . Ciprofloxacin Nausea And Vomiting  . Sulfonamide Derivatives     REACTION: unknown rx  . Sulfa Antibiotics Rash    Antimicrobials this admission: 7/9 Gent >>    Dose adjustments this admission:    Microbiology results: 7/9 Covid: positive  Thank you for allowing pharmacy to be a part of this patient's care.  Everette Rank, PharmD 03/31/2020 9:43 PM

## 2020-03-31 NOTE — Anesthesia Preprocedure Evaluation (Signed)
Anesthesia Evaluation  Patient identified by MRN, date of birth, ID band Patient awake    Reviewed: Allergy & Precautions, NPO status , Patient's Chart, lab work & pertinent test results  Airway Mallampati: II  TM Distance: >3 FB Neck ROM: Full    Dental no notable dental hx.    Pulmonary neg pulmonary ROS, Current Smoker and Patient abstained from smoking.,    Pulmonary exam normal breath sounds clear to auscultation       Cardiovascular negative cardio ROS Normal cardiovascular exam Rhythm:Regular Rate:Normal     Neuro/Psych Anxiety Depression negative neurological ROS  negative psych ROS   GI/Hepatic negative GI ROS, Neg liver ROS,   Endo/Other  negative endocrine ROS  Renal/GU negative Renal ROS  negative genitourinary   Musculoskeletal negative musculoskeletal ROS (+)   Abdominal   Peds negative pediatric ROS (+)  Hematology negative hematology ROS (+)   Anesthesia Other Findings   Reproductive/Obstetrics negative OB ROS                             Anesthesia Physical Anesthesia Plan  ASA: II  Anesthesia Plan: General   Post-op Pain Management:    Induction: Intravenous  PONV Risk Score and Plan: 2 and Ondansetron, Midazolam and Treatment may vary due to age or medical condition  Airway Management Planned: LMA  Additional Equipment:   Intra-op Plan:   Post-operative Plan: Extubation in OR  Informed Consent: I have reviewed the patients History and Physical, chart, labs and discussed the procedure including the risks, benefits and alternatives for the proposed anesthesia with the patient or authorized representative who has indicated his/her understanding and acceptance.     Dental advisory given  Plan Discussed with: CRNA  Anesthesia Plan Comments:         Anesthesia Quick Evaluation

## 2020-03-31 NOTE — Transfer of Care (Signed)
Immediate Anesthesia Transfer of Care Note  Patient: Mary Escobar  Procedure(s) Performed: CYSTOSCOPY WITH STENT PLACEMENT (Right )  Patient Location: PACU  Anesthesia Type:General  Level of Consciousness: awake, alert  and oriented  Airway & Oxygen Therapy: Patient Spontanous Breathing and Patient connected to nasal cannula oxygen  Post-op Assessment: Report given to RN and Post -op Vital signs reviewed and stable  Post vital signs: Reviewed and stable  Last Vitals:  Vitals Value Taken Time  BP    Temp    Pulse    Resp    SpO2      Last Pain:  Vitals:   03/31/20 1538  TempSrc:   PainSc: 0-No pain         Complications: No complications documented.

## 2020-03-31 NOTE — Brief Op Note (Signed)
03/31/2020  7:12 PM  PATIENT:  Mary Escobar  54 y.o. female  PRE-OPERATIVE DIAGNOSIS:  Right UVJ Stone, Fevers, Bacteruria  POST-OPERATIVE DIAGNOSIS:  Right UVJ Stone  PROCEDURE:  Procedure(s): CYSTOSCOPY WITH STENT PLACEMENT (Right)  SURGEON:  Surgeon(s) and Role:    * Alexis Frock, MD - Primary  PHYSICIAN ASSISTANT:   ASSISTANTS: none   ANESTHESIA:   general  EBL:  minimal   BLOOD ADMINISTERED:none  DRAINS: none   LOCAL MEDICATIONS USED:  NONE  SPECIMEN:  No Specimen  DISPOSITION OF SPECIMEN:  N/A  COUNTS:  YES  TOURNIQUET:  * No tourniquets in log *  DICTATION: .Other Dictation: Dictation Number [id number]  PLAN OF CARE: Admit for overnight observation  PATIENT DISPOSITION:  PACU - hemodynamically stable.   Delay start of Pharmacological VTE agent (>24hrs) due to surgical blood loss or risk of bleeding: yes

## 2020-03-31 NOTE — Anesthesia Postprocedure Evaluation (Signed)
Anesthesia Post Note  Patient: Mary Escobar  Procedure(s) Performed: CYSTOSCOPY WITH STENT PLACEMENT (Right )     Patient location during evaluation: PACU Anesthesia Type: General Level of consciousness: awake and alert Pain management: pain level controlled Vital Signs Assessment: post-procedure vital signs reviewed and stable Respiratory status: spontaneous breathing, nonlabored ventilation and respiratory function stable Cardiovascular status: blood pressure returned to baseline and stable Postop Assessment: no apparent nausea or vomiting Anesthetic complications: no   No complications documented.  Last Vitals:  Vitals:   03/31/20 1915 03/31/20 1930  BP: 114/89 135/79  Pulse: (!) 51 (!) 47  Resp: 20 16  Temp:    SpO2: 100% 100%    Last Pain:  Vitals:   03/31/20 1930  TempSrc:   PainSc: 0-No pain                 Lynda Rainwater

## 2020-03-31 NOTE — Anesthesia Procedure Notes (Signed)
Procedure Name: LMA Insertion Date/Time: 03/31/2020 6:45 PM Performed by: Brian Zeitlin D, CRNA Pre-anesthesia Checklist: Patient identified, Emergency Drugs available, Suction available and Patient being monitored Patient Re-evaluated:Patient Re-evaluated prior to induction Oxygen Delivery Method: Circle system utilized Preoxygenation: Pre-oxygenation with 100% oxygen Induction Type: IV induction Ventilation: Mask ventilation without difficulty LMA: LMA inserted LMA Size: 4.0 Tube type: Oral Number of attempts: 1 Placement Confirmation: positive ETCO2 and breath sounds checked- equal and bilateral Tube secured with: Tape Dental Injury: Teeth and Oropharynx as per pre-operative assessment

## 2020-03-31 NOTE — H&P (Signed)
Mary Escobar is an 54 y.o. female.    Chief Complaint: Pre-OP RIGHT Ureteral Stent Placement  HPI:   1 - RIGHT Ureteral Stone - 25mm Rt ureteral stone by CT x xeveral 03/2020. Initialy given trial of medical therapy, but colic refractory and now with fevers. CT 7/8 with provression to Rt UVJ. Also likely punctate RLP stone.   2 - Bacteruria / Early Pyelonephritis - low grade fevers, malaise, bacteruria on UA 7/9. Prior CX 7/1 and 7/3 negative. CT 7/8 w/o renal abscesses. 100.7.   Today "Mary Escobar" is seen to proceed with RIGHT ureteral stentplacement for semi-urgent renal decompression in setting of stone, low grade fever bacteruria. No low BP, tachycardia, high fevers thus far.   Past Medical History:  Diagnosis Date  . Family history of premature CAD   . Fatigue 2020   multifactoral  . Hypothyroid   . IBS (irritable bowel syndrome)   . mild nonobstructive CAD on cath 2009   . Palpitation   . Smoker   . Vitamin D deficiency     Past Surgical History:  Procedure Laterality Date  . CORONARY ANGIOGRAM  2009   mild CAD noted after abn GXT  . WRIST SURGERY      Family History  Problem Relation Age of Onset  . CAD Father        MI at age 39  . Heart disease Father        CABG x 3  . Sleep apnea Father   . Arrhythmia Father        pacemaker  . COPD Father   . Diabetes Father   . Thyroid disease Father   . Hyperlipidemia Father   . Diabetes Mother   . Other Mother        carcinoid tumor  . Hyperlipidemia Mother   . Crohn's disease Sister   . Cancer Maternal Aunt        colon   Social History:  reports that she has been smoking cigarettes. She has been smoking about 0.50 packs per day. She has never used smokeless tobacco. She reports that she does not drink alcohol and does not use drugs.  Allergies:  Allergies  Allergen Reactions  . Citalopram Palpitations  . Ciprofloxacin Nausea And Vomiting  . Sulfonamide Derivatives     REACTION: unknown rx  . Sulfa Antibiotics  Rash    No medications prior to admission.    Results for orders placed or performed during the hospital encounter of 03/30/20 (from the past 48 hour(s))  CBC with Differential     Status: Abnormal   Collection Time: 03/30/20  6:38 AM  Result Value Ref Range   WBC 12.6 (H) 4.0 - 10.5 K/uL   RBC 4.59 3.87 - 5.11 MIL/uL   Hemoglobin 14.6 12.0 - 15.0 g/dL   HCT 44.5 36 - 46 %   MCV 96.9 80.0 - 100.0 fL   MCH 31.8 26.0 - 34.0 pg   MCHC 32.8 30.0 - 36.0 g/dL   RDW 13.2 11.5 - 15.5 %   Platelets 239 150 - 400 K/uL   nRBC 0.0 0.0 - 0.2 %   Neutrophils Relative % 82 %   Neutro Abs 10.3 (H) 1.7 - 7.7 K/uL   Lymphocytes Relative 12 %   Lymphs Abs 1.5 0.7 - 4.0 K/uL   Monocytes Relative 5 %   Monocytes Absolute 0.6 0 - 1 K/uL   Eosinophils Relative 1 %   Eosinophils Absolute 0.1 0 - 0 K/uL  Basophils Relative 0 %   Basophils Absolute 0.0 0 - 0 K/uL   Immature Granulocytes 0 %   Abs Immature Granulocytes 0.05 0.00 - 0.07 K/uL    Comment: Performed at [facility], [address] [place], [place] [address]  Comprehensive metabolic panel     Status: Abnormal   Collection Time: 03/30/20  6:38 AM  Result Value Ref Range   Sodium 143 135 - 145 mmol/L   Potassium 3.4 (L) 3.5 - 5.1 mmol/L   Chloride 104 98 - 111 mmol/L   CO2 28 22 - 32 mmol/L   Glucose, Bld 123 (H) 70 - 99 mg/dL    Comment: Glucose reference range applies only to samples taken after fasting for at least 8 hours.   BUN 15 6 - 20 mg/dL   Creatinine, Ser 0.92 0.44 - 1.00 mg/dL   Calcium 9.1 8.9 - 10.3 mg/dL   Total Protein 7.4 6.5 - 8.1 g/dL   Albumin 4.2 3.5 - 5.0 g/dL   AST 17 15 - 41 U/L   ALT 18 0 - 44 U/L   Alkaline Phosphatase 62 38 - 126 U/L   Total Bilirubin 0.6 0.3 - 1.2 mg/dL   GFR calc non Af Amer >60 >60 mL/min   GFR calc Af Amer >60 >60 mL/min   Anion gap 11 5 - 15    Comment: Performed at [facility], [address] [place], [place] [address]  Urinalysis, Routine w reflex  microscopic     Status: Abnormal   Collection Time: 03/30/20  6:38 AM  Result Value Ref Range   Color, Urine YELLOW YELLOW   APPearance CLEAR CLEAR   Specific Gravity, Urine <1.005 (L) 1.005 - 1.030   pH 5.5 5.0 - 8.0   Glucose, UA NEGATIVE NEGATIVE mg/dL   Hgb urine dipstick MODERATE (A) NEGATIVE   Bilirubin Urine NEGATIVE NEGATIVE   Ketones, ur NEGATIVE NEGATIVE mg/dL   Protein, ur NEGATIVE NEGATIVE mg/dL   Nitrite NEGATIVE NEGATIVE   Leukocytes,Ua NEGATIVE NEGATIVE    Comment: Performed at [facility], [address] [place], [place] [address]  Urinalysis, Microscopic (reflex)     Status: Abnormal   Collection Time: 03/30/20  6:38 AM  Result Value Ref Range   RBC / HPF 0-5 0 - 5 RBC/hpf   WBC, UA 0-5 0 - 5 WBC/hpf   Bacteria, UA FEW (A) NONE SEEN   Squamous Epithelial / LPF 0-5 0 - 5    Comment: Performed at [facility], [address] [place], [place] [address]   US Renal  Result Date: 03/30/2020 CLINICAL DATA:  Hydronephrosis with recently demonstrated distal right ureteral calculus EXAM: RENAL / URINARY TRACT ULTRASOUND COMPLETE COMPARISON:  CT abdomen and pelvis March 23, 2020 FINDINGS: Right Kidney: Renal measurements: 13.1 x 4.8 x 6.4 cm = volume: 211 mL . Echogenicity and renal cortical thickness are within normal limits. No mass or perinephric fluid visualized. There is moderate hydronephrosis on the right. There is ureterectasis to the level of the right ureterovesical junction. At the right ureterovesical junction there is a 6 mm calculus with surrounding edema. No flow is seen in the bladder on the right. Left Kidney: Renal measurements: 11.9 x 4.8 x 4.9 cm = volume: 148 mL. Echogenicity within normal limits. No mass, perinephric fluid, or hydronephrosis visualized. No sonographically demonstrable calculus or ureterectasis. Bladder: Appears normal for degree of bladder distention. Other: None. IMPRESSION: Persistent moderate  hydronephrosis on the right.  There is right ureterectasis with a 6 mm calculus at the right ureterovesical junction. There appears to be localized ureteral edema in this area. Study otherwise unremarkable. These results will be called to the ordering clinician or representative by the Radiologist Assistant, and communication documented in the PACS or [employer]. Electronically Signed   By: Lowella Grip III M.D.   On: 03/30/2020 08:53   CT Renal Stone Study  Result Date: 03/30/2020 CLINICAL DATA:  Right flank pain, nausea, nephrolithiasis EXAM: CT ABDOMEN AND PELVIS WITHOUT CONTRAST TECHNIQUE: Multidetector CT imaging of the abdomen and pelvis was performed following the standard protocol without IV contrast. COMPARISON:  03/23/2020 FINDINGS: Lower chest: Mild bibasilar atelectasis. The visualized heart and pericardium are unremarkable. Small hiatal hernia. Hepatobiliary: Liver and gallbladder are unremarkable. Pancreas: Unremarkable Spleen: Unremarkable Adrenals/Urinary Tract: The adrenal glands are unremarkable. The kidneys are normal in size and position. Right-sided hydronephrosis has progressed and is now mild-to-moderate in severity. The previously noted obstructing 4 mm distal ureteral calculus has progressed distally and is now seen within the intramural ureterovesicular junction protruding into the bladder lumen, best seen on axial image 79/2. Minimal superimposed nonobstructing nephrolithiasis within the lower pole of the right kidney with 1-2 punctate calculi is again noted. No nephro or urolithiasis on the left. No hydronephrosis on the left. The bladder is mildly distended but is otherwise unremarkable. Stomach/Bowel: Stomach is within normal limits. Appendix appears normal. No evidence of bowel wall thickening, distention, or inflammatory changes. Vascular/Lymphatic: No pathologic adenopathy within the abdomen and pelvis. Vascular structures are age-appropriate. Reproductive: Unremarkable Other: None significant  Musculoskeletal: No acute or significant osseous findings. IMPRESSION: Interval distal progression of the 4 mm obstructing right ureteral calculus, now within the intramural right ureterovesicular junction. Progressive right hydronephrosis, now mild to moderate in severity. Electronically Signed   By: Fidela Salisbury MD   On: 03/30/2020 10:24    Review of Systems  Constitutional: Positive for fever.  HENT: Negative.   Eyes: Negative.   Respiratory: Negative.   Cardiovascular: Negative.   Gastrointestinal: Negative.   Endocrine: Negative.   Genitourinary: Positive for flank pain.  Allergic/Immunologic: Negative.   Neurological: Negative.   Hematological: Negative.   Psychiatric/Behavioral: Negative.   All other systems reviewed and are negative.   There were no vitals taken for this visit. Physical Exam Vitals reviewed.  HENT:     Head: Normocephalic.     Nose: Nose normal.     Mouth/Throat:     Mouth: Mucous membranes are moist.  Eyes:     Pupils: Pupils are equal, round, and reactive to light.  Pulmonary:     Effort: Pulmonary effort is normal.  Abdominal:     General: Abdomen is flat.  Genitourinary:    Comments: Mild Rt CVAT at present.  Musculoskeletal:        General: Normal range of motion.     Cervical back: Normal range of motion.  Skin:    General: Skin is warm.  Neurological:     General: No focal deficit present.     Mental Status: She is alert.  Psychiatric:        Mood and Affect: Mood normal.      Assessment/Plan  Proceed as planned with RIGHT urteral stent placement. Goal wtill be ferver fress x 24 hours before DC, then ureteroscoy to stone free in elective setting in few weeks. Risks, beneits, laternatives, expected peri-op course discussed.   Alexis Frock, MD 03/31/2020, 2:19 PM

## 2020-04-01 ENCOUNTER — Encounter (HOSPITAL_COMMUNITY): Payer: Self-pay | Admitting: Urology

## 2020-04-01 DIAGNOSIS — N201 Calculus of ureter: Secondary | ICD-10-CM | POA: Diagnosis not present

## 2020-04-01 MED ORDER — KETOROLAC TROMETHAMINE 10 MG PO TABS: 10.0000 mg | ORAL_TABLET | Freq: Three times a day (TID) | ORAL | 0 refills | Status: DC | PRN

## 2020-04-01 MED ORDER — OXYCODONE-ACETAMINOPHEN 5-325 MG PO TABS: 1.0000 | ORAL_TABLET | Freq: Four times a day (QID) | ORAL | 0 refills | Status: DC | PRN

## 2020-04-01 MED ORDER — CEPHALEXIN 500 MG PO CAPS: 500.0000 mg | ORAL_CAPSULE | Freq: Three times a day (TID) | ORAL | 0 refills | Status: DC

## 2020-04-01 MED ORDER — SENNOSIDES-DOCUSATE SODIUM 8.6-50 MG PO TABS
1.0000 | ORAL_TABLET | Freq: Two times a day (BID) | ORAL | 0 refills | Status: DC
Start: 2020-04-01 — End: 2020-06-09

## 2020-04-01 NOTE — Plan of Care (Signed)
  Problem: Clinical Measurements: Goal: Ability to maintain clinical measurements within normal limits will improve Outcome: Adequate for Discharge   Problem: Education: Goal: Knowledge of General Education information will improve Description: Including pain rating scale, medication(s)/side effects and non-pharmacologic comfort measures Outcome: Adequate for Discharge

## 2020-04-01 NOTE — Op Note (Signed)
Mary Escobar, Mary Escobar MEDICAL RECORD [id number] ACCOUNT [id number] DATE OF BIRTH:1966-01-20 FACILITY: [facility] LOCATION: [id number] PHYSICIAN:Kinslea Frances Tresa Moore, MD  OPERATIVE REPORT  DATE OF PROCEDURE:  03/31/2020  SURGEON:  Alexis Frock, MD  PREOPERATIVE DIAGNOSES:   1.  Right ureteral stone. 2.  Bacteriuria.  PROCEDURE: 1.  Cystoscopy, right retrograde pyelogram, interpretation. 2.  Insertion of right ureteral stent, 5 x 24 Polaris, no tether.  ESTIMATED BLOOD LOSS:  Nil.  MEDICATIONS:  None.  SPECIMENS:  None.  FINDINGS: 1.  Mild right hydronephrosis. 2.  Distal ureteral stone. 3.  Successful placement of right ureteral stent, proximal end in renal pelvis, distal end in urinary bladder.  INDICATIONS:  The patient is a 54 year old lady with recent history of right ureteral stone.  She has been to the ER multiple times with refractory colic.  She then developed a low-grade fever earlier today, was seen in our office, found to have  bacteriuria.  This was concerning for impending obstructed pyelonephritis.  Options were discussed, including recommended path of renal decompression today, followed by intravenous antibiotics and verified fever free for hours before discharge home and  then treatment of her stone in a delayed fashion and she wished to proceed with initial step today with stenting.  Informed consent was obtained and placed in the medical record.  DESCRIPTION OF PROCEDURE:  The patient is being identified, the procedure being cystoscopy and right stent placement was confirmed.  Procedure timeout was performed.  Intravenous antibiotics administered.  General LMA anesthesia introduced with COVID  precautions.  Cystourethroscopy was then performed after prepping and draping with iodine.  Cystoscopy with 21-French rigid cystoscope with offset lens revealed an unremarkable urinary bladder.  No evidence of calculi or diverticula or papillary lesions  noted.  Ureteral orifices  appeared singleton.  The right ureteral orifice was cannulated with a 6-French renal catheter and right retrograde pyelogram was obtained.  Right retrograde pyelogram demonstrated a single right ureter, single system right kidney.  There was a filling defect in distal ureter consistent with known stone.  A 0.03 ZIPwire was advanced to lower pole and set aside, over which a new 5 x 24  Polaris-type stent was placed using cystoscopic and fluoroscopic guidance.  Good proximal and distal planes were noted.  Efflux of urine was seen around and through the distal end of the stent.  Bladder was emptied per cystoscope.  Procedure was then  terminated.  The patient tolerated the procedure well.  No immediate perioperative complications.  The patient was taken to postanesthesia care unit in stable condition with plan for observation overnight, possible discharge tomorrow if she remains  afebrile.  As she remained nontachycardic and afebrile during the procedure, decision was made not to place catheter.  VN/NUANCE  D:03/31/2020 T:04/01/2020 JOB:[id number]

## 2020-04-01 NOTE — Discharge Instructions (Signed)
1 - You may have urinary urgency (bladder spasms) and bloody urine on / off with stent in place. This is normal. ° °2 - Call MD or go to ER for fever >102, severe pain / nausea / vomiting not relieved by medications, or acute change in medical status ° °

## 2020-04-01 NOTE — Discharge Summary (Signed)
Physician Discharge Summary  Patient ID: Mary Escobar MRN: [id number] DOB/AGE: 1966/05/08 54 y.o.  Admit date: 03/31/2020 Discharge date: 04/01/2020  Admission Diagnoses:  Discharge Diagnoses:  Active Problems:   Ureteral stone   Discharged Condition: good  Hospital Course: Pt underwent right ureteral stent placement 03/31/20 for 30mm ureteral stone with hydro, bacteruria, and low grade fevers for renal decompression in effort to prevent obstructed pyelo. She was observed overnight on IV gentamicin. She remained afebrile, pain controlled, no tachydardia or SIRS symptoms, therefore felt adequate for discharge 04/01/20 on continued outpatient ABX course with keflex as no CX data to suggest otherwise at this point. Plan will be outpatient ureteroscopy for stone management in 1-3 weeks.   Consults: None  Significant Diagnostic Studies: labs: none  Treatments: surgery: as per above  Discharge Exam: Blood pressure 138/76, pulse (!) 44, temperature 98.1 F (36.7 C), temperature source Oral, resp. rate 18, SpO2 94 %. General appearance: alert and cooperative Eyes: negative Nose: Nares normal. Septum midline. Mucosa normal. No drainage or sinus tenderness. Throat: lips, mucosa, and tongue normal; teeth and gums normal Neck: supple, symmetrical, trachea midline Back: symmetric, no curvature. ROM normal. No CVA tenderness. Resp: non-labored on room air.  Cardio: Nl rate GI: soft, non-tender; bowel sounds normal; no masses,  no organomegaly Pelvic: external genitalia normal and no foley Pulses: 2+ and symmetric Skin: Skin color, texture, turgor normal. No rashes or lesions Lymph nodes: Cervical, supraclavicular, and axillary nodes normal.  Disposition: HOME     Follow-up Information    Alexis Frock, MD Follow up.   Specialty: Urology Why: Office will call to arrange day surgery for kidney stone removal.  Contact information: [address] [id number] [phone]                Signed: Alexis Frock 04/01/2020, 10:29 AM

## 2020-04-01 NOTE — Plan of Care (Signed)

## 2020-04-03 ENCOUNTER — Other Ambulatory Visit: Payer: Self-pay | Admitting: Urology

## 2020-04-03 ENCOUNTER — Other Ambulatory Visit: Payer: Self-pay

## 2020-04-03 ENCOUNTER — Encounter (HOSPITAL_COMMUNITY): Payer: Self-pay | Admitting: Urology

## 2020-04-03 NOTE — Progress Notes (Signed)
LVMM for Mary Escobar in regards to patient surgery.  Called pt and she was unaware she was having surgery on 7/14/202.  Informed patient that I would call [facility] and notify them that patient unaware she was having surgery and that [facility] needed to call her and let pt know she was havinig surgery scheduled on 04/05/2020.

## 2020-04-04 ENCOUNTER — Encounter (HOSPITAL_COMMUNITY): Payer: Self-pay | Admitting: Physician Assistant

## 2020-04-04 ENCOUNTER — Telehealth: Payer: Self-pay

## 2020-04-04 MED ORDER — GENTAMICIN SULFATE 40 MG/ML IJ SOLN
5.0000 mg/kg | INTRAVENOUS | Status: DC
  Filled 2020-04-04 (×2): qty 8.75

## 2020-04-04 NOTE — Progress Notes (Addendum)
Anesthesia Review:  PCP: Cardiologist : Dr Kirk Ruths  LOV- 02/02/20 with PA  CT cors- 01/31/20  Chest x-ray : EKG :12/13/2019  Echo : Cardiac Cath :  Activity level: can do flight of stairs without difficulty  Sleep Study/ CPAP : Fasting Blood Sugar :      / Checks Blood Sugar -- times a day:   Blood Thinner/ Instructions /Last Dose: ASA / Instructions/ Last Dose :  ASA- 81 mg - last dose - 04/04/20- patient states she has not yet received preop instructons regarding ASA- LVMM for Mary Escobar in regards to this- See Progress Note  In ED- 03/30/20 Cysto on 03/31/20. Discharged on 04/01/20.

## 2020-04-04 NOTE — Progress Notes (Signed)
Spoke with patient and patient stated that yesterday pm [facility] told her to continue her antibiotics and her discharge instructions did not state what to do with her other meds.  Called and spoke with Almyra Free at Dr Stanford Breed office in regards to that discharge instructions are not real clear as to what patient is supposed to be taking or not taking as far as meds.  Almyra Free to call patient and get discharge instrucions clarified as to her Amlodipine and Metoprolol.

## 2020-04-04 NOTE — Progress Notes (Signed)
LVMM for pt to call [facility] in regards to completion of preop instructions for surgeyr on 04/05/20.

## 2020-04-04 NOTE — Progress Notes (Signed)
Reviewed preop instructions for surgery on 04/05/20.  Patient stated she was instructed by cardiology to restart Amlodipine and toprol just as she normally takes in the pm.  Patient states she has not yet received any preprocedure instructions regarding 81 mg of ASA.  Informed patient that I would call [facility] and inform them of this.  LVMM for Noemi Chapel in regards that patient has not yet received preprocedure instructions regarding ASA prior to surgery. Informed pt that either myself or [facility] would be calling her back.  Left contact number of pt for [facility] on voicemail.

## 2020-04-04 NOTE — Telephone Encounter (Signed)
Called patient, advised of message from PharmD. Patient thankful for call back.

## 2020-04-04 NOTE — Telephone Encounter (Signed)
Received call from surgery center that patient is confused on her discharge instructions from the hospital. It is not very clear on what she is suppose to be taking or to continue to take because nothing was checked when she left the hospital. I advised I went through all the notes and did not see that they wanted her to stop the medications (Metoprolol and Amlodipine) patient states she was on these medications to help with her palpitations, not for BP- but she states her BP has been fine even with her not taking them. She states she last had the medication two nights ago, she did not take it last night.  Patient was called and she states she just wants to make sure she would be okay to take it.  Advised I would route to PharmD to make sure she would be okay and no interaction with other meds would be okay.   Patient thankful for call back.

## 2020-04-04 NOTE — Anesthesia Preprocedure Evaluation (Deleted)
Anesthesia Evaluation    Reviewed: Allergy & Precautions, Patient's Chart, lab work & pertinent test results  Airway        Dental   Pulmonary Current Smoker,           Cardiovascular      Neuro/Psych    GI/Hepatic negative GI ROS, Neg liver ROS,   Endo/Other  Hypothyroidism   Renal/GU negative Renal ROS     Musculoskeletal  (+) Arthritis ,   Abdominal   Peds  Hematology negative hematology ROS (+)   Anesthesia Other Findings   Reproductive/Obstetrics                             Anesthesia Physical Anesthesia Plan  ASA: II  Anesthesia Plan: General   Post-op Pain Management:    Induction: Intravenous  PONV Risk Score and Plan: 3 and Treatment may vary due to age or medical condition, Dexamethasone, Ondansetron and Midazolam  Airway Management Planned: LMA  Additional Equipment: None  Intra-op Plan:   Post-operative Plan:   Informed Consent:     Dental advisory given  Plan Discussed with:   Anesthesia Plan Comments:         Anesthesia Quick Evaluation

## 2020-04-04 NOTE — Telephone Encounter (Signed)
Please have her re-start both medications. She is okay having missed a couple of doses, but she's right, they're not for BP, but to help with angina and atrial fibrillation

## 2020-04-04 NOTE — Progress Notes (Signed)
Mary Escobar called back to state she had spoken with pt in regards to ASA preop instructons and informed pt she was not to stop her Aspirin per Dr Tresa Moore

## 2020-04-05 ENCOUNTER — Encounter (HOSPITAL_COMMUNITY)
Admission: RE | Disposition: A | Discharge status: Home / Self Care | Payer: Self-pay | Source: Home / Self Care | Attending: Urology

## 2020-04-05 ENCOUNTER — Ambulatory Visit (HOSPITAL_COMMUNITY)
Admission: RE | Admit: 2020-04-05 | Discharge: 2020-04-05 | Disposition: A | Discharge status: Home / Self Care | Payer: 59 | Attending: Urology | Admitting: Urology

## 2020-04-05 DIAGNOSIS — Z8249 Family history of ischemic heart disease and other diseases of the circulatory system: Secondary | ICD-10-CM | POA: Diagnosis not present

## 2020-04-05 DIAGNOSIS — K589 Irritable bowel syndrome without diarrhea: Secondary | ICD-10-CM | POA: Insufficient documentation

## 2020-04-05 DIAGNOSIS — M1990 Unspecified osteoarthritis, unspecified site: Secondary | ICD-10-CM | POA: Diagnosis not present

## 2020-04-05 DIAGNOSIS — Z87442 Personal history of urinary calculi: Secondary | ICD-10-CM | POA: Diagnosis not present

## 2020-04-05 DIAGNOSIS — J449 Chronic obstructive pulmonary disease, unspecified: Secondary | ICD-10-CM | POA: Diagnosis not present

## 2020-04-05 DIAGNOSIS — I2510 Atherosclerotic heart disease of native coronary artery without angina pectoris: Secondary | ICD-10-CM | POA: Diagnosis not present

## 2020-04-05 DIAGNOSIS — N12 Tubulo-interstitial nephritis, not specified as acute or chronic: Secondary | ICD-10-CM | POA: Diagnosis not present

## 2020-04-05 DIAGNOSIS — F17210 Nicotine dependence, cigarettes, uncomplicated: Secondary | ICD-10-CM | POA: Insufficient documentation

## 2020-04-05 DIAGNOSIS — E039 Hypothyroidism, unspecified: Secondary | ICD-10-CM | POA: Diagnosis not present

## 2020-04-05 DIAGNOSIS — N201 Calculus of ureter: Secondary | ICD-10-CM | POA: Diagnosis not present

## 2020-04-05 DIAGNOSIS — E559 Vitamin D deficiency, unspecified: Secondary | ICD-10-CM | POA: Diagnosis not present

## 2020-04-05 DIAGNOSIS — Z539 Procedure and treatment not carried out, unspecified reason: Secondary | ICD-10-CM | POA: Diagnosis not present

## 2020-04-05 DIAGNOSIS — U071 COVID-19: Secondary | ICD-10-CM | POA: Diagnosis not present

## 2020-04-05 HISTORY — DX: Unspecified osteoarthritis, unspecified site: M19.90

## 2020-04-05 HISTORY — DX: Anxiety disorder, unspecified: F41.9

## 2020-04-05 LAB — BASIC METABOLIC PANEL
Anion gap: 10 (ref 5–15)
BUN: 15 mg/dL (ref 6–20)
CO2: 26 mmol/L (ref 22–32)
Calcium: 9.5 mg/dL (ref 8.9–10.3)
Chloride: 105 mmol/L (ref 98–111)
Creatinine, Ser: 0.93 mg/dL (ref 0.44–1.00)
GFR calc Af Amer: >60 mL/min (ref >60)
GFR calc non Af Amer: >60 mL/min (ref >60)
Glucose, Bld: 96 mg/dL (ref 70–99)
Potassium: 4.4 mmol/L (ref 3.5–5.1)
Sodium: 141 mmol/L (ref 135–145)

## 2020-04-05 LAB — CBC
HCT: 45.3 % (ref 36.0–46.0)
Hemoglobin: 14.7 g/dL (ref 12.0–15.0)
MCH: 31.6 pg (ref 26.0–34.0)
MCHC: 32.5 g/dL (ref 30.0–36.0)
MCV: 97.4 fL (ref 80.0–100.0)
Platelets: 286 10*3/uL (ref 150–400)
RBC: 4.65 MIL/uL (ref 3.87–5.11)
RDW: 13.3 % (ref 11.5–15.5)
WBC: 6.7 10*3/uL (ref 4.0–10.5)
nRBC: 0 % (ref 0.0–0.2)

## 2020-04-05 LAB — SARS CORONAVIRUS 2 BY RT PCR (HOSPITAL ORDER, PERFORMED IN ~~LOC~~ HOSPITAL LAB): SARS Coronavirus 2: POSITIVE — AB

## 2020-04-05 SURGERY — CYSTOSCOPY/URETEROSCOPY/HOLMIUM LASER/STENT PLACEMENT
Anesthesia: General | Laterality: Right

## 2020-04-05 MED ORDER — ORAL CARE MOUTH RINSE: 15.0000 mL | Freq: Once | OROMUCOSAL | Status: DC

## 2020-04-05 MED ORDER — LACTATED RINGERS IV SOLN: INTRAVENOUS | Status: DC

## 2020-04-05 MED ORDER — CHLORHEXIDINE GLUCONATE 0.12 % MT SOLN: 15.0000 mL | Freq: Once | OROMUCOSAL | Status: DC

## 2020-04-05 NOTE — Progress Notes (Signed)
Pt positive for covid on re-collect. Per Dr. Tresa Moore if patient is positive on repeat, pt will be cancelled. She was informed that she needs to follow up with the office to get her reschedule date and that she could schedule 10 days after first positive date, which would be 7/19 or later. Pt verbalized understanding.

## 2020-04-05 NOTE — H&P (Signed)
Mary Escobar is an 54 y.o. female.    Chief Complaint: Pre-Op RIGHT Ureteroscopic Stone Manipulation  HPI:   1 - RIGHT Ureteral Stone - 28mm Rt ureteral stone by CT x xeveral 03/2020. Initialy given trial of medical therapy, but colic refractory and now with fevers. CT 7/8 with provression to Rt UVJ. Also likely punctate RLP stone. Stented 04/02/20 as some bacteruria and low grade fevers to prevent progression to major infection.   2 - Bacteruria / Early Pyelonephritis - low grade fevers, malaise, bacteruria on UA 7/9. Prior CX 7/1 and 7/3 negative. CT 7/8 w/o renal abscesses. Given empiric rocephin bridged to augmentin empiric. CX remained negative.   3 - Asymptomatic C19 Carrier  - Pt with positve C19 PCR last week, asymptomatic. NO pulm / GI / taste changes. Had symptomatic C19 months ago but was non-vaccinated. Repeat C19 PCR today XXXX.  Today "Mary Escobar" is seen to proceed with RIGHT ureteroscopic stone manipulation with goal of stone free. She had asymptomatic C19 detected last week. She has h/o symptomatic infection months ago, but is NOT vaccinated. Repeat testing today XXXXX.    Past Medical History:  Diagnosis Date  . Anxiety   . Arthritis   . COPD (chronic obstructive pulmonary disease) ([place])   . Dyspnea   . Family history of premature CAD   . Fatigue 2020   multifactoral  . History of kidney stones   . Hypothyroid   . IBS (irritable bowel syndrome)   . mild nonobstructive CAD on cath 2009   . Palpitation   . Smoker   . Vitamin D deficiency     Past Surgical History:  Procedure Laterality Date  . CORONARY ANGIOGRAM  2009   mild CAD noted after abn GXT  . CYSTOSCOPY W/ URETERAL STENT PLACEMENT Right 03/31/2020   Procedure: CYSTOSCOPY WITH STENT PLACEMENT;  Surgeon: Alexis Frock, MD;  Location: [facility];  Service: Urology;  Laterality: Right;  . WRIST SURGERY      Family History  Problem Relation Age of Onset  . CAD Father        MI at age 21  . Heart disease  Father        CABG x 3  . Sleep apnea Father   . Arrhythmia Father        pacemaker  . COPD Father   . Diabetes Father   . Thyroid disease Father   . Hyperlipidemia Father   . Diabetes Mother   . Other Mother        carcinoid tumor  . Hyperlipidemia Mother   . Crohn's disease Sister   . Cancer Maternal Aunt        colon   Social History:  reports that she has been smoking cigarettes. She has been smoking about 0.50 packs per day. She has never used smokeless tobacco. She reports that she does not drink alcohol and does not use drugs.  Allergies:  Allergies  Allergen Reactions  . Citalopram Palpitations  . Ciprofloxacin Nausea And Vomiting  . Sulfonamide Derivatives     REACTION: unknown rx  . Sulfa Antibiotics Rash    No medications prior to admission.    No results found for this or any previous visit (from the past 48 hour(s)). No results found.  Review of Systems  Constitutional: Negative for chills and fever.  All other systems reviewed and are negative.   Height 5\' 7"  (1.702 m), weight 80.3 kg. Physical Exam Vitals reviewed.  HENT:  Head: Normocephalic.     Nose: Nose normal.  Eyes:     Pupils: Pupils are equal, round, and reactive to light.  Cardiovascular:     Rate and Rhythm: Normal rate.     Pulses: Normal pulses.  Pulmonary:     Effort: Pulmonary effort is normal.  Abdominal:     General: Abdomen is flat.  Genitourinary:    Comments: NO CVAT today.  Musculoskeletal:        General: Normal range of motion.     Cervical back: Normal range of motion.  Skin:    General: Skin is warm.  Neurological:     Mental Status: She is alert.      Assessment/Plan  1 - RIGHT Ureteral Stone -  Proceed as planned with RIGHT ureteroscopic stone manipulation with goal of stone free. Risks, benefits, alternatives, expected peri-op course discussed previously and reiterated today.   2 - Bacteruria / Early Pyelonephritis - no symptoms on UTI at present,  now s/p decompression and empiric ABX. I feel safe to proceed today from UTI perspective.  3 - Asymptomatic C19 Carrier - as long as no active viremia, I feel safe to proceed today, but as this is elective, and we are tyring to accommodate her travel plans, if this is positive, safest to postpone to minimize risk or OR/peri-op staff.    Alexis Frock, MD 04/05/2020, 8:14 AM

## 2020-04-12 ENCOUNTER — Other Ambulatory Visit: Payer: Self-pay | Admitting: Medical

## 2020-04-12 NOTE — Telephone Encounter (Signed)
[facility] is requesting to fill pt burpropion. Please advise kh

## 2020-04-17 ENCOUNTER — Telehealth: Payer: Self-pay | Admitting: Medical

## 2020-04-17 ENCOUNTER — Other Ambulatory Visit: Payer: Self-pay | Admitting: Urology

## 2020-04-17 NOTE — Telephone Encounter (Signed)
I am not sure what she is talking about.  I do see where the kidney is swollen due to the stone which I am sure she is aware of it she has follow-up with urology concerning this.  I do not see any documentation about a lung nodule.  Which x-ray or CT scan is she referring to?

## 2020-04-17 NOTE — Telephone Encounter (Signed)
Pt called and had a ultrasound and a xray done at the hospital and states that no one has called her with the results and she is concerned states she seen that she thinks one of the read that there is spot on her lungs, she was suppose to come in Friday but had to cancel because she is going to get her kidney stent taking out, she would like to if you can read those results for her she can be reached at [phone],

## 2020-04-18 ENCOUNTER — Ambulatory Visit: Payer: 59 | Admitting: Gastroenterology

## 2020-04-18 ENCOUNTER — Encounter (HOSPITAL_COMMUNITY): Payer: Self-pay | Admitting: Urology

## 2020-04-18 ENCOUNTER — Other Ambulatory Visit: Payer: Self-pay

## 2020-04-20 NOTE — Telephone Encounter (Signed)
Spoke to patient and she was unable to find the result she was inquiring about. We reviewed each of the recent images. Stated she will look again when she get home and will send a message to [place].

## 2020-04-21 ENCOUNTER — Ambulatory Visit (HOSPITAL_COMMUNITY): Payer: 59 | Admitting: Certified Registered"

## 2020-04-21 ENCOUNTER — Ambulatory Visit (HOSPITAL_COMMUNITY): Payer: 59

## 2020-04-21 ENCOUNTER — Other Ambulatory Visit: Payer: Self-pay

## 2020-04-21 ENCOUNTER — Ambulatory Visit (HOSPITAL_COMMUNITY)
Admission: RE | Admit: 2020-04-21 | Discharge: 2020-04-21 | Disposition: A | Discharge status: Home / Self Care | Payer: 59 | Source: Other Acute Inpatient Hospital | Attending: Urology | Admitting: Urology

## 2020-04-21 ENCOUNTER — Encounter (HOSPITAL_COMMUNITY): Payer: Self-pay | Admitting: Urology

## 2020-04-21 ENCOUNTER — Encounter (HOSPITAL_COMMUNITY)
Admission: RE | Disposition: A | Discharge status: Home / Self Care | Payer: Self-pay | Source: Other Acute Inpatient Hospital | Attending: Urology

## 2020-04-21 ENCOUNTER — Encounter: Payer: 59 | Admitting: Medical

## 2020-04-21 DIAGNOSIS — Z888 Allergy status to other drugs, medicaments and biological substances status: Secondary | ICD-10-CM | POA: Insufficient documentation

## 2020-04-21 DIAGNOSIS — E039 Hypothyroidism, unspecified: Secondary | ICD-10-CM | POA: Diagnosis not present

## 2020-04-21 DIAGNOSIS — I2510 Atherosclerotic heart disease of native coronary artery without angina pectoris: Secondary | ICD-10-CM | POA: Insufficient documentation

## 2020-04-21 DIAGNOSIS — M1990 Unspecified osteoarthritis, unspecified site: Secondary | ICD-10-CM | POA: Insufficient documentation

## 2020-04-21 DIAGNOSIS — Z825 Family history of asthma and other chronic lower respiratory diseases: Secondary | ICD-10-CM | POA: Diagnosis not present

## 2020-04-21 DIAGNOSIS — F419 Anxiety disorder, unspecified: Secondary | ICD-10-CM | POA: Diagnosis not present

## 2020-04-21 DIAGNOSIS — Z882 Allergy status to sulfonamides status: Secondary | ICD-10-CM | POA: Insufficient documentation

## 2020-04-21 DIAGNOSIS — E559 Vitamin D deficiency, unspecified: Secondary | ICD-10-CM | POA: Insufficient documentation

## 2020-04-21 DIAGNOSIS — Z8349 Family history of other endocrine, nutritional and metabolic diseases: Secondary | ICD-10-CM | POA: Diagnosis not present

## 2020-04-21 DIAGNOSIS — Z8379 Family history of other diseases of the digestive system: Secondary | ICD-10-CM | POA: Insufficient documentation

## 2020-04-21 DIAGNOSIS — Z800 Family history of malignant neoplasm of digestive organs: Secondary | ICD-10-CM | POA: Insufficient documentation

## 2020-04-21 DIAGNOSIS — R0602 Shortness of breath: Secondary | ICD-10-CM | POA: Insufficient documentation

## 2020-04-21 DIAGNOSIS — J449 Chronic obstructive pulmonary disease, unspecified: Secondary | ICD-10-CM | POA: Diagnosis not present

## 2020-04-21 DIAGNOSIS — E785 Hyperlipidemia, unspecified: Secondary | ICD-10-CM | POA: Insufficient documentation

## 2020-04-21 DIAGNOSIS — Z881 Allergy status to other antibiotic agents status: Secondary | ICD-10-CM | POA: Diagnosis not present

## 2020-04-21 DIAGNOSIS — K589 Irritable bowel syndrome without diarrhea: Secondary | ICD-10-CM | POA: Diagnosis not present

## 2020-04-21 DIAGNOSIS — F17210 Nicotine dependence, cigarettes, uncomplicated: Secondary | ICD-10-CM | POA: Diagnosis not present

## 2020-04-21 DIAGNOSIS — N201 Calculus of ureter: Secondary | ICD-10-CM | POA: Diagnosis not present

## 2020-04-21 DIAGNOSIS — Z833 Family history of diabetes mellitus: Secondary | ICD-10-CM | POA: Diagnosis not present

## 2020-04-21 DIAGNOSIS — F329 Major depressive disorder, single episode, unspecified: Secondary | ICD-10-CM | POA: Diagnosis not present

## 2020-04-21 DIAGNOSIS — Z8249 Family history of ischemic heart disease and other diseases of the circulatory system: Secondary | ICD-10-CM | POA: Insufficient documentation

## 2020-04-21 HISTORY — PX: CYSTOSCOPY WITH RETROGRADE PYELOGRAM, URETEROSCOPY AND STENT PLACEMENT: SHX5789

## 2020-04-21 HISTORY — PX: HOLMIUM LASER APPLICATION: SHX5852

## 2020-04-21 LAB — CBC
HCT: 45.1 % (ref 36.0–46.0)
Hemoglobin: 14.9 g/dL (ref 12.0–15.0)
MCH: 32 pg (ref 26.0–34.0)
MCHC: 33 g/dL (ref 30.0–36.0)
MCV: 96.8 fL (ref 80.0–100.0)
Platelets: 281 10*3/uL (ref 150–400)
RBC: 4.66 MIL/uL (ref 3.87–5.11)
RDW: 13.2 % (ref 11.5–15.5)
WBC: 7.2 10*3/uL (ref 4.0–10.5)
nRBC: 0 % (ref 0.0–0.2)

## 2020-04-21 LAB — BASIC METABOLIC PANEL
Anion gap: 10 (ref 5–15)
BUN: 26 mg/dL — ABNORMAL HIGH (ref 6–20)
CO2: 24 mmol/L (ref 22–32)
Calcium: 9.2 mg/dL (ref 8.9–10.3)
Chloride: 106 mmol/L (ref 98–111)
Creatinine, Ser: 1 mg/dL (ref 0.44–1.00)
GFR calc Af Amer: >60 mL/min (ref >60)
GFR calc non Af Amer: >60 mL/min (ref >60)
Glucose, Bld: 111 mg/dL — ABNORMAL HIGH (ref 70–99)
Potassium: 5.8 mmol/L — ABNORMAL HIGH (ref 3.5–5.1)
Sodium: 140 mmol/L (ref 135–145)

## 2020-04-21 SURGERY — CYSTOURETEROSCOPY, WITH RETROGRADE PYELOGRAM AND STENT INSERTION
Anesthesia: General | Site: Ureter | Laterality: Right

## 2020-04-21 MED ORDER — PROPOFOL 10 MG/ML IV BOLUS
INTRAVENOUS | Status: AC
  Filled 2020-04-21: qty 20

## 2020-04-21 MED ORDER — FENTANYL CITRATE (PF) 100 MCG/2ML IJ SOLN
INTRAMUSCULAR | Status: AC
  Filled 2020-04-21: qty 2

## 2020-04-21 MED ORDER — ONDANSETRON HCL 4 MG/2ML IJ SOLN
INTRAMUSCULAR | Status: DC | PRN
  Administered 2020-04-21: 4 mg via INTRAVENOUS

## 2020-04-21 MED ORDER — OXYCODONE HCL 5 MG/5ML PO SOLN: 5.0000 mg | Freq: Once | ORAL | Status: DC | PRN

## 2020-04-21 MED ORDER — PROPOFOL 500 MG/50ML IV EMUL
INTRAVENOUS | Status: AC
  Filled 2020-04-21: qty 50

## 2020-04-21 MED ORDER — ORAL CARE MOUTH RINSE: 15.0000 mL | Freq: Once | OROMUCOSAL | Status: AC

## 2020-04-21 MED ORDER — SODIUM CHLORIDE 0.9 % IV SOLN
2.0000 g | INTRAVENOUS | Status: AC
  Administered 2020-04-21: 2 g via INTRAVENOUS
  Filled 2020-04-21: qty 20

## 2020-04-21 MED ORDER — PROMETHAZINE HCL 25 MG/ML IJ SOLN: 6.2500 mg | INTRAMUSCULAR | Status: DC | PRN

## 2020-04-21 MED ORDER — DEXAMETHASONE SODIUM PHOSPHATE 10 MG/ML IJ SOLN
INTRAMUSCULAR | Status: DC | PRN
  Administered 2020-04-21: 10 mg via INTRAVENOUS

## 2020-04-21 MED ORDER — OXYCODONE-ACETAMINOPHEN 5-325 MG PO TABS: 1.0000 | ORAL_TABLET | Freq: Four times a day (QID) | ORAL | 0 refills | Status: DC | PRN

## 2020-04-21 MED ORDER — IOHEXOL 300 MG/ML  SOLN
INTRAMUSCULAR | Status: DC | PRN
  Administered 2020-04-21: 10 mL

## 2020-04-21 MED ORDER — FENTANYL CITRATE (PF) 100 MCG/2ML IJ SOLN
INTRAMUSCULAR | Status: DC | PRN
  Administered 2020-04-21 (×2): 50 ug via INTRAVENOUS
  Administered 2020-04-21: 25 ug via INTRAVENOUS
  Administered 2020-04-21: 50 ug via INTRAVENOUS
  Administered 2020-04-21: 25 ug via INTRAVENOUS

## 2020-04-21 MED ORDER — HYDROMORPHONE HCL 1 MG/ML IJ SOLN
0.2500 mg | INTRAMUSCULAR | Status: DC | PRN
  Administered 2020-04-21: 0.5 mg via INTRAVENOUS

## 2020-04-21 MED ORDER — MIDAZOLAM HCL 2 MG/2ML IJ SOLN
INTRAMUSCULAR | Status: AC
  Filled 2020-04-21: qty 2

## 2020-04-21 MED ORDER — LACTATED RINGERS IV SOLN: INTRAVENOUS | Status: DC

## 2020-04-21 MED ORDER — AMISULPRIDE (ANTIEMETIC) 5 MG/2ML IV SOLN
10.0000 mg | Freq: Once | INTRAVENOUS | Status: AC
  Administered 2020-04-21: 10 mg via INTRAVENOUS

## 2020-04-21 MED ORDER — EPHEDRINE SULFATE-NACL 50-0.9 MG/10ML-% IV SOSY
PREFILLED_SYRINGE | INTRAVENOUS | Status: DC | PRN
  Administered 2020-04-21 (×3): 10 mg via INTRAVENOUS

## 2020-04-21 MED ORDER — PHENYLEPHRINE 40 MCG/ML (10ML) SYRINGE FOR IV PUSH (FOR BLOOD PRESSURE SUPPORT)
PREFILLED_SYRINGE | INTRAVENOUS | Status: DC | PRN
  Administered 2020-04-21: 120 ug via INTRAVENOUS
  Administered 2020-04-21: 80 ug via INTRAVENOUS

## 2020-04-21 MED ORDER — HYDROMORPHONE HCL 1 MG/ML IJ SOLN
INTRAMUSCULAR | Status: AC
  Administered 2020-04-21: 0.5 mg via INTRAVENOUS
  Filled 2020-04-21: qty 1

## 2020-04-21 MED ORDER — KETOROLAC TROMETHAMINE 10 MG PO TABS: 10.0000 mg | ORAL_TABLET | Freq: Three times a day (TID) | ORAL | 0 refills | Status: DC | PRN

## 2020-04-21 MED ORDER — AMISULPRIDE (ANTIEMETIC) 5 MG/2ML IV SOLN
INTRAVENOUS | Status: AC
  Filled 2020-04-21: qty 4

## 2020-04-21 MED ORDER — SODIUM CHLORIDE 0.9 % IR SOLN
Status: DC | PRN
  Administered 2020-04-21: 3000 mL via INTRAVESICAL

## 2020-04-21 MED ORDER — ACETAMINOPHEN 10 MG/ML IV SOLN
INTRAVENOUS | Status: AC
  Administered 2020-04-21: 1000 mg via INTRAVENOUS
  Filled 2020-04-21: qty 100

## 2020-04-21 MED ORDER — ACETAMINOPHEN 10 MG/ML IV SOLN: 1000.0000 mg | Freq: Once | INTRAVENOUS | Status: DC | PRN

## 2020-04-21 MED ORDER — PROPOFOL 10 MG/ML IV BOLUS
INTRAVENOUS | Status: DC | PRN
  Administered 2020-04-21: 200 mg via INTRAVENOUS

## 2020-04-21 MED ORDER — OXYCODONE HCL 5 MG PO TABS: 5.0000 mg | ORAL_TABLET | Freq: Once | ORAL | Status: DC | PRN

## 2020-04-21 MED ORDER — MIDAZOLAM HCL 5 MG/5ML IJ SOLN
INTRAMUSCULAR | Status: DC | PRN
  Administered 2020-04-21: 2 mg via INTRAVENOUS

## 2020-04-21 MED ORDER — LIDOCAINE 2% (20 MG/ML) 5 ML SYRINGE
INTRAMUSCULAR | Status: DC | PRN
  Administered 2020-04-21: 80 mg via INTRAVENOUS

## 2020-04-21 MED ORDER — CHLORHEXIDINE GLUCONATE 0.12 % MT SOLN
15.0000 mL | Freq: Once | OROMUCOSAL | Status: AC
  Administered 2020-04-21: 15 mL via OROMUCOSAL

## 2020-04-21 SURGICAL SUPPLY — 23 items
BAG URO CATCHER STRL LF (MISCELLANEOUS) ×2 IMPLANT
BASKET LASER NITINOL 1.9FR (BASKET) IMPLANT
BSKT STON RTRVL 120 1.9FR (BASKET)
CATH INTERMIT  6FR 70CM (CATHETERS) ×2 IMPLANT
CLOTH BEACON ORANGE TIMEOUT ST (SAFETY) ×2 IMPLANT
EXTRACTOR STONE 1.7FRX115CM (UROLOGICAL SUPPLIES) IMPLANT
GLOVE BIOGEL M STRL SZ7.5 (GLOVE) ×2 IMPLANT
GOWN STRL REUS W/TWL LRG LVL3 (GOWN DISPOSABLE) ×2 IMPLANT
GUIDEWIRE ANG ZIPWIRE 038X150 (WIRE) ×2 IMPLANT
GUIDEWIRE STR DUAL SENSOR (WIRE) ×2 IMPLANT
KIT TURNOVER KIT A (KITS) IMPLANT
LASER FIB FLEXIVA PULSE ID 365 (Laser) IMPLANT
LASER FIB FLEXIVA PULSE ID 550 (Laser) IMPLANT
LASER FIB FLEXIVA PULSE ID 910 (Laser) IMPLANT
MANIFOLD NEPTUNE II (INSTRUMENTS) ×2 IMPLANT
PACK CYSTO (CUSTOM PROCEDURE TRAY) ×2 IMPLANT
SHEATH URETERAL 12FRX28CM (UROLOGICAL SUPPLIES) IMPLANT
SHEATH URETERAL 12FRX35CM (MISCELLANEOUS) ×2 IMPLANT
TRACTIP FLEXIVA PULS ID 200XHI (Laser) IMPLANT
TRACTIP FLEXIVA PULSE ID 200 (Laser)
TUBE FEEDING 8FR 16IN STR KANG (MISCELLANEOUS) ×2 IMPLANT
TUBING CONNECTING 10 (TUBING) ×2 IMPLANT
TUBING UROLOGY SET (TUBING) ×2 IMPLANT

## 2020-04-21 NOTE — Transfer of Care (Signed)
Immediate Anesthesia Transfer of Care Note  Patient: Mary Escobar  Procedure(s) Performed: CYSTOSCOPY WITH RETROGRADE PYELOGRAM, RIGHT URETEROSCOPY (Right Ureter) HOLMIUM LASER APPLICATION (Right Ureter)  Patient Location: PACU  Anesthesia Type:General  Level of Consciousness: awake, oriented, patient cooperative and responds to stimulation  Airway & Oxygen Therapy: Patient Spontanous Breathing and Patient connected to face mask oxygen  Post-op Assessment: Report given to RN and Post -op Vital signs reviewed and stable  Post vital signs: Reviewed and stable  Last Vitals:  Vitals Value Taken Time  BP 143/79 04/21/20 1447  Temp    Pulse 78 04/21/20 1450  Resp 14 04/21/20 1450  SpO2 97 % 04/21/20 1450  Vitals shown include unvalidated device data.  Last Pain:  Vitals:   04/21/20 1226  TempSrc:   PainSc: 0-No pain         Complications: No complications documented.

## 2020-04-21 NOTE — Discharge Instructions (Signed)
1 - You may have urinary urgency (bladder spasms) and bloody urine on / off for up to 2 weeks.  This is normal.  2 - Call MD or go to ER for fever >102, severe pain / nausea / vomiting not relieved by medications, or acute change in medical status  

## 2020-04-21 NOTE — Addendum Note (Signed)
Addendum  created 04/21/20 1714 by Effie Berkshire, MD   Order list changed

## 2020-04-21 NOTE — H&P (Signed)
Mary Escobar is an 54 y.o. female.    Chief Complaint: Pre-Op RIGHT Ureteroscopic Stone Manipulation  HPI:    1 - RIGHT Ureteral Stone - 35mm Rt ureteral stone by CT x xeveral 03/2020. Initialy given trial of medical therapy, but colic refractory and now with fevers. CT 7/8 with provression to Rt UVJ. Also likely punctate RLP stone. Stented 04/02/20 as some bacteruria and low grade fevers to prevent progression to major infection.   2 - Bacteruria / Early Pyelonephritis - low grade fevers, malaise, bacteruria on UA 7/9. Prior CX 7/1 and 7/3 negative. CT 7/8 w/o renal abscesses. Given empiric rocephin bridged to augmentin empiric. CX remained negative.   3 - Asymptomatic C19 Carrier  - Pt with positve C19 PCR last week, asymptomatic. NO pulm / GI / taste changes. Had symptomatic C19 months ago but was non-vaccinated. Repeat C19 PCR today XXXX.  Today "Mary Escobar" is seen to proceed with RIGHT ureteroscopic stone manipulation with goal of stone free. She had asymptomatic C19 detected last week. She has h/o symptomatic infection months ago, but is NOT vaccinated. Repeat testing today XXXXX.  Past Medical History:  Diagnosis Date  . Anxiety   . Arthritis   . COPD (chronic obstructive pulmonary disease) ([place])   . Dyspnea   . Family history of premature CAD   . Fatigue 2020   multifactoral  . History of kidney stones   . Hypothyroid   . IBS (irritable bowel syndrome)   . mild nonobstructive CAD on cath 2009   . Palpitation   . Smoker   . Vitamin D deficiency     Past Surgical History:  Procedure Laterality Date  . CORONARY ANGIOGRAM  2009   mild CAD noted after abn GXT  . CYSTOSCOPY W/ URETERAL STENT PLACEMENT Right 03/31/2020   Procedure: CYSTOSCOPY WITH STENT PLACEMENT;  Surgeon: Alexis Frock, MD;  Location: [facility];  Service: Urology;  Laterality: Right;  . WRIST SURGERY      Family History  Problem Relation Age of Onset  . CAD Father        MI at age 1  . Heart disease Father         CABG x 3  . Sleep apnea Father   . Arrhythmia Father        pacemaker  . COPD Father   . Diabetes Father   . Thyroid disease Father   . Hyperlipidemia Father   . Diabetes Mother   . Other Mother        carcinoid tumor  . Hyperlipidemia Mother   . Crohn's disease Sister   . Colon cancer Maternal Aunt    Social History:  reports that she has been smoking cigarettes. She has been smoking about 0.50 packs per day. She has never used smokeless tobacco. She reports that she does not drink alcohol and does not use drugs.  Allergies:  Allergies  Allergen Reactions  . Citalopram Palpitations  . Ciprofloxacin Nausea And Vomiting  . Sulfa Antibiotics Rash  . Sulfonamide Derivatives Rash    REACTION: unknown rx    No medications prior to admission.    No results found for this or any previous visit (from the past 48 hour(s)). No results found.  Review of Systems  Constitutional: Negative for chills and fever.  All other systems reviewed and are negative.   There were no vitals taken for this visit. Physical Exam Vitals reviewed.  HENT:     Head: Normocephalic.  Nose: Nose normal.  Eyes:     Pupils: Pupils are equal, round, and reactive to light.  Cardiovascular:     Rate and Rhythm: Normal rate.  Pulmonary:     Effort: Pulmonary effort is normal.  Abdominal:     General: Abdomen is flat.  Genitourinary:    Comments: No CVAT at present.  Musculoskeletal:     Cervical back: Normal range of motion.  Skin:    General: Skin is warm.  Neurological:     General: No focal deficit present.     Mental Status: She is alert.  Psychiatric:        Mood and Affect: Mood normal.      Assessment/Plan  Proceed as planned with RIGHT ureteroscopic stone manipulation with goal of stone free. Risks, benefits, alternatives, expected peri-op course discussed previously and reiterated today.   Alexis Frock, MD 04/21/2020, 10:52 AM

## 2020-04-21 NOTE — Anesthesia Postprocedure Evaluation (Signed)
Anesthesia Post Note  Patient: Mary Escobar  Procedure(s) Performed: CYSTOSCOPY WITH RETROGRADE PYELOGRAM, RIGHT URETEROSCOPY (Right Ureter) HOLMIUM LASER APPLICATION (Right Ureter)     Patient location during evaluation: PACU Anesthesia Type: General Level of consciousness: awake and alert Pain management: pain level controlled Vital Signs Assessment: post-procedure vital signs reviewed and stable Respiratory status: spontaneous breathing, nonlabored ventilation, respiratory function stable and patient connected to nasal cannula oxygen Cardiovascular status: blood pressure returned to baseline and stable Postop Assessment: no apparent nausea or vomiting Anesthetic complications: no   No complications documented.  Last Vitals:  Vitals:   04/21/20 1515 04/21/20 1530  BP: 115/70 115/75  Pulse: 64 57  Resp: 16 12  Temp:    SpO2: 100% 94%    Last Pain:  Vitals:   04/21/20 1530  TempSrc:   PainSc: 4                  Deidrick Rainey S

## 2020-04-21 NOTE — Brief Op Note (Signed)
04/21/2020  2:32 PM  PATIENT:  Mary Escobar  54 y.o. female  PRE-OPERATIVE DIAGNOSIS:  RIGHT URETERAL STONE  POST-OPERATIVE DIAGNOSIS:  RIGHT URETERAL STONE  PROCEDURE:   CYSTOSCOPY, RIGHT RETROGRADE PYELOGRAM, RIGHT DIAGNOSTIC URETEROSCOPY, RIGHT URETERAL STENT REMOVAL  SURGEON:  Surgeon(s) and Role:    * Alexis Frock, MD - Primary  PHYSICIAN ASSISTANT:   ASSISTANTS: none   ANESTHESIA:   general  EBL:  Minimal   BLOOD ADMINISTERED:none  DRAINS: none   LOCAL MEDICATIONS USED:  NONE  SPECIMEN:  No Specimen  DISPOSITION OF SPECIMEN:  N/A  COUNTS:  YES  TOURNIQUET:  * No tourniquets in log *  DICTATION: .Other Dictation: Dictation Number [id number]  PLAN OF CARE: Discharge to home after PACU  PATIENT DISPOSITION:  PACU - hemodynamically stable.   Delay start of Pharmacological VTE agent (>24hrs) due to surgical blood loss or risk of bleeding: yes

## 2020-04-21 NOTE — Anesthesia Preprocedure Evaluation (Addendum)
Anesthesia Evaluation  Patient identified by MRN, date of birth, ID band Patient awake    Reviewed: Allergy & Precautions, NPO status , Patient's Chart, lab work & pertinent test results  Airway Mallampati: III  TM Distance: >3 FB Neck ROM: Full    Dental no notable dental hx.    Pulmonary shortness of breath and with exertion, COPD,  COPD inhaler, Current SmokerPatient did not abstain from smoking.,    Pulmonary exam normal breath sounds clear to auscultation       Cardiovascular + CAD  Normal cardiovascular exam Rhythm:Regular Rate:Normal  ECG: SB, rate 55   Neuro/Psych PSYCHIATRIC DISORDERS Anxiety Depression negative neurological ROS     GI/Hepatic Neg liver ROS, IBS (irritable bowel syndrome)   Endo/Other  Hypothyroidism   Renal/GU negative Renal ROS     Musculoskeletal  (+) Arthritis ,   Abdominal   Peds  Hematology HLD   Anesthesia Other Findings RIGHT URETERAL STONE  Reproductive/Obstetrics                            Anesthesia Physical Anesthesia Plan  ASA: III  Anesthesia Plan: General   Post-op Pain Management:    Induction: Intravenous  PONV Risk Score and Plan: 3 and Ondansetron, Dexamethasone, Midazolam and Treatment may vary due to age or medical condition  Airway Management Planned: LMA  Additional Equipment:   Intra-op Plan:   Post-operative Plan: Extubation in OR  Informed Consent: I have reviewed the patients History and Physical, chart, labs and discussed the procedure including the risks, benefits and alternatives for the proposed anesthesia with the patient or authorized representative who has indicated his/her understanding and acceptance.     Dental advisory given  Plan Discussed with: CRNA  Anesthesia Plan Comments: (Covid in 08/2019. )       Anesthesia Quick Evaluation

## 2020-04-21 NOTE — Anesthesia Procedure Notes (Signed)
Procedure Name: LMA Insertion Date/Time: 04/21/2020 2:01 PM Performed by: Silas [place], CRNA Pre-anesthesia Checklist: Patient identified, Emergency Drugs available, Suction available and Patient being monitored Patient Re-evaluated:Patient Re-evaluated prior to induction Oxygen Delivery Method: Circle system utilized Preoxygenation: Pre-oxygenation with 100% oxygen Induction Type: IV induction LMA: LMA with gastric port inserted LMA Size: 4.0 Tube type: Oral Number of attempts: 1 Placement Confirmation: positive ETCO2 and breath sounds checked- equal and bilateral Tube secured with: Tape Dental Injury: Teeth and Oropharynx as per pre-operative assessment

## 2020-04-22 ENCOUNTER — Encounter (HOSPITAL_COMMUNITY): Payer: Self-pay | Admitting: Urology

## 2020-04-22 NOTE — Op Note (Signed)
Mary Escobar, [facility] MEDICAL RECORD [id number] ACCOUNT [id number] DATE OF BIRTH:03/31/1966 FACILITY: [facility] LOCATION: [facility] PHYSICIAN:Keltin Baird, MD  OPERATIVE REPORT  DATE OF PROCEDURE:  04/21/2020  PREOPERATIVE DIAGNOSIS:  Right distal ureteral stone, history of urinary tract infection.  PROCEDURE: 1.  Cystoscopy, right pyelogram interpretation. 2.  Removal of right ureteral stent. 3.  Right diagnostic ureteroscopy.  ESTIMATED BLOOD LOSS:  Nil.  COMPLICATIONS:  None.  SPECIMENS:  None.  FINDINGS: 1.  Interval passage of right distal ureteral stone. 2.  No evidence of residual right-sided urolithiasis. 3.  Successful removal of right ureteral stent.  INDICATIONS:  The patient is a 54 year old lady with recent history of right distal ureteral stone complicated by acute infection.  She cleared her infectious parameters and the urine and was subsequently scheduled for definitive stone management with  ureteroscopy.  She had a symptomatic acute COVID infection and per protocol had to wait at least 10 days asymptomatic for proceeding with left ureteroscopy.  She presents for this today.  Informed consent was obtained and placed in medical record.  DESCRIPTION OF PROCEDURE:  The patient being identified, the procedure being right ureteroscopic stimulation was confirmed.  Procedure timeout was performed.  Intravenous access administered.  General anesthesia induced.  The patient was placed into a  low lithotomy position, sterile field was created prepped and draped the patient's vagina, introitus and proximal thighs using iodine.  Cystourethroscopy was performed using 21-French rigid cystoscope vessel lens.  Inspection of bladder revealed no  diverticula, calcifications or comparable lesions.  The distal end of right ureteral stent was seen in situ.  It was grasped, brought to the level of the urethral meatus.  A 0.03 ZIPwire was advanced through this lower pole exchanged for  open-ended  catheter and right retrograde pyelogram was obtained.  Right retrograde pyelogram demonstrated a single right ureter with single system right kidney.  No obvious filling defects or narrowing noted.  A ZIPwire was once again advanced as a safety wire.  An 8-French feeding tube was placed in the urinary  bladder for pressure release, and semirigid ureteroscopy performed distal 4/5 of the right ureter alongside a separate sensor working wire.  There was no evidence of intraluminal stones seen whatsoever with inspection x2 ____ distal right ureter.  So  this could represent either interval stone passage versus retrograde positioning of stone.  As such, the semirigid scope was exchanged for a short length ureteral access sheath to the level of the proximal ureter using continuous fluoroscopic guidance  and flexible digital ureteroscopy performed the proximal ureter and systematic inspection of the kidney, including all calices x3.  There is no evidence of intraluminal urolithiasis seen whatsoever within the right kidney.  Access sheath was removed  under continuous vision.  Again, no mucosal abnormalities or urolithiasis noted.  This overall was felt to represent interval passage of right distal stone with stent in place.  I was very favorable.  It was not felt that interval stenting would be  warranted and the procedure was then terminated.  The patient tolerated the procedure well.  No immediate perioperative complications.  The patient taken to the postanesthesia care in stable condition with plan for discharge home.  PN/NUANCE  D:04/21/2020 T:04/22/2020 JOB:[id number]

## 2020-05-13 ENCOUNTER — Other Ambulatory Visit: Payer: Self-pay | Admitting: Medical

## 2020-05-15 ENCOUNTER — Other Ambulatory Visit: Payer: Self-pay | Admitting: Medical

## 2020-05-26 ENCOUNTER — Other Ambulatory Visit: Payer: Self-pay | Admitting: Medical

## 2020-05-26 DIAGNOSIS — R072 Precordial pain: Secondary | ICD-10-CM

## 2020-06-08 ENCOUNTER — Other Ambulatory Visit: Payer: Self-pay

## 2020-06-09 ENCOUNTER — Encounter: Payer: Self-pay | Admitting: Family Medicine

## 2020-06-09 ENCOUNTER — Ambulatory Visit (INDEPENDENT_AMBULATORY_CARE_PROVIDER_SITE_OTHER): Payer: 59 | Admitting: Family Medicine

## 2020-06-09 VITALS — BP 110/71 | HR 51 | Temp 98.1°F | Resp 16 | Ht 67.75 in | Wt 174.8 lb

## 2020-06-09 DIAGNOSIS — E039 Hypothyroidism, unspecified: Secondary | ICD-10-CM | POA: Diagnosis not present

## 2020-06-09 DIAGNOSIS — E559 Vitamin D deficiency, unspecified: Secondary | ICD-10-CM

## 2020-06-09 DIAGNOSIS — R7309 Other abnormal glucose: Secondary | ICD-10-CM

## 2020-06-09 DIAGNOSIS — F17209 Nicotine dependence, unspecified, with unspecified nicotine-induced disorders: Secondary | ICD-10-CM

## 2020-06-09 DIAGNOSIS — I25119 Atherosclerotic heart disease of native coronary artery with unspecified angina pectoris: Secondary | ICD-10-CM

## 2020-06-09 DIAGNOSIS — Z0000 Encounter for general adult medical examination without abnormal findings: Secondary | ICD-10-CM | POA: Diagnosis not present

## 2020-06-09 DIAGNOSIS — F419 Anxiety disorder, unspecified: Secondary | ICD-10-CM | POA: Diagnosis not present

## 2020-06-09 DIAGNOSIS — R002 Palpitations: Secondary | ICD-10-CM

## 2020-06-09 DIAGNOSIS — I10 Essential (primary) hypertension: Secondary | ICD-10-CM

## 2020-06-09 DIAGNOSIS — E785 Hyperlipidemia, unspecified: Secondary | ICD-10-CM

## 2020-06-09 DIAGNOSIS — Z87898 Personal history of other specified conditions: Secondary | ICD-10-CM

## 2020-06-09 DIAGNOSIS — Z23 Encounter for immunization: Secondary | ICD-10-CM | POA: Diagnosis not present

## 2020-06-09 DIAGNOSIS — Z1211 Encounter for screening for malignant neoplasm of colon: Secondary | ICD-10-CM

## 2020-06-09 DIAGNOSIS — Z8249 Family history of ischemic heart disease and other diseases of the circulatory system: Secondary | ICD-10-CM

## 2020-06-09 DIAGNOSIS — R072 Precordial pain: Secondary | ICD-10-CM

## 2020-06-09 DIAGNOSIS — J439 Emphysema, unspecified: Secondary | ICD-10-CM

## 2020-06-09 MED ORDER — METOPROLOL SUCCINATE ER 25 MG PO TB24
12.5000 mg | ORAL_TABLET | Freq: Every day | ORAL | 1 refills | Status: DC
Start: 2020-06-09 — End: 2020-12-21

## 2020-06-09 MED ORDER — BUPROPION HCL ER (XL) 300 MG PO TB24
300.0000 mg | ORAL_TABLET | Freq: Every day | ORAL | 1 refills | Status: DC
Start: 2020-06-09 — End: 2020-06-23

## 2020-06-09 MED ORDER — LEVOTHYROXINE SODIUM 75 MCG PO TABS: 75.0000 ug | ORAL_TABLET | Freq: Every day | ORAL | 3 refills | Status: DC

## 2020-06-09 NOTE — Progress Notes (Signed)
Patient ID: Mary Escobar, female  DOB: 01/19/1966, 54 y.o.   MRN: [id number] Patient Care Team    Relationship Specialty Notifications Start End  Ma Hillock, DO PCP - General Family Medicine  06/09/20   Lelon Perla, MD PCP - Cardiology Cardiology Admissions 12/08/19   Alexis Frock, MD Consulting Physician Urology  06/12/20   [facility]    06/12/20   Kerry Dory, NP Nurse Practitioner Nurse Practitioner  06/12/20    Comment: [facility]    Chief Complaint  Patient presents with  . Establish Care    Previous PCP, Chana Bode  . Annual Exam    Subjective:  Mary Escobar is a 54 y.o.  female present for new patient establishment. All past medical history, surgical history, allergies, family history, immunizations, medications and social history were updated in the electronic medical record today. All recent labs, ED visits and hospitalizations within the last year were reviewed.  Hypertension/HLD-LDL goal less than 70/palpitations/CAD/family history of premature CAD She has a significant medical history of nonobstructive CAD, coronary CTA 5/821 showed mild (25-49%) calcified plaque in the LAD, she was sent for [facility] which showed no hemodynamically significant stenosis.  Her calcium score was 156 placing her in the 96 percentile.  She has had a prior history of cardiac catheterization in 2009 after a abnormal stress treadmill test which revealed nonobstructive CAD-20% ostial LAD stenosis and 30% left circumferential disease. Pt reports compliance with metoprolol 25 mg daily and amlodipine 5 mg daily.. Blood pressures ranges at home within normal limits. Patient denies chest pain, shortness of breath or lower extremity edema. Pt is compliant with daily baby ASA. Pt is  prescribed statin. RF: Hypertension, CAD, family history of premature CAD, smoker, hyperlipidemia  Vitamin D deficiency Patient reports her vitamin D has been rather  deficient.  Recent labs reviewed.  She has finished high-dose vitamin D last week and she has been taking 1000 units of vitamin D OTC daily.  She is due for laboratory check today.  Hypothyroidism, unspecified type Patient reports compliance with levothyroxine 75 mcg daily.  Recent thyroid labs have been reviewed and normal.  Anxiety/hot flashes She reports she was started on Wellbutrin 300 mg daily to help with her hot flashes and anxiety.  Hydroxyzine was also prescribed to help with anxiety and she does not feel that was helpful.  She is still having rather significant hot flashes at this time.  Elevated hemoglobin A1c Mildly elevated glucose and A1c of 5.76 months ago.  Pulmonary emphysema, unspecified emphysema type (HCC)/nicotine dependence She reports history of COPD and use of Trelegy Ellipta.  She states her COPD is well controlled on this medication.  She is prescribed albuterol and rarely uses.  She is still smoking at this time however she has cut back and is using Wellbutrin and her smoking cessation.  Health maintenance:  Colonoscopy: No prior screenings.  Referred to gastroenterology today. Mammogram: completed: 08/23/2019-GYN Cervical cancer screening: last pap: 03/24/2018-gynecology Immunizations: tdap due-updated today, Influenza declined (will receive at work), do not see evidence of a pneumonia vaccine, if records do not indicate it has been given will offer next appointment secondary to smoking history/COPD, COVID series declined Infectious disease screening: Hep C completed DEXA: Completed at gynecology's office per patient.  Will await records Assistive device: None Oxygen use: None Patient has a Dental home. Hospitalizations/ED visits: Reviewed  Depression screen [facility] 2/9 06/09/2020 01/11/2020 12/08/2018 09/08/2018  Decreased Interest 0  0 0 3  Down, Depressed, Hopeless 0 3 3 3   PHQ - 2 Score 0 3 3 6   Altered sleeping 0 3 3 3   Tired, decreased energy 0 3 3 3   Change  in appetite 0 0 0 0  Feeling bad or failure about yourself  0 0 0 0  Trouble concentrating 0 0 0 0  Moving slowly or fidgety/restless 0 0 0 0  Suicidal thoughts 0 0 0 0  PHQ-9 Score 0 9 9 12   Difficult doing work/chores Not difficult at all Not difficult at all - Somewhat difficult   No flowsheet data found.      Fall Risk  01/11/2020 12/08/2018 09/08/2018  Falls in the past year? 0 0 0  Follow up - Falls evaluation completed -   Immunization History  Administered Date(s) Administered  . Influenza Whole 08/12/2006, 08/28/2007  . Influenza,inj,Quad PF,6+ Mos 06/17/2018, 05/26/2019  . Influenza-Unspecified 08/25/2012, 07/15/2013  . Tdap 06/09/2020    No exam data present  Past Medical History:  Diagnosis Date  . Anxiety   . Arthritis   . COPD (chronic obstructive pulmonary disease) ([place])   . DOE (dyspnea on exertion) 01/11/2020  . Dyspnea   . Fainting spell   . Family history of premature CAD   . Fatigue 2020   multifactoral  . GERD (gastroesophageal reflux disease)   . History of kidney stones   . Hyperlipidemia   . Hypertension   . Hypothyroid   . IBS (irritable bowel syndrome)   . mild nonobstructive CAD on cath 2009   . Palpitation   . Polyarthralgia 12/08/2018  . Smoker   . Snoring 09/08/2018  . Vitamin D deficiency    Allergies  Allergen Reactions  . Citalopram Palpitations  . Ciprofloxacin Nausea And Vomiting  . Sulfa Antibiotics Rash  . Sulfonamide Derivatives Rash    REACTION: unknown rx   Past Surgical History:  Procedure Laterality Date  . CORONARY ANGIOGRAM  2009   mild CAD noted after abn GXT  . CYSTOSCOPY W/ URETERAL STENT PLACEMENT Right 03/31/2020   Procedure: CYSTOSCOPY WITH STENT PLACEMENT;  Surgeon: Alexis Frock, MD;  Location: [facility];  Service: Urology;  Laterality: Right;  . CYSTOSCOPY WITH RETROGRADE PYELOGRAM, URETEROSCOPY AND STENT PLACEMENT Right 04/21/2020   Procedure: CYSTOSCOPY WITH RETROGRADE PYELOGRAM, RIGHT URETEROSCOPY;   Surgeon: Alexis Frock, MD;  Location: [facility];  Service: Urology;  Laterality: Right;  1 HR  . HOLMIUM LASER APPLICATION Right 04/13/8287   Procedure: HOLMIUM LASER APPLICATION;  Surgeon: Alexis Frock, MD;  Location: [facility];  Service: Urology;  Laterality: Right;  . WRIST SURGERY     Family History  Problem Relation Age of Onset  . CAD Father        MI at age 59  . Heart disease Father        CABG x 3  . Sleep apnea Father   . Arrhythmia Father        pacemaker  . COPD Father   . Diabetes Father   . Thyroid disease Father   . Hyperlipidemia Father   . Arthritis Father   . Hypertension Father   . Heart attack Father   . Diabetes Mother   . Other Mother        carcinoid tumor  . Hyperlipidemia Mother   . Arthritis Mother   . Hypertension Mother   . Crohn's disease Sister   . Colon cancer Maternal Aunt   . Arthritis Maternal Grandmother   .  Diabetes Maternal Grandmother   . Heart attack Maternal Grandmother   . Heart disease Maternal Grandmother   . Arthritis Maternal Grandfather   . Heart attack Maternal Grandfather 47       died 63 of MI  . Arthritis Paternal Grandmother   . Stroke Paternal Grandmother   . Arthritis Paternal Grandfather   . Heart attack Paternal Grandfather 59       Died of MI  . Healthy Daughter    Social History   Social History Narrative   Marital status/children/pets: Single, 1 child.    education/employment: 12th grade education, employed as a Biomedical engineer.   Safety:      -smoke alarm in the home:Yes     - wears seatbelt: Yes     - Feels safe in their relationships: Yes    Allergies as of 06/09/2020      Reactions   Citalopram Palpitations   Ciprofloxacin Nausea And Vomiting   Sulfa Antibiotics Rash   Sulfonamide Derivatives Rash   REACTION: unknown rx      Medication List       Accurate as of June 09, 2020 11:59 PM. If you have any questions, ask your nurse or doctor.        STOP taking these medications     albuterol 108 (90 Base) MCG/ACT inhaler Commonly known as: VENTOLIN HFA Stopped by: Howard Pouch, DO   ketorolac 10 MG tablet Commonly known as: TORADOL Stopped by: Howard Pouch, DO   ondansetron 4 MG tablet Commonly known as: ZOFRAN Stopped by: Howard Pouch, DO   oxyCODONE-acetaminophen 5-325 MG tablet Commonly known as: PERCOCET/ROXICET Stopped by: Howard Pouch, DO   promethazine 12.5 MG tablet Commonly known as: PHENERGAN Stopped by: Howard Pouch, DO   senna-docusate 8.6-50 MG tablet Commonly known as: Senokot-S Stopped by: Howard Pouch, DO   Vitamin D (Ergocalciferol) 1.25 MG (50000 UNIT) Caps capsule Commonly known as: DRISDOL Stopped by: Howard Pouch, DO   VITAMIN D3 PO Stopped by: Howard Pouch, DO     TAKE these medications   amLODipine 5 MG tablet Commonly known as: NORVASC Take 1 tablet (5 mg total) by mouth daily. What changed: when to take this   aspirin EC 81 MG tablet Take 81 mg by mouth daily. Swallow whole.   buPROPion 300 MG 24 hr tablet Commonly known as: WELLBUTRIN XL Take 1 tablet (300 mg total) by mouth daily. What changed: See the new instructions. Changed by: Howard Pouch, DO   ELDERBERRY PO Take 1 tablet by mouth daily. Vitamin C Zinc   levothyroxine 75 MCG tablet Commonly known as: Synthroid Take 1 tablet (75 mcg total) by mouth daily. Prior scripts- new provider What changed: additional instructions Changed by: Howard Pouch, DO   metoprolol succinate 25 MG 24 hr tablet Commonly known as: TOPROL-XL Take 0.5-1 tablets (12.5-25 mg total) by mouth daily. What changed: See the new instructions. Changed by: Howard Pouch, DO   nitroGLYCERIN 0.4 MG SL tablet Commonly known as: NITROSTAT Place 1 tablet (0.4 mg total) under the tongue every 5 (five) minutes as needed for chest pain.   rosuvastatin 40 MG tablet Commonly known as: CRESTOR Take 1 tablet (40 mg total) by mouth daily.   Trelegy Ellipta 100-62.5-25 MCG/INH  Aepb Generic drug: Fluticasone-Umeclidin-Vilant Inhale 1 puff into the lungs daily.       All past medical history, surgical history, allergies, family history, immunizations andmedications were updated in the EMR today and reviewed under the history and medication  portions of their EMR.      ROS: 14 pt review of systems performed and negative (unless mentioned in an HPI)  Objective: BP 110/71 (BP Location: Left Arm, Patient Position: Sitting, Cuff Size: Normal)   Pulse (!) 51   Temp 98.1 F (36.7 C) (Oral)   Resp 16   Ht 5' 7.75" (1.721 m)   Wt 174 lb 12.8 oz (79.3 kg)   LMP  (LMP Unknown)   SpO2 98%   BMI 26.77 kg/m  Gen: Afebrile. No acute distress. Nontoxic in appearance, well-developed, well-nourished, pleasant female. HENT: AT. [place]. Bilateral TM visualized and normal in appearance, normal external auditory canal. MMM, no oral lesions, adequate dentition. Bilateral nares within normal limits. Throat without erythema, ulcerations or exudates.  No cough on exam, no hoarseness on exam. Eyes:Pupils Equal Round Reactive to light, Extraocular movements intact,  Conjunctiva without redness, discharge or icterus. Neck/lymp/endocrine: Supple, no lymphadenopathy, mild thyromegaly CV: RRR no murmur, no edema, +2/4 P posterior tibialis pulses. No JVD. Chest: CTAB, no wheeze, rhonchi or crackles.  Normal respiratory effort.  Good air movement. Abd: Soft.  Flat. NTND. BS present.  No masses palpated. No hepatosplenomegaly. No rebound tenderness or guarding. Skin: No rashes, purpura or petechiae. Warm and well-perfused. Skin intact. Neuro/Msk:  Normal gait. PERLA. EOMi. Alert. Oriented x3.  Cranial nerves II through XII intact. Muscle strength 5/5 upper/lower extremity. DTRs equal bilaterally. Psych: Normal affect, dress and demeanor. Normal speech. Normal thought content and judgment.   Assessment/plan: Mary Escobar is a 54 y.o. female present for  Est care.  Colon cancer screening -  Ambulatory referral to Gastroenterology  Hypertension/HLD-LDL goal less than 70/palpitations/CAD/family history of premature CAD Blood pressure stable today, heart rate is less than 60.  She is having fatigue. Discussed options with her today which included a trial of decreasing her metoprolol to half a tab daily.  Which she is agreeable to. Continue metoprolol 1/2-1 tab daily.  Patient will try half tab daily if palpitations return she will need to increase back to 1 tab daily.  Hopefully decreasing will help bring her heart rate up to above 60 and provide her with some more energy. Continue amlodipine 5 mg daily prescribed by cardiology. Nitro as needed prescribed by cardiology Continue Crestor 40 mg daily prescribed by cardiology Continue daily baby aspirin  Vitamin D deficiency Finished high-dose vitamin D supplementation.  Currently taking OTC vitamin D 1000 units daily.  Recheck levels today and patient will be guided on vitamin D dosing - Vitamin D (25 hydroxy)  Hypothyroidism, unspecified type Stable.  Reviewed recent labs. Thyroid possibly mildly enlarged on exam.  Patient reports she has had thyroid ultrasounds in the past which were normal. Continue levothyroxine 75 mcg daily  Anxiety/hot flashes/smoking cessation Patient reports she feels the Wellbutrin may be helpful with her smoking cessation but has not as effective on her hot flashes. Continue Wellbutrin 300 mg daily. DC hydroxyzine which she states was not working for her. Patient was encouraged to make follow-up appointment if she wanted to discuss smoking cessation and/or different treatment plan for hot flashes.  Elevated hemoglobin A1c Routine exercise and dietary changes recommended - Hemoglobin A1c  Pulmonary emphysema, unspecified emphysema type (HCC)/nicotine dependence Managed by PCP in the past. Patient reports stability on the Ellipta Continue Ellipta 1 puff daily. Continue albuterol 1-2 puffs daily as  needed  Encounter for preventative exam-new patient Patient was encouraged to exercise greater than 150 minutes a week. Patient was encouraged to choose  a diet filled with fresh fruits and vegetables, and lean meats. AVS provided to patient today for education/recommendation on gender specific health and safety maintenance. Colonoscopy: No prior screenings.  Referred to gastroenterology today. Mammogram: completed: 08/23/2019-GYN Cervical cancer screening: last pap: 03/24/2018-gynecology Immunizations: tdap due-updated today, Influenza declined (will receive at work), do not see evidence of a pneumonia vaccine, if records do not indicate it has been given will offer next appointment secondary to smoking history/COPD, COVID series declined Infectious disease screening: Hep C completed DEXA: Completed at gynecology's office per patient.  Will await records   Return in about 5 months (around 11/21/2020) for [facility] (30 min).  Orders Placed This Encounter  Procedures  . Tdap vaccine greater than or equal to 7yo IM  . Vitamin D (25 hydroxy)  . Comp Met (CMET)  . Hemoglobin A1c  . Ambulatory referral to Gastroenterology   Meds ordered this encounter  Medications  . metoprolol succinate (TOPROL-XL) 25 MG 24 hr tablet    Sig: Take 0.5-1 tablets (12.5-25 mg total) by mouth daily.    Dispense:  90 tablet    Refill:  1  . levothyroxine (SYNTHROID) 75 MCG tablet    Sig: Take 1 tablet (75 mcg total) by mouth daily. Prior scripts- new provider    Dispense:  90 tablet    Refill:  3  . buPROPion (WELLBUTRIN XL) 300 MG 24 hr tablet    Sig: Take 1 tablet (300 mg total) by mouth daily.    Dispense:  90 tablet    Refill:  1  . Fluticasone-Umeclidin-Vilant (TRELEGY ELLIPTA) 100-62.5-25 MCG/INH AEPB    Sig: Inhale 1 puff into the lungs daily.    Dispense:  60 each    Refill:  5    Referral Orders     Ambulatory referral to Gastroenterology  A new patient establishment complete physical/preventative  exam was completed today in addition to > 30 minutes minutes was dedicated to this patient's encounter to include pre-visit review of chart, face-to-face time with patient and post-visit work- which include documentation and prescribing medications and/or ordering test when necessary for establishing patient and covering multiple chronic conditions. Note is dictated utilizing voice recognition software. Although note has been proof read prior to signing, occasional typographical errors still can be missed. If any questions arise, please do not hesitate to call for verification.  Electronically signed by: Howard Pouch, DO [place]

## 2020-06-09 NOTE — Patient Instructions (Addendum)
Nice to meet you today.  We completed your physical today and covered your chronic conditions.  Next appt 5.5 months on chronic conditions.  Follow up sooner if you want to discuss your hot flashes and anxiety and consider change in therapy.     Health Maintenance, Female Adopting a healthy lifestyle and getting preventive care are important in promoting health and wellness. Ask your health care provider about:  The right schedule for you to have regular tests and exams.  Things you can do on your own to prevent diseases and keep yourself healthy. What should I know about diet, weight, and exercise? Eat a healthy diet   Eat a diet that includes plenty of vegetables, fruits, low-fat dairy products, and lean protein.  Do not eat a lot of foods that are high in solid fats, added sugars, or sodium. Maintain a healthy weight Body mass index (BMI) is used to identify weight problems. It estimates body fat based on height and weight. Your health care provider can help determine your BMI and help you achieve or maintain a healthy weight. Get regular exercise Get regular exercise. This is one of the most important things you can do for your health. Most adults should:  Exercise for at least 150 minutes each week. The exercise should increase your heart rate and make you sweat (moderate-intensity exercise).  Do strengthening exercises at least twice a week. This is in addition to the moderate-intensity exercise.  Spend less time sitting. Even light physical activity can be beneficial. Watch cholesterol and blood lipids Have your blood tested for lipids and cholesterol at 55 years of age, then have this test every 5 years. Have your cholesterol levels checked more often if:  Your lipid or cholesterol levels are high.  You are older than 55 years of age.  You are at high risk for heart disease. What should I know about cancer screening? Depending on your health history and family history,  you may need to have cancer screening at various ages. This may include screening for:  Breast cancer.  Cervical cancer.  Colorectal cancer.  Skin cancer.  Lung cancer. What should I know about heart disease, diabetes, and high blood pressure? Blood pressure and heart disease  High blood pressure causes heart disease and increases the risk of stroke. This is more likely to develop in people who have high blood pressure readings, are of African descent, or are overweight.  Have your blood pressure checked: ? Every 3-5 years if you are 55-21 years of age. ? Every year if you are 55 years old or older. Diabetes Have regular diabetes screenings. This checks your fasting blood sugar level. Have the screening done:  Once every three years after age 55 if you are at a normal weight and have a low risk for diabetes.  More often and at a younger age if you are overweight or have a high risk for diabetes. What should I know about preventing infection? Hepatitis B If you have a higher risk for hepatitis B, you should be screened for this virus. Talk with your health care provider to find out if you are at risk for hepatitis B infection. Hepatitis C Testing is recommended for:  Everyone born from [birth year] through 1966.  Anyone with known risk factors for hepatitis C. Sexually transmitted infections (STIs)  Get screened for STIs, including gonorrhea and chlamydia, if: ? You are sexually active and are younger than 55 years of age. ? You are older than 55  years of age and your health care provider tells you that you are at risk for this type of infection. ? Your sexual activity has changed since you were last screened, and you are at increased risk for chlamydia or gonorrhea. Ask your health care provider if you are at risk.  Ask your health care provider about whether you are at high risk for HIV. Your health care provider may recommend a prescription medicine to help prevent HIV infection.  If you choose to take medicine to prevent HIV, you should first get tested for HIV. You should then be tested every 3 months for as long as you are taking the medicine. Pregnancy  If you are about to stop having your period (premenopausal) and you may become pregnant, seek counseling before you get pregnant.  Take 400 to 800 micrograms (mcg) of folic acid every day if you become pregnant.  Ask for birth control (contraception) if you want to prevent pregnancy. Osteoporosis and menopause Osteoporosis is a disease in which the bones lose minerals and strength with aging. This can result in bone fractures. If you are 55 years old or older, or if you are at risk for osteoporosis and fractures, ask your health care provider if you should:  Be screened for bone loss.  Take a calcium or vitamin D supplement to lower your risk of fractures.  Be given hormone replacement therapy (HRT) to treat symptoms of menopause. Follow these instructions at home: Lifestyle  Do not use any products that contain nicotine or tobacco, such as cigarettes, e-cigarettes, and chewing tobacco. If you need help quitting, ask your health care provider.  Do not use street drugs.  Do not share needles.  Ask your health care provider for help if you need support or information about quitting drugs. Alcohol use  Do not drink alcohol if: ? Your health care provider tells you not to drink. ? You are pregnant, may be pregnant, or are planning to become pregnant.  If you drink alcohol: ? Limit how much you use to 0-1 drink a day. ? Limit intake if you are breastfeeding.  Be aware of how much alcohol is in your drink. In the [place] one drink equals one 12 oz bottle of beer (355 mL), one 5 oz glass of wine (148 mL), or one 1 oz glass of hard liquor (44 mL). General instructions  Schedule regular health, dental, and eye exams.  Stay current with your vaccines.  Tell your health care provider if: ? You often feel  depressed. ? You have ever been abused or do not feel safe at home. Summary  Adopting a healthy lifestyle and getting preventive care are important in promoting health and wellness.  Follow your health care provider's instructions about healthy diet, exercising, and getting tested or screened for diseases.  Follow your health care provider's instructions on monitoring your cholesterol and blood pressure. This information is not intended to replace advice given to you by your health care provider. Make sure you discuss any questions you have with your health care provider. Document Revised: 09/02/2018 Document Reviewed: 09/02/2018 Elsevier Patient Education  2020 [employer].

## 2020-06-10 LAB — HEMOGLOBIN A1C
Hgb A1c MFr Bld: 5.7 % of total Hgb — ABNORMAL HIGH (ref <5.7)
Mean Plasma Glucose: 117 (calc)
eAG (mmol/L): 6.5 (calc)

## 2020-06-10 LAB — COMPREHENSIVE METABOLIC PANEL
AG Ratio: 1.7 (calc) (ref 1.0–2.5)
ALT: 16 U/L (ref 6–29)
AST: 17 U/L (ref 10–35)
Albumin: 4.4 g/dL (ref 3.6–5.1)
Alkaline phosphatase (APISO): 68 U/L (ref 37–153)
BUN: 18 mg/dL (ref 7–25)
CO2: 22 mmol/L (ref 20–32)
Calcium: 9.5 mg/dL (ref 8.6–10.4)
Chloride: 108 mmol/L (ref 98–110)
Creat: 0.92 mg/dL (ref 0.50–1.05)
Globulin: 2.6 g/dL (calc) (ref 1.9–3.7)
Glucose, Bld: 77 mg/dL (ref 65–99)
Potassium: 4.2 mmol/L (ref 3.5–5.3)
Sodium: 141 mmol/L (ref 135–146)
Total Bilirubin: 0.4 mg/dL (ref 0.2–1.2)
Total Protein: 7 g/dL (ref 6.1–8.1)

## 2020-06-10 LAB — VITAMIN D 25 HYDROXY (VIT D DEFICIENCY, FRACTURES): Vit D, 25-Hydroxy: 63 ng/mL (ref 30–100)

## 2020-06-12 ENCOUNTER — Encounter: Payer: Self-pay | Admitting: Family Medicine

## 2020-06-12 DIAGNOSIS — I10 Essential (primary) hypertension: Secondary | ICD-10-CM | POA: Insufficient documentation

## 2020-06-12 DIAGNOSIS — E785 Hyperlipidemia, unspecified: Secondary | ICD-10-CM | POA: Insufficient documentation

## 2020-06-12 MED ORDER — TRELEGY ELLIPTA 100-62.5-25 MCG/INH IN AEPB
1.0000 | INHALATION_SPRAY | Freq: Every day | RESPIRATORY_TRACT | 5 refills | Status: DC
Start: 2020-06-12 — End: 2020-12-21

## 2020-06-23 ENCOUNTER — Ambulatory Visit (INDEPENDENT_AMBULATORY_CARE_PROVIDER_SITE_OTHER): Payer: 59 | Admitting: Family Medicine

## 2020-06-23 ENCOUNTER — Encounter: Payer: Self-pay | Admitting: Family Medicine

## 2020-06-23 ENCOUNTER — Other Ambulatory Visit: Payer: Self-pay

## 2020-06-23 VITALS — BP 115/72 | HR 57 | Temp 98.7°F | Ht 67.0 in | Wt 174.0 lb

## 2020-06-23 DIAGNOSIS — F419 Anxiety disorder, unspecified: Secondary | ICD-10-CM | POA: Diagnosis not present

## 2020-06-23 DIAGNOSIS — R232 Flushing: Secondary | ICD-10-CM

## 2020-06-23 DIAGNOSIS — F321 Major depressive disorder, single episode, moderate: Secondary | ICD-10-CM

## 2020-06-23 MED ORDER — VENLAFAXINE HCL ER 75 MG PO CP24: 75.0000 mg | ORAL_CAPSULE | Freq: Every day | ORAL | 0 refills | Status: DC

## 2020-06-23 NOTE — Patient Instructions (Signed)
Replace Wellbutrin with effexor. Take one tab in the morning.  Call in after 4 weeks and let us know if it is working well or need higher dose and we will call in an additional refill at that time for 3 mos.

## 2020-06-23 NOTE — Progress Notes (Signed)
This visit occurred during the SARS-CoV-2 public health emergency.  Safety protocols were in place, including screening questions prior to the visit, additional usage of staff PPE, and extensive cleaning of exam room while observing appropriate contact time as indicated for disinfecting solutions.    Mary Escobar , 1966-09-02, 54 y.o., female MRN: [id number] Patient Care Team    Relationship Specialty Notifications Start End  Ma Hillock, DO PCP - General Family Medicine  06/09/20   Lelon Perla, MD PCP - Cardiology Cardiology Admissions 12/08/19   Alexis Frock, MD Consulting Physician Urology  06/12/20   [facility]    06/12/20   Kerry Dory, NP Nurse Practitioner Nurse Practitioner  06/12/20    Comment: [facility]    Chief Complaint  Patient presents with  . Follow-up    hot flashes      Subjective: Pt presents for an OV with complaints of hot flashes.   Patient reports she has been prescribed Wellbutrin for couple years for her anxiety and smoking cessation.  She states it works well for helping with her smoking cravings but is not helping for her anxiety.  She is having hot flashes and would like to try medication to help with her hot flashes as well.  Depression screen [facility] 2/9 06/23/2020 06/09/2020 01/11/2020 12/08/2018 09/08/2018  Decreased Interest 0 0 0 0 3  Down, Depressed, Hopeless 2 0 3 3 3   PHQ - 2 Score 2 0 3 3 6   Altered sleeping 3 0 3 3 3   Tired, decreased energy 3 0 3 3 3   Change in appetite 1 0 0 0 0  Feeling bad or failure about yourself  0 0 0 0 0  Trouble concentrating 0 0 0 0 0  Moving slowly or fidgety/restless 0 0 0 0 0  Suicidal thoughts 0 0 0 0 0  PHQ-9 Score 9 0 9 9 12   Difficult doing work/chores - Not difficult at all Not difficult at all - Somewhat difficult    Allergies  Allergen Reactions  . Citalopram Palpitations  . Ciprofloxacin Nausea And Vomiting  . Effexor [Venlafaxine]     nausea  .  Sulfonamide Derivatives Rash    REACTION: unknown rx   Social History   Social History Narrative   Marital status/children/pets: Single, 1 child.    education/employment: 12th grade education, employed as a Biomedical engineer.   Safety:      -smoke alarm in the home:Yes     - wears seatbelt: Yes     - Feels safe in their relationships: Yes   Past Medical History:  Diagnosis Date  . Anxiety   . Arthritis   . COPD (chronic obstructive pulmonary disease) ([place])   . DOE (dyspnea on exertion) 01/11/2020  . Dyspnea   . Fainting spell   . Family history of premature CAD   . Fatigue 2020   multifactoral  . GERD (gastroesophageal reflux disease)   . History of kidney stones   . Hyperlipidemia   . Hypertension   . Hypothyroid   . IBS (irritable bowel syndrome)   . mild nonobstructive CAD on cath 2009   . Palpitation   . Polyarthralgia 12/08/2018  . Smoker   . Snoring 09/08/2018  . Vitamin D deficiency    Past Surgical History:  Procedure Laterality Date  . CORONARY ANGIOGRAM  2009   mild CAD noted after abn GXT  . CYSTOSCOPY W/ URETERAL STENT PLACEMENT Right 03/31/2020  Procedure: CYSTOSCOPY WITH STENT PLACEMENT;  Surgeon: Alexis Frock, MD;  Location: [facility];  Service: Urology;  Laterality: Right;  . CYSTOSCOPY WITH RETROGRADE PYELOGRAM, URETEROSCOPY AND STENT PLACEMENT Right 04/21/2020   Procedure: CYSTOSCOPY WITH RETROGRADE PYELOGRAM, RIGHT URETEROSCOPY;  Surgeon: Alexis Frock, MD;  Location: [facility];  Service: Urology;  Laterality: Right;  1 HR  . HOLMIUM LASER APPLICATION Right 2/59/5638   Procedure: HOLMIUM LASER APPLICATION;  Surgeon: Alexis Frock, MD;  Location: [facility];  Service: Urology;  Laterality: Right;  . WRIST SURGERY     Family History  Problem Relation Age of Onset  . CAD Father        MI at age 3  . Heart disease Father        CABG x 3  . Sleep apnea Father   . Arrhythmia Father        pacemaker  . COPD Father   . Diabetes Father   . Thyroid  disease Father   . Hyperlipidemia Father   . Arthritis Father   . Hypertension Father   . Heart attack Father   . Diabetes Mother   . Other Mother        carcinoid tumor  . Hyperlipidemia Mother   . Arthritis Mother   . Hypertension Mother   . Crohn's disease Sister   . Colon cancer Maternal Aunt   . Arthritis Maternal Grandmother   . Diabetes Maternal Grandmother   . Heart attack Maternal Grandmother   . Heart disease Maternal Grandmother   . Arthritis Maternal Grandfather   . Heart attack Maternal Grandfather 42       died 57 of MI  . Arthritis Paternal Grandmother   . Stroke Paternal Grandmother   . Arthritis Paternal Grandfather   . Heart attack Paternal Grandfather 90       Died of MI  . Healthy Daughter    Allergies as of 06/23/2020      Reactions   Citalopram Palpitations   Ciprofloxacin Nausea And Vomiting   Sulfa Antibiotics Rash   Sulfonamide Derivatives Rash   REACTION: unknown rx      Medication List       Accurate as of June 23, 2020 11:59 PM. If you have any questions, ask your nurse or doctor.        STOP taking these medications   buPROPion 300 MG 24 hr tablet Commonly known as: WELLBUTRIN XL Stopped by: Howard Pouch, DO     TAKE these medications   amLODipine 5 MG tablet Commonly known as: NORVASC Take 1 tablet (5 mg total) by mouth daily. What changed: when to take this   aspirin EC 81 MG tablet Take 81 mg by mouth daily. Swallow whole.   D3-1000 PO Take by mouth.   ELDERBERRY PO Take 1 tablet by mouth daily. Vitamin C Zinc   levothyroxine 75 MCG tablet Commonly known as: Synthroid Take 1 tablet (75 mcg total) by mouth daily. Prior scripts- new provider   metoprolol succinate 25 MG 24 hr tablet Commonly known as: TOPROL-XL Take 0.5-1 tablets (12.5-25 mg total) by mouth daily.   nitroGLYCERIN 0.4 MG SL tablet Commonly known as: NITROSTAT Place 1 tablet (0.4 mg total) under the tongue every 5 (five) minutes as needed for  chest pain.   rosuvastatin 40 MG tablet Commonly known as: CRESTOR Take 1 tablet (40 mg total) by mouth daily.   Trelegy Ellipta 100-62.5-25 MCG/INH Aepb Generic drug: Fluticasone-Umeclidin-Vilant Inhale 1 puff into the lungs daily.  venlafaxine XR 75 MG 24 hr capsule Commonly known as: Effexor XR Take 1 capsule (75 mg total) by mouth daily with breakfast. Started by: Howard Pouch, DO       All past medical history, surgical history, allergies, family history, immunizations andmedications were updated in the EMR today and reviewed under the history and medication portions of their EMR.     ROS: Negative, with the exception of above mentioned in HPI   Objective:  BP 115/72   Pulse (!) 57   Temp 98.7 F (37.1 C)   Ht 5\' 7"  (1.702 m)   Wt 174 lb (78.9 kg)   LMP  (LMP Unknown)   SpO2 96%   BMI 27.25 kg/m  Body mass index is 27.25 kg/m. Gen: Afebrile. No acute distress. Nontoxic in appearance, well developed, well nourished.  HENT: AT. [place].  Eyes:Pupils Equal Round Reactive to light, Extraocular movements intact,  Conjunctiva without redness, discharge or icterus. Neck/lymp/endocrine: Supple,no lymphadenopathy CV: RRR  Chest: CTAB, no wheeze or crackles. Good air movement, normal resp effort.  Neuro: Alert. Oriented x3  Psych: Normal affect, dress and demeanor. Normal speech. Normal thought content and judgment.  No exam data present No results found. No results found for this or any previous visit (from the past 24 hour(s)).  Assessment/Plan: RONNETTA CURRINGTON is a 54 y.o. female present for OV for  Hot flashes/Anxiety/Depression, major, single episode, moderate ([place]) Discussed possible switch to a medication that could help with hot flashes and her anxiety.  However, it would not be as effective for her smoking cessation.  She reports she still would like to try a different medication than Wellbutrin if it could help with anxiety and hot flashes. Briefly discussed  staying on Wellbutrin but adding a medication like Lexapro to help with the hot flashes as a potential second option. DC Wellbutrin Replace with Effexor 75 mg daily. Follow-up 3 months, sooner if needed.   Reviewed expectations re: course of current medical issues.  Discussed self-management of symptoms.  Outlined signs and symptoms indicating need for more acute intervention.  Patient verbalized understanding and all questions were answered.  Patient received an After-Visit Summary.    No orders of the defined types were placed in this encounter.  Meds ordered this encounter  Medications  . DISCONTD: venlafaxine XR (EFFEXOR XR) 75 MG 24 hr capsule    Sig: Take 1 capsule (75 mg total) by mouth daily with breakfast.    Dispense:  90 capsule    Refill:  0    Please discontinue Wellbutrin scripts- change in regimen.   Referral Orders  No referral(s) requested today     Note is dictated utilizing voice recognition software. Although note has been proof read prior to signing, occasional typographical errors still can be missed. If any questions arise, please do not hesitate to call for verification.   electronically signed by:  Howard Pouch, DO  [place]

## 2020-06-26 ENCOUNTER — Telehealth: Payer: Self-pay

## 2020-06-26 NOTE — Telephone Encounter (Signed)
Patient was last seen 06/23/20. In note, "Replace Wellbutrin with effexor. Take one tab in the morning. Call in after 4 weeks and let us know if it is working well or need higher dose and we will call in an additional refill at that time for 3 mos."   Please advise, thanks.

## 2020-06-26 NOTE — Telephone Encounter (Signed)
If unable to tolerate Effexor restart Wellbutrin. I had called in Wellbutrin early this month with 90 days and a refill. She should have plenty.  If she would like to try the lexapro low dose add on in addition to her Wellbutrin to help with hot flashes we can try if desired.

## 2020-06-26 NOTE — Telephone Encounter (Signed)
Patient states she took 1 effexor on Saturday 06/24/20 and felt very nauseated. She took a phenergan an had to lay down .

## 2020-06-28 MED ORDER — ESCITALOPRAM OXALATE 5 MG PO TABS: 5.0000 mg | ORAL_TABLET | Freq: Every day | ORAL | 5 refills | Status: DC

## 2020-06-28 NOTE — Telephone Encounter (Signed)
Pt agrees to start Lexapro along with the Wellbutrin.

## 2020-06-28 NOTE — Telephone Encounter (Signed)
Spoke with pt regarding rx and instructions. Pt verbalized understanding.

## 2020-06-28 NOTE — Addendum Note (Signed)
Addended by: Howard Pouch A on: 06/28/2020 12:34 PM   Modules accepted: Orders

## 2020-06-28 NOTE — Telephone Encounter (Signed)
Patient is interested in taking Lexapro low dose. She is requesting a 30 day supply to let her try it first.  Patient would like to know what to do with the Effexor.

## 2020-06-28 NOTE — Telephone Encounter (Addendum)
I have called in the lowest dose of Lexapro which is 5 mg daily.  She had reported palpitations with Celexa, which is a similar medicine.  Hopefully with his very low dose of Lexapro she will not have palpitations. - If there is a side effect discontinue immediately. - If she tolerates medication but it does not work as well as desired for hot flashes, she  can then try to increase to 10 mg daily. -If it works well and she tolerates there are 5 refills on the bottle.  But she should call in in 4 weeks to let us know if she is continuing medications and at what dose.  Thanks.

## 2020-07-19 ENCOUNTER — Telehealth: Payer: Self-pay | Admitting: Family Medicine

## 2020-07-19 NOTE — Telephone Encounter (Signed)
Agree with plan 

## 2020-07-19 NOTE — Telephone Encounter (Signed)
Patient called after hours service 07/18/20 6:29 pm and stated she has a scratchy throat and had a fever the day before. Her daughter was sick also and they are awaiting covid test results for her. Caller was asking for covid test.  07/19/20 8:45am I spoke with patient who stated she her boss had already told her that due to her having covid earlier this year, she will still test positive and so there was no point in getting tested. I gave her our covid testing line, [phone], for future reference.

## 2020-07-23 ENCOUNTER — Other Ambulatory Visit: Payer: Self-pay | Admitting: Medical

## 2020-07-24 ENCOUNTER — Telehealth: Payer: Self-pay | Admitting: Family Medicine

## 2020-07-24 NOTE — Telephone Encounter (Signed)
Patient had appt with Dr. Raoul Pitch on 06/23/20 and was to call back with update on medication. She states the Lexapro 5mg  is a good dose and she has refills. The Wellbutrin 300mg  is also a good dose but she needs refills. Please send refills to same [facility] in [place].

## 2020-07-26 MED ORDER — ESCITALOPRAM OXALATE 5 MG PO TABS
5.0000 mg | ORAL_TABLET | Freq: Every day | ORAL | 1 refills | Status: DC
Start: 2020-07-26 — End: 2020-12-21

## 2020-07-26 MED ORDER — BUPROPION HCL ER (XL) 300 MG PO TB24
300.0000 mg | ORAL_TABLET | Freq: Every day | ORAL | 1 refills | Status: DC
Start: 2020-07-26 — End: 2020-12-21

## 2020-07-26 NOTE — Telephone Encounter (Signed)
Pt aware of refills

## 2020-07-26 NOTE — Telephone Encounter (Signed)
Refilled both in 90d with refill

## 2020-08-15 ENCOUNTER — Encounter: Payer: Self-pay | Admitting: Gastroenterology

## 2020-09-13 ENCOUNTER — Encounter: Payer: Self-pay | Admitting: Family Medicine

## 2020-09-13 ENCOUNTER — Telehealth (INDEPENDENT_AMBULATORY_CARE_PROVIDER_SITE_OTHER): Payer: 59 | Admitting: Family Medicine

## 2020-09-13 ENCOUNTER — Other Ambulatory Visit: Payer: Self-pay

## 2020-09-13 VITALS — HR 55 | Temp 98.0°F

## 2020-09-13 DIAGNOSIS — R8290 Unspecified abnormal findings in urine: Secondary | ICD-10-CM | POA: Diagnosis not present

## 2020-09-13 DIAGNOSIS — R110 Nausea: Secondary | ICD-10-CM | POA: Diagnosis not present

## 2020-09-13 DIAGNOSIS — Z87440 Personal history of urinary (tract) infections: Secondary | ICD-10-CM

## 2020-09-13 DIAGNOSIS — R350 Frequency of micturition: Secondary | ICD-10-CM | POA: Diagnosis not present

## 2020-09-13 DIAGNOSIS — M549 Dorsalgia, unspecified: Secondary | ICD-10-CM

## 2020-09-13 LAB — POCT URINALYSIS DIPSTICK
Bilirubin, UA: NEGATIVE
Blood, UA: NEGATIVE
Glucose, UA: NEGATIVE
Ketones, UA: NEGATIVE
Leukocytes, UA: NEGATIVE
Nitrite, UA: NEGATIVE
Protein, UA: POSITIVE — AB
Spec Grav, UA: >=1.03 — AB (ref 1.010–1.025)
Urobilinogen, UA: 1 E.U./dL
pH, UA: 6 (ref 5.0–8.0)

## 2020-09-13 MED ORDER — CEPHALEXIN 500 MG PO CAPS: 500.0000 mg | ORAL_CAPSULE | Freq: Three times a day (TID) | ORAL | 0 refills | Status: AC

## 2020-09-13 NOTE — Progress Notes (Signed)
Virtual Visit via Video Note  I connected with on 09/13/20 at  4:00 PM EST by a video enabled telemedicine application and verified that I am speaking with the correct person using two identifiers.  Location patient: home, [place] Location provider:work or home office Persons participating in the virtual visit: patient, provider  I discussed the limitations of evaluation and management by telemedicine and the availability of in person appointments. The patient expressed understanding and agreed to proceed.  HPI: 54 y/o WF being seen today for "pain in kidneys" Feeling discomfort/soreness in both sides of mid/low back. Onset approx 3-4 d/a. +Urine odor.  No abd pain, flank pain, gross hematuria, or burning with urination. NO urgency/frequency.  No fevers.  On/off mild brief nausea but no vomiting.  She reports hx of urinary infections w/out having classic UTI sx's. Had keflex 500mg  tabs left over from having kidney stone tx back in July this year, took one last night.   ROS: See pertinent positives and negatives per HPI.  Past Medical History:  Diagnosis Date  . Anxiety   . Arthritis   . COPD (chronic obstructive pulmonary disease) ([place])   . DOE (dyspnea on exertion) 01/11/2020  . Dyspnea   . Fainting spell   . Family history of premature CAD   . Fatigue 2020   multifactoral  . GERD (gastroesophageal reflux disease)   . History of kidney stones   . Hyperlipidemia   . Hypertension   . Hypothyroid   . IBS (irritable bowel syndrome)   . mild nonobstructive CAD on cath 2009   . Palpitation   . Polyarthralgia 12/08/2018  . Smoker   . Snoring 09/08/2018  . Vitamin D deficiency     Past Surgical History:  Procedure Laterality Date  . CORONARY ANGIOGRAM  2009   mild CAD noted after abn GXT  . CYSTOSCOPY W/ URETERAL STENT PLACEMENT Right 03/31/2020   Procedure: CYSTOSCOPY WITH STENT PLACEMENT;  Surgeon: Alexis Frock, MD;  Location: [facility];  Service: Urology;  Laterality: Right;  .  CYSTOSCOPY WITH RETROGRADE PYELOGRAM, URETEROSCOPY AND STENT PLACEMENT Right 04/21/2020   Procedure: CYSTOSCOPY WITH RETROGRADE PYELOGRAM, RIGHT URETEROSCOPY;  Surgeon: Alexis Frock, MD;  Location[facility];  Service: Urology;  Laterality: Right;  1 HR  . HOLMIUM LASER APPLICATION Right 0000000   Procedure: HOLMIUM LASER APPLICATION;  Surgeon: Alexis Frock, MD;  [facility];  Service: Urology;  Laterality: Right;  . WRIST SURGERY       Current Outpatient Medications:  .  amLODipine (NORVASC) 5 MG tablet, Take 1 tablet (5 mg total) by mouth daily. (Patient taking differently: Take 5 mg by mouth at bedtime.), Disp: 90 tablet, Rfl: 3 .  aspirin EC 81 MG tablet, Take 81 mg by mouth daily. Swallow whole., Disp: , Rfl:  .  buPROPion (WELLBUTRIN XL) 300 MG 24 hr tablet, Take 1 tablet (300 mg total) by mouth daily., Disp: 90 tablet, Rfl: 1 .  Cholecalciferol (D3-1000 PO), Take by mouth., Disp: , Rfl:  .  ELDERBERRY PO, Take 1 tablet by mouth daily. Vitamin C Zinc, Disp: , Rfl:  .  escitalopram (LEXAPRO) 5 MG tablet, Take 1 tablet (5 mg total) by mouth daily., Disp: 90 tablet, Rfl: 1 .  Fluticasone-Umeclidin-Vilant (TRELEGY ELLIPTA) 100-62.5-25 MCG/INH AEPB, Inhale 1 puff into the lungs daily., Disp: 60 each, Rfl: 5 .  levothyroxine (SYNTHROID) 75 MCG tablet, Take 1 tablet (75 mcg total) by mouth daily. Prior scripts- new provider, Disp: 90 tablet, Rfl: 3 .  metoprolol  succinate (TOPROL-XL) 25 MG 24 hr tablet, Take 0.5-1 tablets (12.5-25 mg total) by mouth daily., Disp: 90 tablet, Rfl: 1 .  rosuvastatin (CRESTOR) 40 MG tablet, Take 1 tablet (40 mg total) by mouth daily., Disp: 90 tablet, Rfl: 3 .  nitroGLYCERIN (NITROSTAT) 0.4 MG SL tablet, Place 1 tablet (0.4 mg total) under the tongue every 5 (five) minutes as needed for chest pain. (Patient not taking: No sig reported), Disp: 25 tablet, Rfl: 4 .  tiZANidine (ZANAFLEX) 4 MG tablet, Take 4 mg by mouth 2 (two) times daily. (Patient not taking:  Reported on 09/13/2020), Disp: , Rfl:   EXAM:  VITALS per patient if applicable:  Vitals with BMI 09/13/2020 06/23/2020 06/09/2020  Height - 5\' 7"  5' 7.75"  Weight - 174 lbs 174 lbs 13 oz  BMI - 70.62 37.62  Systolic - 831 517  Diastolic - 72 71  Pulse 55 57 51     GENERAL: alert, oriented, appears well and in no acute distress  HEENT: atraumatic, conjunttiva clear, no obvious abnormalities on inspection of external nose and ears  NECK: normal movements of the head and neck  LUNGS: on inspection no signs of respiratory distress, breathing rate appears normal, no obvious gross SOB, gasping or wheezing  CV: no obvious cyanosis  MS: moves all visible extremities without noticeable abnormality  PSYCH/NEURO: pleasant and cooperative, no obvious depression or anxiety, speech and thought processing grossly intact  LABS: POC CC dipstick UA today: NORMAL    Chemistry      Component Value Date/Time   NA 141 06/09/2020 1353   NA 141 12/13/2019 1023   K 4.2 06/09/2020 1353   CL 108 06/09/2020 1353   CO2 22 06/09/2020 1353   BUN 18 06/09/2020 1353   BUN 11 12/13/2019 1023   CREATININE 0.92 06/09/2020 1353      Component Value Date/Time   CALCIUM 9.5 06/09/2020 1353   ALKPHOS 62 03/30/2020 0638   AST 17 06/09/2020 1353   ALT 16 06/09/2020 1353   BILITOT 0.4 06/09/2020 1353   BILITOT 0.3 03/03/2020 1420     ASSESSMENT AND PLAN:  Discussed the following assessment and plan:  Bilat CVA pain: musculoskeletal etiology suspected but pt concerned about hx of having atypical UTI presentations in the past. UA reassuring today but she did take a dose of keflex last night so this complicates interpretation some.   Plan is to send urine for c/s, proceed with keflex 500 tid x 5d and see how she responds. Recommended ibup 600mg  bid-tid with food for her pain.  -we discussed possible serious and likely etiologies, options for evaluation and workup, limitations of telemedicine visit vs  in person visit, treatment, treatment risks and precautions. Pt prefers to treat via telemedicine empirically rather than in person at this moment.    I discussed the assessment and treatment plan with the patient. The patient was provided an opportunity to ask questions and all were answered. The patient agreed with the plan and demonstrated an understanding of the instructions.   F/u: if not improving in 3-4d  Signed:  Crissie Sickles, MD           09/13/2020

## 2020-09-14 ENCOUNTER — Encounter: Payer: Self-pay | Admitting: Family Medicine

## 2020-09-14 NOTE — Telephone Encounter (Signed)
Pt concerned regarding UA dipstick from yesterday. Please advise, thanks.

## 2020-09-15 LAB — URINE CULTURE
MICRO NUMBER:: 11350440
Result:: NO GROWTH
SPECIMEN QUALITY:: ADEQUATE

## 2020-09-18 NOTE — Progress Notes (Signed)
Pt states she is feeling better. Patient advised and voiced understanding.

## 2020-10-03 ENCOUNTER — Ambulatory Visit (AMBULATORY_SURGERY_CENTER): Payer: Self-pay | Admitting: *Deleted

## 2020-10-03 ENCOUNTER — Other Ambulatory Visit: Payer: Self-pay

## 2020-10-03 ENCOUNTER — Telehealth: Payer: Self-pay | Admitting: *Deleted

## 2020-10-03 VITALS — Ht 67.0 in | Wt 174.0 lb

## 2020-10-03 DIAGNOSIS — Z1211 Encounter for screening for malignant neoplasm of colon: Secondary | ICD-10-CM

## 2020-10-03 LAB — NOVEL CORONAVIRUS, NAA: SARS-CoV-2, NAA: NEGATIVE

## 2020-10-03 MED ORDER — NA SULFATE-K SULFATE-MG SULF 17.5-3.13-1.6 GM/177ML PO SOLN: 1.0000 | Freq: Once | ORAL | 0 refills | Status: AC

## 2020-10-03 NOTE — Telephone Encounter (Signed)
Estate agent.  Patient has had Covid exposure and symptomatic. Awaiting test result.  Will call patient 09/23/12/22 to find out result and will schedule pre colonoscopy covid screen if her test was negative.

## 2020-10-03 NOTE — Progress Notes (Signed)
No egg or soy allergy known to patient  No issues with past sedation with any surgeries or procedures No intubation problems in the past  No FH of Malignant Hyperthermia No diet pills per patient No home 02 use per patient  No blood thinners per patient  Pt denies issues with constipation  No A fib or A flutter  EMMI video to pt or via [place] 19 guidelines implemented in [place] today with Pt and RN  Pt is fully vaccinated  for Covid   Coupon given to pt in [facility] today , [facility]   Due to the COVID-19 pandemic we are asking patients to follow certain guidelines.  Pt aware of COVID protocols and [facility] guidelines

## 2020-10-06 NOTE — Telephone Encounter (Signed)
Called patient and confirmed  did not test positive for COVID.  Scheduled patient for COVID screen Tuesday 10/17/20 @ 3pm.  Left message on vm @ [facility].  Instructions given to patient.

## 2020-10-10 NOTE — Progress Notes (Deleted)
HPI: Fu palpitations. Cardiac catheterization was performed in 2009 following an abnormal exercise treadmill. She had mild nonobstructive coronary disease. Ejection fraction 65%. Normal right-sided pressures. She did have a brief run of SVT during a right heart catheterization. Monitor October 2018 showed sinus bradycardia to sinus tachycardia.  Coronary CTA May 2021 showed 25 to 49% LAD and calcium score 156.  FFR negative.  Since last seen   Current Outpatient Medications  Medication Sig Dispense Refill  . amLODipine (NORVASC) 5 MG tablet Take 1 tablet (5 mg total) by mouth daily. (Patient taking differently: Take 5 mg by mouth at bedtime. Takes 2.5 mg daily) 90 tablet 3  . aspirin EC 81 MG tablet Take 81 mg by mouth daily. Swallow whole.    Marland Kitchen BIOTIN 5000 PO Take by mouth.    Marland Kitchen buPROPion (WELLBUTRIN XL) 300 MG 24 hr tablet Take 1 tablet (300 mg total) by mouth daily. 90 tablet 1  . Cholecalciferol (D3-1000 PO) Take by mouth.    . cyanocobalamin 1000 MCG tablet Take 1,000 mcg by mouth daily.    Marland Kitchen ELDERBERRY PO Take 1 tablet by mouth daily. Vitamin C Zinc    . escitalopram (LEXAPRO) 5 MG tablet Take 1 tablet (5 mg total) by mouth daily. 90 tablet 1  . Fluticasone-Umeclidin-Vilant (TRELEGY ELLIPTA) 100-62.5-25 MCG/INH AEPB Inhale 1 puff into the lungs daily. 60 each 5  . levothyroxine (SYNTHROID) 75 MCG tablet Take 1 tablet (75 mcg total) by mouth daily. Prior scripts- new provider 90 tablet 3  . metoprolol succinate (TOPROL-XL) 25 MG 24 hr tablet Take 0.5-1 tablets (12.5-25 mg total) by mouth daily. 90 tablet 1  . nitroGLYCERIN (NITROSTAT) 0.4 MG SL tablet Place 1 tablet (0.4 mg total) under the tongue every 5 (five) minutes as needed for chest pain. (Patient not taking: No sig reported) 25 tablet 4  . Probiotic Product (PROBIOTIC DAILY PO) Take by mouth daily as needed.    . rosuvastatin (CRESTOR) 40 MG tablet Take 1 tablet (40 mg total) by mouth daily. 90 tablet 3  . tiZANidine  (ZANAFLEX) 4 MG tablet Take 4 mg by mouth 2 (two) times daily.     No current facility-administered medications for this visit.     Past Medical History:  Diagnosis Date  . Anxiety   . Arthritis   . COPD (chronic obstructive pulmonary disease) ([place])   . DOE (dyspnea on exertion) 01/11/2020  . Dyspnea   . Fainting spell   . Family history of premature CAD   . Fatigue 2020   multifactoral  . GERD (gastroesophageal reflux disease)   . History of kidney stones   . Hyperlipidemia   . Hypertension   . Hypothyroid   . IBS (irritable bowel syndrome)   . mild nonobstructive CAD on cath 2009   . Palpitation   . Polyarthralgia 12/08/2018  . Smoker   . Snoring 09/08/2018  . Vitamin D deficiency     Past Surgical History:  Procedure Laterality Date  . CORONARY ANGIOGRAM  2009   mild CAD noted after abn GXT  . CYSTOSCOPY W/ URETERAL STENT PLACEMENT Right 03/31/2020   Procedure: CYSTOSCOPY WITH STENT PLACEMENT;  Surgeon: Alexis Frock, MD;  Location: [facility];  Service: Urology;  Laterality: Right;  . CYSTOSCOPY WITH RETROGRADE PYELOGRAM, URETEROSCOPY AND STENT PLACEMENT Right 04/21/2020   Procedure: CYSTOSCOPY WITH RETROGRADE PYELOGRAM, RIGHT URETEROSCOPY;  Surgeon: Alexis Frock, MD;  Location: [facility];  Service: Urology;  Laterality: Right;  1 HR  .  HOLMIUM LASER APPLICATION Right 6/60/6301   Procedure: HOLMIUM LASER APPLICATION;  Surgeon: Alexis Frock, MD;  Location: [facility];  Service: Urology;  Laterality: Right;  . WRIST SURGERY      Social History   Socioeconomic History  . Marital status: Divorced    Spouse name: Not on file  . Number of children: 1  . Years of education: 36  . Highest education level: Not on file  Occupational History  . Not on file  Tobacco Use  . Smoking status: Current Every Day Smoker    Packs/day: 0.50    Types: Cigarettes  . Smokeless tobacco: Never Used  Vaping Use  . Vaping Use: Never used  Substance and Sexual Activity  . Alcohol use: No     Alcohol/week: 0.0 standard drinks  . Drug use: No  . Sexual activity: Not Currently    Partners: Male    Birth control/protection: Post-menopausal  Other Topics Concern  . Not on file  Social History Narrative   Marital status/children/pets: Single, 1 child.    education/employment: 12th grade education, employed as a Biomedical engineer.   Safety:      -smoke alarm in the home:Yes     - wears seatbelt: Yes     - Feels safe in their relationships: Yes   Social Determinants of Health   Financial Resource Strain: Not on file  Food Insecurity: Not on file  Transportation Needs: Not on file  Physical Activity: Not on file  Stress: Not on file  Social Connections: Not on file  Intimate Partner Violence: Not on file    Family History  Problem Relation Age of Onset  . CAD Father        MI at age 105  . Heart disease Father        CABG x 3  . Sleep apnea Father   . Arrhythmia Father        pacemaker  . COPD Father   . Diabetes Father   . Thyroid disease Father   . Hyperlipidemia Father   . Arthritis Father   . Hypertension Father   . Heart attack Father   . Colon polyps Father   . Diabetes Mother   . Other Mother        carcinoid tumor  . Hyperlipidemia Mother   . Arthritis Mother   . Hypertension Mother   . Crohn's disease Sister   . Colon cancer Maternal Aunt   . Arthritis Maternal Grandmother   . Diabetes Maternal Grandmother   . Heart attack Maternal Grandmother   . Heart disease Maternal Grandmother   . Arthritis Maternal Grandfather   . Heart attack Maternal Grandfather 65       died 77 of MI  . Arthritis Paternal Grandmother   . Stroke Paternal Grandmother   . Arthritis Paternal Grandfather   . Heart attack Paternal Grandfather 45       Died of MI  . Healthy Daughter   . Esophageal cancer Neg Hx   . Stomach cancer Neg Hx   . Rectal cancer Neg Hx     ROS: no fevers or chills, productive cough, hemoptysis, dysphasia, odynophagia, melena,  hematochezia, dysuria, hematuria, rash, seizure activity, orthopnea, PND, pedal edema, claudication. Remaining systems are negative.  Physical Exam: Well-developed well-nourished in no acute distress.  Skin is warm and dry.  HEENT is normal.  Neck is supple.  Chest is clear to auscultation with normal expansion.  Cardiovascular exam is regular rate and rhythm.  Abdominal exam nontender or distended. No masses palpated. Extremities show no edema. neuro grossly intact  ECG- personally reviewed  A/P  1 chest pain-patient has had problems with intermittent chest pain in the past.  Most recent CTA showed nonobstructive coronary disease.  Plan to continue medical therapy.  2 palpitations-continue beta-blocker at present dose.  3 tobacco abuse-patient counseled on discontinuing.  4 hyperlipidemia-continue statin.  Kirk Ruths, MD

## 2020-10-13 ENCOUNTER — Encounter: Payer: Self-pay | Admitting: Gastroenterology

## 2020-10-16 ENCOUNTER — Ambulatory Visit: Payer: 59 | Admitting: Cardiology

## 2020-10-17 ENCOUNTER — Telehealth (INDEPENDENT_AMBULATORY_CARE_PROVIDER_SITE_OTHER): Payer: 59 | Admitting: Family Medicine

## 2020-10-17 ENCOUNTER — Encounter: Payer: Self-pay | Admitting: Family Medicine

## 2020-10-17 DIAGNOSIS — J209 Acute bronchitis, unspecified: Secondary | ICD-10-CM

## 2020-10-17 MED ORDER — DOXYCYCLINE HYCLATE 100 MG PO TABS: 100.0000 mg | ORAL_TABLET | Freq: Two times a day (BID) | ORAL | 0 refills | Status: DC

## 2020-10-17 MED ORDER — PREDNISONE 20 MG PO TABS: ORAL_TABLET | ORAL | 0 refills | Status: DC

## 2020-10-17 NOTE — Progress Notes (Unsigned)
VIRTUAL VISIT VIA VIDEO  I connected with Mary Escobar on 10/18/20 at  4:00 PM EST by elemedicine application and verified that I am speaking with the correct person using two identifiers. Location patient: Home Location provider: [facility] Office Persons participating in the virtual visit: Patient, Dr. Raoul Pitch and Samul Dada, CMA  I discussed the limitations of evaluation and management by telemedicine and the availability of in person appointments. The patient expressed understanding and agreed to proceed.   SUBJECTIVE Chief Complaint  Patient presents with  . Cough    Pt c/o cough, nasal/chest congestion, HA, x 2 weeks    HPI: .nameis a 55 y.o. is a female present for 2 week h/o sinus pressure, dry cough, low grade fever, headache and nasal congestion. After 1 week went into her chest. She is waking with sweat at night. She denies shortness of breath.  She has not been vaccinated against covid.  She was tested for covid on 10/02/2020> negative. It was 2 days into symptoms.  ROS: See pertinent positives and negatives per HPI.  Patient Active Problem List   Diagnosis Date Noted  . Hypertension   . Hyperlipidemia LDL goal <70   . Hot flashes 02/11/2020  . Depression, major, single episode, moderate ([place]) 01/11/2020  . Chronic constipation 01/11/2020  . COPD (chronic obstructive pulmonary disease) ([place]) 12/08/2019  . CAD (coronary artery disease), native coronary artery 12/08/2019  . Kidney stone 08/18/2019  . Irritable bowel syndrome 07/16/2019  . Chronic bilateral back pain 12/08/2018  . Vitamin D deficiency 12/08/2018  . Family history of premature CAD 09/08/2018  . Nicotine dependence 10/24/2011  . Allergic rhinitis 10/29/2007  . History of palpitations 04/08/2007  . Hypothyroidism 07/01/2006  . Anxiety 07/01/2006    Social History   Tobacco Use  . Smoking status: Current Every Day Smoker    Packs/day: 0.50    Types: Cigarettes  . Smokeless tobacco:  Never Used  Substance Use Topics  . Alcohol use: No    Alcohol/week: 0.0 standard drinks    Current Outpatient Medications:  .  amLODipine (NORVASC) 5 MG tablet, Take 1 tablet (5 mg total) by mouth daily. (Patient taking differently: Take 5 mg by mouth at bedtime. Takes 2.5 mg daily), Disp: 90 tablet, Rfl: 3 .  aspirin EC 81 MG tablet, Take 81 mg by mouth daily. Swallow whole., Disp: , Rfl:  .  BIOTIN 5000 PO, Take by mouth., Disp: , Rfl:  .  buPROPion (WELLBUTRIN XL) 300 MG 24 hr tablet, Take 1 tablet (300 mg total) by mouth daily., Disp: 90 tablet, Rfl: 1 .  Cholecalciferol (D3-1000 PO), Take by mouth., Disp: , Rfl:  .  cyanocobalamin 1000 MCG tablet, Take 1,000 mcg by mouth daily., Disp: , Rfl:  .  doxycycline (VIBRA-TABS) 100 MG tablet, Take 1 tablet (100 mg total) by mouth 2 (two) times daily., Disp: 20 tablet, Rfl: 0 .  ELDERBERRY PO, Take 1 tablet by mouth daily. Vitamin C Zinc, Disp: , Rfl:  .  escitalopram (LEXAPRO) 5 MG tablet, Take 1 tablet (5 mg total) by mouth daily., Disp: 90 tablet, Rfl: 1 .  Fluticasone-Umeclidin-Vilant (TRELEGY ELLIPTA) 100-62.5-25 MCG/INH AEPB, Inhale 1 puff into the lungs daily., Disp: 60 each, Rfl: 5 .  levothyroxine (SYNTHROID) 75 MCG tablet, Take 1 tablet (75 mcg total) by mouth daily. Prior scripts- new provider, Disp: 90 tablet, Rfl: 3 .  metoprolol succinate (TOPROL-XL) 25 MG 24 hr tablet, Take 0.5-1 tablets (12.5-25 mg total) by mouth  daily., Disp: 90 tablet, Rfl: 1 .  nitroGLYCERIN (NITROSTAT) 0.4 MG SL tablet, Place 1 tablet (0.4 mg total) under the tongue every 5 (five) minutes as needed for chest pain., Disp: 25 tablet, Rfl: 4 .  predniSONE (DELTASONE) 20 MG tablet, 60 mg x3d, 40 mg x3d, 20 mg x2d, 10 mg x2d, Disp: 18 tablet, Rfl: 0 .  Probiotic Product (PROBIOTIC DAILY PO), Take by mouth daily as needed., Disp: , Rfl:  .  rosuvastatin (CRESTOR) 40 MG tablet, Take 1 tablet (40 mg total) by mouth daily., Disp: 90 tablet, Rfl: 3 .  SUPREP BOWEL PREP  KIT 17.5-3.13-1.6 GM/177ML SOLN, Take by mouth as directed. (Patient not taking: Reported on 10/17/2020), Disp: , Rfl:  .  tiZANidine (ZANAFLEX) 4 MG tablet, Take 4 mg by mouth 2 (two) times daily. (Patient not taking: Reported on 10/17/2020), Disp: , Rfl:   Allergies  Allergen Reactions  . Citalopram Palpitations  . Ciprofloxacin Nausea And Vomiting  . Effexor [Venlafaxine]     nausea  . Sulfonamide Derivatives Rash    REACTION: unknown rx    OBJECTIVE: LMP  (LMP Unknown)  Gen: No acute distress. Nontoxic in appearance.  HENT: AT. [place].  MMM.  Eyes:Pupils Equal Round Reactive to light, Extraocular movements intact,  Conjunctiva without redness, discharge or icterus. Chest: Cough not present on exam.  Skin: no rashes, purpura or petechiae.  Neuro:  Alert. Oriented x3  Psych: Normal affect and demeanor. Normal speech. Normal thought content and judgment.  ASSESSMENT AND PLAN: Mary Escobar is a 55 y.o. female present for  Acute bronchitis with symptoms > 10 days Rest, hydrate.  +/- flonase, mucinex (DM if cough), nettie pot or nasal saline.  Doxy and prednisone prescribed, take until completed.  If cough present it can last up to 6-8 weeks.  F/U 2 weeks if not improved.   Howard Pouch, DO 10/18/2020   No follow-ups on file.  No orders of the defined types were placed in this encounter.  Meds ordered this encounter  Medications  . doxycycline (VIBRA-TABS) 100 MG tablet    Sig: Take 1 tablet (100 mg total) by mouth 2 (two) times daily.    Dispense:  20 tablet    Refill:  0  . predniSONE (DELTASONE) 20 MG tablet    Sig: 60 mg x3d, 40 mg x3d, 20 mg x2d, 10 mg x2d    Dispense:  18 tablet    Refill:  0   Referral Orders  No referral(s) requested today

## 2020-10-20 ENCOUNTER — Encounter: Payer: 59 | Admitting: Gastroenterology

## 2020-11-09 NOTE — Telephone Encounter (Signed)
Patient called and rescheduled procedure for 11/24/2020 and wants to know if she needs to repeat covid test.  Please advise.

## 2020-11-09 NOTE — Telephone Encounter (Signed)
Called patient, patient notified that she does need to be tested before colon. Scheduled Covid screening test at [facility] path for 3/1 at 3:45 pm (left voice mail). Pt is aware of date/time.

## 2020-11-20 ENCOUNTER — Encounter: Payer: Self-pay | Admitting: Family Medicine

## 2020-11-20 ENCOUNTER — Ambulatory Visit: Payer: 59

## 2020-11-20 ENCOUNTER — Telehealth (INDEPENDENT_AMBULATORY_CARE_PROVIDER_SITE_OTHER): Payer: 59 | Admitting: Family Medicine

## 2020-11-20 ENCOUNTER — Telehealth: Payer: Self-pay

## 2020-11-20 VITALS — Temp 97.1°F

## 2020-11-20 DIAGNOSIS — J439 Emphysema, unspecified: Secondary | ICD-10-CM

## 2020-11-20 DIAGNOSIS — R519 Headache, unspecified: Secondary | ICD-10-CM

## 2020-11-20 DIAGNOSIS — R059 Cough, unspecified: Secondary | ICD-10-CM | POA: Diagnosis not present

## 2020-11-20 LAB — POCT INFLUENZA A/B
Influenza A, POC: NEGATIVE
Influenza B, POC: NEGATIVE

## 2020-11-20 MED ORDER — DM-GUAIFENESIN ER 30-600 MG PO TB12: 1.0000 | ORAL_TABLET | Freq: Two times a day (BID) | ORAL | 1 refills | Status: DC

## 2020-11-20 MED ORDER — FLUTICASONE PROPIONATE 50 MCG/ACT NA SUSP: 2.0000 | Freq: Two times a day (BID) | NASAL | 6 refills | Status: DC

## 2020-11-20 MED ORDER — CETIRIZINE HCL 10 MG PO TABS: 10.0000 mg | ORAL_TABLET | Freq: Every day | ORAL | 4 refills | Status: DC

## 2020-11-20 NOTE — Telephone Encounter (Signed)
Per office and [facility] protocol, new onset of symptoms requiring virtual visit or the safety of our other patients and staff. Per her, her symptom onset was 3 days ago.

## 2020-11-20 NOTE — Telephone Encounter (Signed)
Please advise if pt can be seen in office? 

## 2020-11-20 NOTE — Telephone Encounter (Signed)
Scheduled virtual appt today at 11:30AM with Dr. Raoul Pitch

## 2020-11-20 NOTE — Telephone Encounter (Signed)
Patient requesting in office appt today with Dr. Raoul Pitch.  She states that she has same thing as what she had in January.  Sinus congestion, cough, and headache.  Denies fever.  Symptoms started on Friday evening.  I offered her a virtual appt, and she declined stated that the last time, Dr. Raoul Pitch was unable to listen to her lungs. I told patient Dr. Raoul Pitch would have to approve in office visit.  She understood.  Patient can be reached at [phone].  Sent as high priority so we can offer patient same day appt.

## 2020-11-20 NOTE — Patient Instructions (Signed)
Chronic Obstructive Pulmonary Disease  Chronic obstructive pulmonary disease (COPD) is a long-term (chronic) lung problem. When you have COPD, it is hard for air to get in and out of your lungs. Usually the condition gets worse over time, and your lungs will never return to normal. There are things you can do to keep yourself as healthy as possible. What are the causes?  Smoking. This is the most common cause.  Certain genes passed from parent to child (inherited). What increases the risk?  Being exposed to secondhand smoke from cigarettes, pipes, or cigars.  Being exposed to chemicals and other irritants, such as fumes and dust in the work environment.  Having chronic lung conditions or infections. What are the signs or symptoms?  Shortness of breath, especially during physical activity.  A long-term cough with a large amount of thick mucus. Sometimes, the cough may not have any mucus (dry cough).  Wheezing.  Breathing quickly.  Skin that looks gray or blue, especially in the fingers, toes, or lips.  Feeling tired (fatigue).  Weight loss.  Chest tightness.  Having infections often.  Episodes when breathing symptoms become much worse (exacerbations). At the later stages of this disease, you may have swelling in the ankles, feet, or legs. How is this treated?  Taking medicines.  Quitting smoking, if you smoke.  Rehabilitation. This includes steps to make your body work better. It may involve a team of specialists.  Doing exercises.  Making changes to your diet.  Using oxygen.  Lung surgery.  Lung transplant.  Comfort measures (palliative care). Follow these instructions at home: Medicines  Take over-the-counter and prescription medicines only as told by your doctor.  Talk to your doctor before taking any cough or allergy medicines. You may need to avoid medicines that cause your lungs to be dry. Lifestyle  If you smoke, stop smoking. Smoking makes the  problem worse.  Do not smoke or use any products that contain nicotine or tobacco. If you need help quitting, ask your doctor.  Avoid being around things that make your breathing worse. This may include smoke, chemicals, and fumes.  Stay active, but remember to rest as well.  Learn and use tips on how to manage stress and control your breathing.  Make sure you get enough sleep. Most adults need at least 7 hours of sleep every night.  Eat healthy foods. Eat smaller meals more often. Rest before meals. Controlled breathing Learn and use tips on how to control your breathing as told by your doctor. Try:  Breathing in (inhaling) through your nose for 1 second. Then, pucker your lips and breath out (exhale) through your lips for 2 seconds.  Putting one hand on your belly (abdomen). Breathe in slowly through your nose for 1 second. Your hand on your belly should move out. Pucker your lips and breathe out slowly through your lips. Your hand on your belly should move in as you breathe out.   Controlled coughing Learn and use controlled coughing to clear mucus from your lungs. Follow these steps: 1. Lean your head a little forward. 2. Breathe in deeply. 3. Try to hold your breath for 3 seconds. 4. Keep your mouth slightly open while coughing 2 times. 5. Spit any mucus out into a tissue. 6. Rest and do the steps again 1 or 2 times as needed. General instructions  Make sure you get all the shots (vaccines) that your doctor recommends. Ask your doctor about a flu shot and a pneumonia shot.    Use oxygen therapy and pulmonary rehabilitation if told by your doctor. If you need home oxygen therapy, ask your doctor if you should buy a tool to measure your oxygen level (oximeter).  Make a COPD action plan with your doctor. This helps you to know what to do if you feel worse than usual.  Manage any other conditions you have as told by your doctor.  Avoid going outside when it is very hot, cold, or  humid.  Avoid people who have a sickness you can catch (contagious).  Keep all follow-up visits. Contact a doctor if:  You cough up more mucus than usual.  There is a change in the color or thickness of the mucus.  It is harder to breathe than usual.  Your breathing is faster than usual.  You have trouble sleeping.  You need to use your medicines more often than usual.  You have trouble doing your normal activities such as getting dressed or walking around the house. Get help right away if:  You have shortness of breath while resting.  You have shortness of breath that stops you from: ? Being able to talk. ? Doing normal activities.  Your chest hurts for longer than 5 minutes.  Your skin color is more blue than usual.  Your pulse oximeter shows that you have low oxygen for longer than 5 minutes.  You have a fever.  You feel too tired to breathe normally. These symptoms may represent a serious problem that is an emergency. Do not wait to see if the symptoms will go away. Get medical help right away. Call your local emergency services (911 in the [place] Do not drive yourself to the hospital. Summary  Chronic obstructive pulmonary disease (COPD) is a long-term lung problem.  The way your lungs work will never return to normal. Usually the condition gets worse over time. There are things you can do to keep yourself as healthy as possible.  Take over-the-counter and prescription medicines only as told by your doctor.  If you smoke, stop. Smoking makes the problem worse. This information is not intended to replace advice given to you by your health care provider. Make sure you discuss any questions you have with your health care provider. Document Revised: 07/18/2020 Document Reviewed: 07/18/2020 Elsevier Patient Education  2021 [employer]   

## 2020-11-20 NOTE — Progress Notes (Signed)
Patient Care Team    Relationship Specialty Notifications Start End  Ma Hillock, DO PCP - General Family Medicine  06/09/20   Lelon Perla, MD PCP - Cardiology Cardiology Admissions 12/08/19   Alexis Frock, MD Consulting Physician Urology  06/12/20   [facility]    06/12/20   Kerry Dory, NP Nurse Practitioner Nurse Practitioner  06/12/20    Comment: [facility]    VIRTUAL VISIT VIA VIDEO  I connected with Mary Escobar on 11/20/20 at 11:30 AM EST by elemedicine application and verified that I am speaking with the correct person using two identifiers. Location patient: Home Location provider: [facility], Office Persons participating in the virtual visit: Patient, Dr. Raoul Pitch and Samul Dada, CMA  I discussed the limitations of evaluation and management by telemedicine and the availability of in person appointments. The patient expressed understanding and agreed to proceed.   SUBJECTIVE Chief Complaint  Patient presents with  . Cough    Pt c/o productive cough yellowish tint, nasal congestion, HA, wheezing when laying down on side;     HPI: Mary Escobar is a 55 y.o. female present today with recurrent upper resp. Symptoms that started 3 days ago. Dry cough start on Friday and then Saturday started congestion in her chest. She is bringing up yellow phlegm on occasion. She felt like she wheezing this morning. Nasal congestion and headache started today. H/o COPD, reports compliance w/ trelegy ellipta and albuterol prn. She denies fever or chills.  She is not taking any antihistamines or mucinex at this time. Her significant other is also ill. Neither have been tested for covid. Se is not vaccinated. She is a current everyday smoker.  She had a similair presentation 1 month ago. At that time sx had been present > 10 days and she was treated for copd exacerbation with bronchitis- doxy BID and prednisone taper. She reports she felt  improved for 2 weeks after completing abx- then sx started to set back in.   ROS: See pertinent positives and negatives per HPI.  Patient Active Problem List   Diagnosis Date Noted  . Hypertension   . Hyperlipidemia LDL goal <70   . Hot flashes 02/11/2020  . Depression, major, single episode, moderate ([place]) 01/11/2020  . Chronic constipation 01/11/2020  . COPD (chronic obstructive pulmonary disease) ([place]) 12/08/2019  . CAD (coronary artery disease), native coronary artery 12/08/2019  . Kidney stone 08/18/2019  . Irritable bowel syndrome 07/16/2019  . Chronic bilateral back pain 12/08/2018  . Vitamin D deficiency 12/08/2018  . Family history of premature CAD 09/08/2018  . Nicotine dependence 10/24/2011  . Allergic rhinitis 10/29/2007  . History of palpitations 04/08/2007  . Hypothyroidism 07/01/2006  . Anxiety 07/01/2006    Social History   Tobacco Use  . Smoking status: Current Every Day Smoker    Packs/day: 0.50    Types: Cigarettes  . Smokeless tobacco: Never Used  Substance Use Topics  . Alcohol use: No    Alcohol/week: 0.0 standard drinks    Current Outpatient Medications:  .  amLODipine (NORVASC) 5 MG tablet, Take 1 tablet (5 mg total) by mouth daily. (Patient taking differently: Take 5 mg by mouth at bedtime. Takes 2.5 mg daily), Disp: 90 tablet, Rfl: 3 .  aspirin EC 81 MG tablet, Take 81 mg by mouth daily. Swallow whole., Disp: , Rfl:  .  BIOTIN 5000 PO, Take by mouth., Disp: , Rfl:  .  buPROPion [facility]  XL) 300 MG 24 hr tablet, Take 1 tablet (300 mg total) by mouth daily., Disp: 90 tablet, Rfl: 1 .  cetirizine (ZYRTEC) 10 MG tablet, Take 1 tablet (10 mg total) by mouth daily., Disp: 90 tablet, Rfl: 4 .  Cholecalciferol (D3-1000 PO), Take by mouth., Disp: , Rfl:  .  cyanocobalamin 1000 MCG tablet, Take 1,000 mcg by mouth daily., Disp: , Rfl:  .  dextromethorphan-guaiFENesin (MUCINEX DM) 30-600 MG 12hr tablet, Take 1 tablet by mouth 2 (two) times daily., Disp: 60  tablet, Rfl: 1 .  ELDERBERRY PO, Take 1 tablet by mouth daily., Disp: , Rfl:  .  escitalopram (LEXAPRO) 5 MG tablet, Take 1 tablet (5 mg total) by mouth daily., Disp: 90 tablet, Rfl: 1 .  fluticasone (FLONASE) 50 MCG/ACT nasal spray, Place 2 sprays into both nostrils in the morning and at bedtime., Disp: 16 g, Rfl: 6 .  Fluticasone-Umeclidin-Vilant (TRELEGY ELLIPTA) 100-62.5-25 MCG/INH AEPB, Inhale 1 puff into the lungs daily., Disp: 60 each, Rfl: 5 .  levothyroxine (SYNTHROID) 75 MCG tablet, Take 1 tablet (75 mcg total) by mouth daily. Prior scripts- new provider, Disp: 90 tablet, Rfl: 3 .  metoprolol succinate (TOPROL-XL) 25 MG 24 hr tablet, Take 0.5-1 tablets (12.5-25 mg total) by mouth daily., Disp: 90 tablet, Rfl: 1 .  nitroGLYCERIN (NITROSTAT) 0.4 MG SL tablet, Place 1 tablet (0.4 mg total) under the tongue every 5 (five) minutes as needed for chest pain., Disp: 25 tablet, Rfl: 4 .  Probiotic Product (PROBIOTIC DAILY PO), Take by mouth daily as needed., Disp: , Rfl:  .  rosuvastatin (CRESTOR) 40 MG tablet, Take 1 tablet (40 mg total) by mouth daily., Disp: 90 tablet, Rfl: 3 .  tiZANidine (ZANAFLEX) 4 MG tablet, Take 4 mg by mouth 2 (two) times daily., Disp: , Rfl:  .  SUPREP BOWEL PREP KIT 17.5-3.13-1.6 GM/177ML SOLN, Take by mouth as directed. (Patient not taking: Reported on 11/20/2020), Disp: , Rfl:   Allergies  Allergen Reactions  . Citalopram Palpitations  . Ciprofloxacin Nausea And Vomiting  . Effexor [Venlafaxine]     nausea  . Sulfonamide Derivatives Rash    REACTION: unknown rx    OBJECTIVE: Temp (!) 97.1 F (36.2 C) (Oral)   LMP  (LMP Unknown)  Gen: No acute distress. Nontoxic in appearance.  HENT: AT. [place].  MMM.  Eyes:Pupils Equal Round Reactive to light, Extraocular movements intact,  Conjunctiva without redness, discharge or icterus. Chest: Cough present, no shortness of breath,  Skin: no rashes, purpura or petechiae.  Neuro:  Normal gait. Alert. Oriented x3  Psych:  Normal affect and demeanor. Normal speech. Normal thought content and judgment.  ASSESSMENT AND PLAN: Mary Escobar is a 55 y.o. female present for  COPD/Cough/headache Poss acute illness vs copd vs allergy causes. Currently without alarm symptoms.  She is still smoking daily.  - Novel Coronavirus, NAA ([facility]) - POCT Influenza A/B - start flonase- 2 sprays BID Start zyrtec qhs Continue nasal saline daily Continue mucinex dm Continue trelegy ellipta and albuterol prn Will await test results before initiating treatment. Likely viral or allergies. Could consider azith- would not encouraged another round of steroid so soon.  May need pul referral for better control of COPD.   Howard Pouch, DO 11/20/2020     Orders Placed This Encounter  Procedures  . Novel Coronavirus, NAA ([facility])  . POCT Influenza A/B   Meds ordered this encounter  Medications  . cetirizine (ZYRTEC) 10 MG tablet    Sig: Take  1 tablet (10 mg total) by mouth daily.    Dispense:  90 tablet    Refill:  4  . fluticasone (FLONASE) 50 MCG/ACT nasal spray    Sig: Place 2 sprays into both nostrils in the morning and at bedtime.    Dispense:  16 g    Refill:  6  . dextromethorphan-guaiFENesin (MUCINEX DM) 30-600 MG 12hr tablet    Sig: Take 1 tablet by mouth 2 (two) times daily.    Dispense:  60 tablet    Refill:  1   Referral Orders  No referral(s) requested today

## 2020-11-20 NOTE — Telephone Encounter (Signed)
Please read the message below and assist pt with scheduling. Thanks

## 2020-11-21 ENCOUNTER — Telehealth: Payer: Self-pay | Admitting: Family Medicine

## 2020-11-21 LAB — NOVEL CORONAVIRUS, NAA: SARS-CoV-2, NAA: NOT DETECTED

## 2020-11-21 LAB — SARS-COV-2, NAA 2 DAY TAT

## 2020-11-21 MED ORDER — AZITHROMYCIN 250 MG PO TABS: ORAL_TABLET | ORAL | 0 refills | Status: DC

## 2020-11-21 NOTE — Telephone Encounter (Signed)
Please inform patient her Covid test is negative. I have called in a Z-Pak for her to start only if her symptoms are not improving or worsening.    She can return to work effective immediately. Please provide her with a work excuse since I saw that she had called in for an excuse but was awaiting Covid test else.  His symptoms continue to not improve or become recurrent would elect to send her to pulmonology for further evaluation of her COPD control.

## 2020-11-22 NOTE — Telephone Encounter (Signed)
Spoke with pt regarding labs and instructions. Pt states she is not improving. Recommended to pick up rx. Letter for work sent. Pt returned yesterday. b

## 2020-11-24 ENCOUNTER — Encounter: Payer: 59 | Admitting: Gastroenterology

## 2020-11-29 ENCOUNTER — Other Ambulatory Visit: Payer: Self-pay | Admitting: Medical

## 2020-12-19 ENCOUNTER — Other Ambulatory Visit: Payer: Self-pay

## 2020-12-20 ENCOUNTER — Encounter: Payer: Self-pay | Admitting: Family Medicine

## 2020-12-20 ENCOUNTER — Ambulatory Visit (INDEPENDENT_AMBULATORY_CARE_PROVIDER_SITE_OTHER): Payer: 59 | Admitting: Family Medicine

## 2020-12-20 VITALS — BP 119/76 | HR 55 | Temp 98.8°F | Ht 67.0 in | Wt 188.0 lb

## 2020-12-20 DIAGNOSIS — R591 Generalized enlarged lymph nodes: Secondary | ICD-10-CM

## 2020-12-20 DIAGNOSIS — R0602 Shortness of breath: Secondary | ICD-10-CM | POA: Diagnosis not present

## 2020-12-20 DIAGNOSIS — Z87898 Personal history of other specified conditions: Secondary | ICD-10-CM

## 2020-12-20 DIAGNOSIS — R42 Dizziness and giddiness: Secondary | ICD-10-CM

## 2020-12-20 DIAGNOSIS — R600 Localized edema: Secondary | ICD-10-CM | POA: Diagnosis not present

## 2020-12-20 DIAGNOSIS — G4459 Other complicated headache syndrome: Secondary | ICD-10-CM

## 2020-12-20 DIAGNOSIS — E039 Hypothyroidism, unspecified: Secondary | ICD-10-CM

## 2020-12-20 DIAGNOSIS — E785 Hyperlipidemia, unspecified: Secondary | ICD-10-CM

## 2020-12-20 DIAGNOSIS — E010 Iodine-deficiency related diffuse (endemic) goiter: Secondary | ICD-10-CM

## 2020-12-20 DIAGNOSIS — F321 Major depressive disorder, single episode, moderate: Secondary | ICD-10-CM

## 2020-12-20 DIAGNOSIS — R072 Precordial pain: Secondary | ICD-10-CM

## 2020-12-20 DIAGNOSIS — I25119 Atherosclerotic heart disease of native coronary artery with unspecified angina pectoris: Secondary | ICD-10-CM

## 2020-12-20 DIAGNOSIS — I10 Essential (primary) hypertension: Secondary | ICD-10-CM

## 2020-12-20 DIAGNOSIS — F419 Anxiety disorder, unspecified: Secondary | ICD-10-CM

## 2020-12-20 DIAGNOSIS — Z8249 Family history of ischemic heart disease and other diseases of the circulatory system: Secondary | ICD-10-CM

## 2020-12-20 DIAGNOSIS — E663 Overweight: Secondary | ICD-10-CM

## 2020-12-20 DIAGNOSIS — J439 Emphysema, unspecified: Secondary | ICD-10-CM

## 2020-12-20 LAB — COMPREHENSIVE METABOLIC PANEL
ALT: 13 U/L (ref 0–35)
AST: 13 U/L (ref 0–37)
Albumin: 4.3 g/dL (ref 3.5–5.2)
Alkaline Phosphatase: 53 U/L (ref 39–117)
BUN: 16 mg/dL (ref 6–23)
CO2: 29 mEq/L (ref 19–32)
Calcium: 9.2 mg/dL (ref 8.4–10.5)
Chloride: 106 mEq/L (ref 96–112)
Creatinine, Ser: 0.79 mg/dL (ref 0.40–1.20)
GFR: 84.02 mL/min (ref >60.00)
Glucose, Bld: 104 mg/dL — ABNORMAL HIGH (ref 70–99)
Potassium: 4.1 mEq/L (ref 3.5–5.1)
Sodium: 141 mEq/L (ref 135–145)
Total Bilirubin: 0.5 mg/dL (ref 0.2–1.2)
Total Protein: 6.7 g/dL (ref 6.0–8.3)

## 2020-12-20 LAB — CBC WITH DIFFERENTIAL/PLATELET
Basophils Absolute: 0 10*3/uL (ref 0.0–0.1)
Basophils Relative: 0.5 % (ref 0.0–3.0)
Eosinophils Absolute: 0.1 10*3/uL (ref 0.0–0.7)
Eosinophils Relative: 1.3 % (ref 0.0–5.0)
HCT: 43.2 % (ref 36.0–46.0)
Hemoglobin: 14.3 g/dL (ref 12.0–15.0)
Lymphocytes Relative: 32.5 % (ref 12.0–46.0)
Lymphs Abs: 2 10*3/uL (ref 0.7–4.0)
MCHC: 33.1 g/dL (ref 30.0–36.0)
MCV: 98.3 fl (ref 78.0–100.0)
Monocytes Absolute: 0.5 10*3/uL (ref 0.1–1.0)
Monocytes Relative: 7.4 % (ref 3.0–12.0)
Neutro Abs: 3.7 10*3/uL (ref 1.4–7.7)
Neutrophils Relative %: 58.3 % (ref 43.0–77.0)
Platelets: 228 10*3/uL (ref 150.0–400.0)
RBC: 4.39 Mil/uL (ref 3.87–5.11)
RDW: 14.5 % (ref 11.5–15.5)
WBC: 6.3 10*3/uL (ref 4.0–10.5)

## 2020-12-20 LAB — TSH: TSH: 3.03 u[IU]/mL (ref 0.35–4.50)

## 2020-12-20 LAB — BRAIN NATRIURETIC PEPTIDE: Pro B Natriuretic peptide (BNP): 57 pg/mL (ref 0.0–100.0)

## 2020-12-20 LAB — T3, FREE: T3, Free: 2.5 pg/mL (ref 2.3–4.2)

## 2020-12-20 LAB — T4, FREE: Free T4: 1.01 ng/dL (ref 0.60–1.60)

## 2020-12-20 NOTE — Progress Notes (Signed)
This visit occurred during the SARS-CoV-2 public health emergency.  Safety protocols were in place, including screening questions prior to the visit, additional usage of staff PPE, and extensive cleaning of exam room while observing appropriate contact time as indicated for disinfecting solutions.    Mary Escobar , November 21, 1965, 55 y.o., female MRN: [id number] Patient Care Team    Relationship Specialty Notifications Start End  Mary Hillock, DO PCP - General Family Medicine  06/09/20   Mary Perla, MD PCP - Cardiology Cardiology Admissions 12/08/19   Mary Frock, MD Consulting Physician Urology  06/12/20   [facility]    06/12/20   Kerry Dory, NP Nurse Practitioner Nurse Practitioner  06/12/20    Comment: [facility]    Chief Complaint  Patient presents with  . swelling    Pt c/o swelling on R>L neck, b/l leg swelling x 1 mo; pt also reports some dizziness and HA;      Subjective: Pt presents for an OV with multiple complaints. Patient reports she has noticed swelling in her neck.  The swelling seems to be more pronounced on the right upper neck but she has noticed it in the left neck as well.  She endorses bilateral leg swelling that is present even in the morning time.  These both have occurred over the last month persistently.  She also endorses dizziness and headaches that have been occurring daily.  Neck swelling: She reports a "knot "just under the right side of her chin that has been present for about 1 month.  She says the knot is not tender but she feels like it might be getting larger.  She has had 2 URIs over the last 6 weeks.  She is an everyday smoker.  She denies compression feelings of her neck or dysphagia.  Bilateral lower extremity swelling: Patient states that her legs have been swollen every day for the last month.  She reports it can be improved in the morning but it is still present in the morning.  She is on amlodipine  5 mg daily.  She does endorse dyspnea, and has a history of COPD.  She is established with cardiology for her history of CAD.  She has gained 14 pounds in 2 and half months.  Hypertension/HLD-LDL goal less than 70/palpitations/CAD/family history of premature CAD She has a significant medical history of nonobstructive CAD, coronary CTA 5/821 showed mild (25-49%) calcified plaque in the LAD, she was sent for [facility] which showed no hemodynamically significant stenosis.  Her calcium score was 156 placing her in the 96 percentile.  She has had a prior history of cardiac catheterization in 2009 after a abnormal stress treadmill test which revealed nonobstructive CAD-20% ostial LAD stenosis and 30% left circumferential disease. Pt reports compliance with metoprolol 12.5-25 mg daily and amlodipine 5 mg daily.. Blood pressures ranges at home within normal limits.Patient denies chest pain.  Pt is compliant with daily baby ASA. Pt is  prescribed statin. RF: Hypertension, CAD, family history of premature CAD, smoker, hyperlipidemia  Hypothyroidism, unspecified type Patient reports compliance with levothyroxine 75 mcg daily.    TSH 03/03/2020 was normal.  Anxiety/hot flashes Patient reports compliance with Wellbutrin 300 mg daily.  She had been temporarily tried on Effexor but unfortunately had side effects to medication and return back to using Wellbutrin.  She states symptoms are adequately controlled.   Pulmonary emphysema, unspecified emphysema type (HCC)/nicotine dependence She reports history of COPD and use  of Trelegy Ellipta.  She states her COPD been controlled on this medication but she has noticed an increase in her shortness of breath. She is prescribed albuterol and rarely uses.  She is still smoking at this time however she has cut back and is using Wellbutrin and her smoking cessation.   Depression screen [facility] 2/9 11/20/2020 06/23/2020 06/09/2020 01/11/2020 12/08/2018  Decreased Interest 0 0 0 0 0   Down, Depressed, Hopeless 0 2 0 3 3  PHQ - 2 Score 0 2 0 3 3  Altered sleeping 0 3 0 3 3  Tired, decreased energy 0 3 0 3 3  Change in appetite 0 1 0 0 0  Feeling bad or failure about yourself  0 0 0 0 0  Trouble concentrating 0 0 0 0 0  Moving slowly or fidgety/restless 0 0 0 0 0  Suicidal thoughts 0 0 0 0 0  PHQ-9 Score 0 9 0 9 9  Difficult doing work/chores - - Not difficult at all Not difficult at all -    Allergies  Allergen Reactions  . Citalopram Palpitations  . Ciprofloxacin Nausea And Vomiting  . Effexor [Venlafaxine]     nausea  . Sulfonamide Derivatives Rash    REACTION: unknown rx   Social History   Social History Narrative   Marital status/children/pets: Single, 1 child.    education/employment: 12th grade education, employed as a Biomedical engineer.   Safety:      -smoke alarm in the home:Yes     - wears seatbelt: Yes     - Feels safe in their relationships: Yes   Past Medical History:  Diagnosis Date  . Anxiety   . Arthritis   . COPD (chronic obstructive pulmonary disease) ([place])   . DOE (dyspnea on exertion) 01/11/2020  . Fainting spell   . Family history of premature CAD   . Fatigue 2020   multifactoral  . GERD (gastroesophageal reflux disease)   . History of kidney stones   . Hyperlipidemia   . Hypertension   . Hypothyroid   . IBS (irritable bowel syndrome)   . mild nonobstructive CAD on cath 2009   . Palpitation   . Polyarthralgia 12/08/2018  . Smoker   . Snoring 09/08/2018  . Vitamin D deficiency    Past Surgical History:  Procedure Laterality Date  . CORONARY ANGIOGRAM  2009   mild CAD noted after abn GXT  . CYSTOSCOPY W/ URETERAL STENT PLACEMENT Right 03/31/2020   Procedure: CYSTOSCOPY WITH STENT PLACEMENT;  Surgeon: Mary Frock, MD;  Location: [facility];  Service: Urology;  Laterality: Right;  . CYSTOSCOPY WITH RETROGRADE PYELOGRAM, URETEROSCOPY AND STENT PLACEMENT Right 04/21/2020   Procedure: CYSTOSCOPY WITH RETROGRADE PYELOGRAM,  RIGHT URETEROSCOPY;  Surgeon: Mary Frock, MD;  Location: [facility];  Service: Urology;  Laterality: Right;  1 HR  . HOLMIUM LASER APPLICATION Right 3/97/6734   Procedure: HOLMIUM LASER APPLICATION;  Surgeon: Mary Frock, MD;  Location: [facility];  Service: Urology;  Laterality: Right;  . WRIST SURGERY     Family History  Problem Relation Age of Onset  . CAD Father        MI at age 67  . Heart disease Father        CABG x 3  . Sleep apnea Father   . Arrhythmia Father        pacemaker  . COPD Father   . Diabetes Father   . Thyroid disease Father   . Hyperlipidemia Father   .  Arthritis Father   . Hypertension Father   . Heart attack Father   . Colon polyps Father   . Diabetes Mother   . Other Mother        carcinoid tumor  . Hyperlipidemia Mother   . Arthritis Mother   . Hypertension Mother   . Colon cancer Mother   . Crohn's disease Sister   . Colon cancer Maternal Aunt   . Arthritis Maternal Grandmother   . Diabetes Maternal Grandmother   . Heart attack Maternal Grandmother   . Heart disease Maternal Grandmother   . Arthritis Maternal Grandfather   . Heart attack Maternal Grandfather 47       died 26 of MI  . Arthritis Paternal Grandmother   . Stroke Paternal Grandmother   . Arthritis Paternal Grandfather   . Heart attack Paternal Grandfather 42       Died of MI  . Healthy Daughter   . Esophageal cancer Neg Hx   . Stomach cancer Neg Hx   . Rectal cancer Neg Hx    Allergies as of 12/20/2020      Reactions   Citalopram Palpitations   Ciprofloxacin Nausea And Vomiting   Effexor [venlafaxine]    nausea   Sulfonamide Derivatives Rash   REACTION: unknown rx      Medication List       Accurate as of December 20, 2020 11:59 PM. If you have any questions, ask your nurse or doctor.        STOP taking these medications   azithromycin 250 MG tablet Commonly known as: ZITHROMAX Stopped by: Howard Pouch, DO   dextromethorphan-guaiFENesin 30-600 MG 12hr  tablet Commonly known as: MUCINEX DM Stopped by: Howard Pouch, DO   PROBIOTIC DAILY PO Stopped by: Howard Pouch, DO   Suprep Bowel Prep Kit 17.5-3.13-1.6 GM/177ML Soln Generic drug: Na Sulfate-K Sulfate-Mg Sulf Stopped by: Howard Pouch, DO     TAKE these medications   amLODipine 5 MG tablet Commonly known as: NORVASC Take 1 tablet (5 mg total) by mouth at bedtime. What changed:   when to take this  Another medication with the same name was removed. Continue taking this medication, and follow the directions you see here. Changed by: Howard Pouch, DO   aspirin EC 81 MG tablet Take 81 mg by mouth daily. Swallow whole.   BIOTIN 5000 PO Take by mouth.   buPROPion 300 MG 24 hr tablet Commonly known as: WELLBUTRIN XL Take 1 tablet (300 mg total) by mouth daily.   cetirizine 10 MG tablet Commonly known as: ZYRTEC Take 1 tablet (10 mg total) by mouth daily.   cyanocobalamin 1000 MCG tablet Take 1,000 mcg by mouth daily.   D3-1000 PO Take by mouth.   ELDERBERRY PO Take 1 tablet by mouth daily.   escitalopram 5 MG tablet Commonly known as: LEXAPRO Take 1 tablet (5 mg total) by mouth daily.   fluticasone 50 MCG/ACT nasal spray Commonly known as: FLONASE Place 2 sprays into both nostrils in the morning and at bedtime.   furosemide 20 MG tablet Commonly known as: LASIX Take 0.5-1 tablets (10-20 mg total) by mouth daily. Started by: Howard Pouch, DO   levothyroxine 75 MCG tablet Commonly known as: Synthroid Take 1 tablet (75 mcg total) by mouth daily. Prior scripts- new provider   metoprolol succinate 25 MG 24 hr tablet Commonly known as: TOPROL-XL Take 0.5-1 tablets (12.5-25 mg total) by mouth daily.   nitroGLYCERIN 0.4 MG SL tablet Commonly known as:  NITROSTAT Place 1 tablet (0.4 mg total) under the tongue every 5 (five) minutes as needed for chest pain.   omeprazole 10 MG capsule Commonly known as: PRILOSEC Take 10 mg by mouth daily.   potassium chloride  10 MEQ tablet Commonly known as: KLOR-CON Take 1 tablet (10 mEq total) by mouth daily. Started by: Howard Pouch, DO   rosuvastatin 40 MG tablet Commonly known as: CRESTOR Take 1 tablet (40 mg total) by mouth daily.   tiZANidine 4 MG tablet Commonly known as: ZANAFLEX Take 4 mg by mouth as needed.   Trelegy Ellipta 100-62.5-25 MCG/INH Aepb Generic drug: Fluticasone-Umeclidin-Vilant Inhale 1 puff into the lungs daily.       All past medical history, surgical history, allergies, family history, immunizations andmedications were updated in the EMR today and reviewed under the history and medication portions of their EMR.     ROS: Negative, with the exception of above mentioned in HPI   Objective:  BP 119/76   Pulse (!) 55   Temp 98.8 F (37.1 C) (Oral)   Ht 5' 7"  (1.702 m)   Wt 188 lb (85.3 kg)   LMP  (LMP Unknown)   SpO2 97%   BMI 29.44 kg/m  Body mass index is 29.44 kg/m. Gen: Afebrile. No acute distress. Nontoxic in appearance, well developed, well nourished.  Mildly overweight female. HENT: AT. [place]. MMM, no oral lesions. Bilateral nares without erythema, drainage or swelling. Throat without erythema or exudates.  No cough.  No hoarseness. Eyes:Pupils Equal Round Reactive to light, Extraocular movements intact,  Conjunctiva without redness, discharge or icterus. Neck/lymp/endocrine: Supple, right submandibular lymph node approximately 1 cm palpated, nontender and mobile.  Possible very mild thyromegaly, no discrete nodules palpated. CV: RRR no murmur, trace/+1 bilateral lower edema Chest: CTAB, no wheeze or crackles. Good air movement, normal resp effort.  Skin: No rashes, purpura or petechiae.  Neuro:  Normal gait. Escobar. EOMi. Alert. Oriented x3 Psych: Normal affect, dress and demeanor. Normal speech. Normal thought content and judgment.  No exam data present No results found.  Assessment/Plan: Mary Escobar is a 55 y.o. female present for OV for CMC/acute Hot  flashes/Anxiety/Depression, major, single episode, moderate (HCC) Stable.  Continue Wellbutrin 300 mg daily  COPD//headache Stable She is still smoking daily.   Encouraged her to quit smoking. Continue zyrtec qhs Continue nasal saline daily Continue trelegy ellipta and albuterol prn Consider referral to pulmonology if symptoms are improved or cardiology work-up is negative for cause of dyspnea. Consider sleep study  Hypertension/HLD-LDL goal less than 70/palpitations/CAD/family history of premature CAD Stable. Continue metoprolol 1/2-1 tab daily.  Continue amlodipine 5 mg daily -possibly be part of her lower extremity swelling. Nitro as needed prescribed by cardiology Continue Crestor 40 mg daily prescribed by cardiology Continue daily baby aspirin Continue follow-up with cardiology  Hypothyroidism, unspecified type Stable however will recheck today given her current constellation of symptoms to rule out as potential cause. Thyroid possibly mildly enlarged on exam.  Patient reports she has had thyroid ultrasounds in the past which were normal. Continue levothyroxine 75 mcg daily  Anxiety/hot flashes/smoking cessation Stable Continue Wellbutrin 300 mg daily.  Lower extremity edema/dyspnea -With her history of CAD, 14 pound weight gain and family history, elected to move forward with echocardiogram and low-dose Lasix. -Potassium supplement use with Lasix prescribed. Low-sodium diet encouraged Close follow-up will be arranged  Headache-dizziness: -Carotid duplex studies ordered. - she is compliant with baby aspirin daily and statin  Adenopathy: -Soft tissue neck  ultrasound ordered for evaluation on palpable lymph node right submandibular area which has been present greater than 1 month and patient feels is enlarging.  We did discuss reactive lymph nodes, benign lymph nodes and lymph nodes that are concerning for malignancy today. She is an everyday smoker. She has had 2  URIs over the last few months.   Reviewed expectations re: course of current medical issues.  Discussed self-management of symptoms.  Outlined signs and symptoms indicating need for more acute intervention.  Patient verbalized understanding and all questions were answered.  Patient received an After-Visit Summary.    Orders Placed This Encounter  Procedures  . US Carotid Duplex Bilateral  . US Soft Tissue Head/Neck (NON-THYROID)  . TSH  . CBC w/Diff  . T3, free  . T4, free  . B Nat Peptide  . Comp Met (CMET)  . ECHOCARDIOGRAM COMPLETE   Meds ordered this encounter  Medications  . furosemide (LASIX) 20 MG tablet    Sig: Take 0.5-1 tablets (10-20 mg total) by mouth daily.    Dispense:  30 tablet    Refill:  5  . potassium chloride SA (KLOR-CON) 10 MEQ tablet    Sig: Take 1 tablet (10 mEq total) by mouth daily.    Dispense:  30 tablet    Refill:  5  . rosuvastatin (CRESTOR) 40 MG tablet    Sig: Take 1 tablet (40 mg total) by mouth daily.    Dispense:  90 tablet    Refill:  3  . metoprolol succinate (TOPROL-XL) 25 MG 24 hr tablet    Sig: Take 0.5-1 tablets (12.5-25 mg total) by mouth daily.    Dispense:  90 tablet    Refill:  1  . levothyroxine (SYNTHROID) 75 MCG tablet    Sig: Take 1 tablet (75 mcg total) by mouth daily. Prior scripts- new provider    Dispense:  90 tablet    Refill:  3  . Fluticasone-Umeclidin-Vilant (TRELEGY ELLIPTA) 100-62.5-25 MCG/INH AEPB    Sig: Inhale 1 puff into the lungs daily.    Dispense:  60 each    Refill:  5  . fluticasone (FLONASE) 50 MCG/ACT nasal spray    Sig: Place 2 sprays into both nostrils in the morning and at bedtime.    Dispense:  16 g    Refill:  6  . escitalopram (LEXAPRO) 5 MG tablet    Sig: Take 1 tablet (5 mg total) by mouth daily.    Dispense:  90 tablet    Refill:  1  . buPROPion (WELLBUTRIN XL) 300 MG 24 hr tablet    Sig: Take 1 tablet (300 mg total) by mouth daily.    Dispense:  90 tablet    Refill:  1  .  amLODipine (NORVASC) 5 MG tablet    Sig: Take 1 tablet (5 mg total) by mouth at bedtime.    Dispense:  90 tablet    Refill:  1   Referral Orders  No referral(s) requested today   > 45 Minutes was dedicated to this patient's encounter to include pre-visit review of chart, face-to-face time with patient and post-visit work- which include documentation and prescribing medications and/or ordering test when necessary.     Note is dictated utilizing voice recognition software. Although note has been proof read prior to signing, occasional typographical errors still can be missed. If any questions arise, please do not hesitate to call for verification.   electronically signed by:  Howard Pouch, DO  [facility] [place]

## 2020-12-21 ENCOUNTER — Encounter: Payer: Self-pay | Admitting: Family Medicine

## 2020-12-21 ENCOUNTER — Telehealth: Payer: Self-pay | Admitting: Family Medicine

## 2020-12-21 DIAGNOSIS — G4459 Other complicated headache syndrome: Secondary | ICD-10-CM

## 2020-12-21 DIAGNOSIS — R42 Dizziness and giddiness: Secondary | ICD-10-CM | POA: Insufficient documentation

## 2020-12-21 DIAGNOSIS — R591 Generalized enlarged lymph nodes: Secondary | ICD-10-CM | POA: Insufficient documentation

## 2020-12-21 DIAGNOSIS — E010 Iodine-deficiency related diffuse (endemic) goiter: Secondary | ICD-10-CM | POA: Insufficient documentation

## 2020-12-21 DIAGNOSIS — R600 Localized edema: Secondary | ICD-10-CM | POA: Insufficient documentation

## 2020-12-21 DIAGNOSIS — E663 Overweight: Secondary | ICD-10-CM | POA: Insufficient documentation

## 2020-12-21 HISTORY — DX: Other complicated headache syndrome: G44.59

## 2020-12-21 MED ORDER — METOPROLOL SUCCINATE ER 25 MG PO TB24: 12.5000 mg | ORAL_TABLET | Freq: Every day | ORAL | 1 refills | Status: DC

## 2020-12-21 MED ORDER — TRELEGY ELLIPTA 100-62.5-25 MCG/INH IN AEPB: 1.0000 | INHALATION_SPRAY | Freq: Every day | RESPIRATORY_TRACT | 5 refills | Status: DC

## 2020-12-21 MED ORDER — BUPROPION HCL ER (XL) 300 MG PO TB24: 300.0000 mg | ORAL_TABLET | Freq: Every day | ORAL | 1 refills | Status: DC

## 2020-12-21 MED ORDER — LEVOTHYROXINE SODIUM 75 MCG PO TABS: 75.0000 ug | ORAL_TABLET | Freq: Every day | ORAL | 3 refills | Status: DC

## 2020-12-21 MED ORDER — AMLODIPINE BESYLATE 5 MG PO TABS: 5.0000 mg | ORAL_TABLET | Freq: Every day | ORAL | 1 refills | Status: DC

## 2020-12-21 MED ORDER — FLUTICASONE PROPIONATE 50 MCG/ACT NA SUSP: 2.0000 | Freq: Two times a day (BID) | NASAL | 6 refills | Status: DC

## 2020-12-21 MED ORDER — FUROSEMIDE 20 MG PO TABS: 10.0000 mg | ORAL_TABLET | Freq: Every day | ORAL | 5 refills | Status: DC

## 2020-12-21 MED ORDER — ESCITALOPRAM OXALATE 5 MG PO TABS: 5.0000 mg | ORAL_TABLET | Freq: Every day | ORAL | 1 refills | Status: DC

## 2020-12-21 MED ORDER — POTASSIUM CHLORIDE CRYS ER 10 MEQ PO TBCR: 10.0000 meq | EXTENDED_RELEASE_TABLET | Freq: Every day | ORAL | 5 refills | Status: DC

## 2020-12-21 MED ORDER — ROSUVASTATIN CALCIUM 40 MG PO TABS: 40.0000 mg | ORAL_TABLET | Freq: Every day | ORAL | 3 refills | Status: DC

## 2020-12-21 NOTE — Telephone Encounter (Signed)
Spoke with pt regarding labs and instructions.   

## 2020-12-21 NOTE — Progress Notes (Signed)
HPI: Fu palpitations. Cardiac catheterization was performed in 2009 following an abnormal exercise treadmill. She had mild nonobstructive coronary disease. Ejection fraction 65%. Normal right-sided pressures. She did have a brief run of SVT during a right heart catheterization.  Monitor October 2018 showed sinus bradycardia to sinus tachycardia.  Cardiac CTA May 2021 showed calcium score 156, mild (25 to 49%) LAD.  FFR was negative.  There was also note of 4 cm ectasia of ascending thoracic aorta.  Since last seen, she has dyspnea with more vigorous activities but not routine activities.  No orthopnea, PND, pedal edema, exertional chest pain or syncope.  She occasionally has palpitations.  Current Outpatient Medications  Medication Sig Dispense Refill  . amLODipine (NORVASC) 5 MG tablet Take 1 tablet (5 mg total) by mouth at bedtime. 90 tablet 1  . aspirin EC 81 MG tablet Take 81 mg by mouth daily. Swallow whole.    Marland Kitchen BIOTIN 5000 PO Take by mouth.    Marland Kitchen buPROPion (WELLBUTRIN XL) 300 MG 24 hr tablet Take 1 tablet (300 mg total) by mouth daily. 90 tablet 1  . cetirizine (ZYRTEC) 10 MG tablet Take 1 tablet (10 mg total) by mouth daily. 90 tablet 4  . Cholecalciferol (D3-1000 PO) Take by mouth.    . cyanocobalamin 1000 MCG tablet Take 1,000 mcg by mouth daily.    Marland Kitchen ELDERBERRY PO Take 1 tablet by mouth daily.    Marland Kitchen escitalopram (LEXAPRO) 5 MG tablet Take 1 tablet (5 mg total) by mouth daily. 90 tablet 1  . fluticasone (FLONASE) 50 MCG/ACT nasal spray Place 2 sprays into both nostrils in the morning and at bedtime. 16 g 6  . Fluticasone-Umeclidin-Vilant (TRELEGY ELLIPTA) 100-62.5-25 MCG/INH AEPB Inhale 1 puff into the lungs daily. 60 each 5  . furosemide (LASIX) 20 MG tablet Take 0.5-1 tablets (10-20 mg total) by mouth daily. 30 tablet 5  . levothyroxine (SYNTHROID) 75 MCG tablet Take 1 tablet (75 mcg total) by mouth daily. Prior scripts- new provider 90 tablet 3  . metoprolol succinate  (TOPROL-XL) 25 MG 24 hr tablet Take 0.5-1 tablets (12.5-25 mg total) by mouth daily. 90 tablet 1  . omeprazole (PRILOSEC) 10 MG capsule Take 10 mg by mouth daily.    . potassium chloride SA (KLOR-CON) 10 MEQ tablet Take 1 tablet (10 mEq total) by mouth daily. 30 tablet 5  . rosuvastatin (CRESTOR) 40 MG tablet Take 1 tablet (40 mg total) by mouth daily. 90 tablet 3  . nitroGLYCERIN (NITROSTAT) 0.4 MG SL tablet Place 1 tablet (0.4 mg total) under the tongue every 5 (five) minutes as needed for chest pain. 25 tablet 4   No current facility-administered medications for this visit.     Past Medical History:  Diagnosis Date  . Anxiety   . Arthritis   . COPD (chronic obstructive pulmonary disease) ([place])   . DOE (dyspnea on exertion) 01/11/2020  . Fainting spell   . Family history of premature CAD   . Fatigue 2020   multifactoral  . GERD (gastroesophageal reflux disease)   . History of kidney stones   . Hyperlipidemia   . Hypertension   . Hypothyroid   . IBS (irritable bowel syndrome)   . mild nonobstructive CAD on cath 2009   . Palpitation   . Polyarthralgia 12/08/2018  . Smoker   . Snoring 09/08/2018  . Vitamin D deficiency     Past Surgical History:  Procedure Laterality Date  . CORONARY ANGIOGRAM  2009  mild CAD noted after abn GXT  . CYSTOSCOPY W/ URETERAL STENT PLACEMENT Right 03/31/2020   Procedure: CYSTOSCOPY WITH STENT PLACEMENT;  Surgeon: Alexis Frock, MD;  Location: [facility];  Service: Urology;  Laterality: Right;  . CYSTOSCOPY WITH RETROGRADE PYELOGRAM, URETEROSCOPY AND STENT PLACEMENT Right 04/21/2020   Procedure: CYSTOSCOPY WITH RETROGRADE PYELOGRAM, RIGHT URETEROSCOPY;  Surgeon: Alexis Frock, MD;  Location: [facility];  Service: Urology;  Laterality: Right;  1 HR  . HOLMIUM LASER APPLICATION Right 05/03/9146   Procedure: HOLMIUM LASER APPLICATION;  Surgeon: Alexis Frock, MD;  Location: [facility];  Service: Urology;  Laterality: Right;  . WRIST SURGERY      Social  History   Socioeconomic History  . Marital status: Divorced    Spouse name: Not on file  . Number of children: 1  . Years of education: 15  . Highest education level: Not on file  Occupational History  . Not on file  Tobacco Use  . Smoking status: Current Every Day Smoker    Packs/day: 0.50    Types: Cigarettes  . Smokeless tobacco: Never Used  Vaping Use  . Vaping Use: Never used  Substance and Sexual Activity  . Alcohol use: No    Alcohol/week: 0.0 standard drinks  . Drug use: No  . Sexual activity: Not Currently    Partners: Male    Birth control/protection: Post-menopausal  Other Topics Concern  . Not on file  Social History Narrative   Marital status/children/pets: Single, 1 child.    education/employment: 12th grade education, employed as a Biomedical engineer.   Safety:      -smoke alarm in the home:Yes     - wears seatbelt: Yes     - Feels safe in their relationships: Yes   Social Determinants of Health   Financial Resource Strain: Not on file  Food Insecurity: Not on file  Transportation Needs: Not on file  Physical Activity: Not on file  Stress: Not on file  Social Connections: Not on file  Intimate Partner Violence: Not on file    Family History  Problem Relation Age of Onset  . CAD Father        MI at age 65  . Heart disease Father        CABG x 3  . Sleep apnea Father   . Arrhythmia Father        pacemaker  . COPD Father   . Diabetes Father   . Thyroid disease Father   . Hyperlipidemia Father   . Arthritis Father   . Hypertension Father   . Heart attack Father   . Colon polyps Father   . Diabetes Mother   . Other Mother        carcinoid tumor  . Hyperlipidemia Mother   . Arthritis Mother   . Hypertension Mother   . Colon cancer Mother   . Crohn's disease Sister   . Colon cancer Maternal Aunt   . Arthritis Maternal Grandmother   . Diabetes Maternal Grandmother   . Heart attack Maternal Grandmother   . Heart disease Maternal  Grandmother   . Arthritis Maternal Grandfather   . Heart attack Maternal Grandfather 41       died 25 of MI  . Arthritis Paternal Grandmother   . Stroke Paternal Grandmother   . Arthritis Paternal Grandfather   . Heart attack Paternal Grandfather 15       Died of MI  . Healthy Daughter   . Esophageal cancer Neg Hx   .  Stomach cancer Neg Hx   . Rectal cancer Neg Hx     ROS: Arthralgias but no fevers or chills, productive cough, hemoptysis, dysphasia, odynophagia, melena, hematochezia, dysuria, hematuria, rash, seizure activity, orthopnea, PND, pedal edema, claudication. Remaining systems are negative.  Physical Exam: Well-developed well-nourished in no acute distress.  Skin is warm and dry.  HEENT is normal.  Neck is supple.  Chest is clear to auscultation with normal expansion.  Cardiovascular exam is regular rate and rhythm.  Abdominal exam nontender or distended. No masses palpated. Extremities show no edema. neuro grossly intact  ECG-sinus bradycardia at a rate of 55, no ST changes.  Personally reviewed  A/P  1 coronary artery disease-nonobstructive on CTA.  Continue aspirin and statin.  2 hyperlipidemia-continue statin.   3 chest pain-No recurrences; most recent CTA showed nonobstructive coronary disease.   4 tobacco abuse-patient counseled on discontinuing.  5 palpitations-continue beta-blocker.  6 ectasia of ascending thoracic aorta-plan follow-up CTA May 2022.  Kirk Ruths, MD

## 2020-12-21 NOTE — Telephone Encounter (Signed)
Please call patient Liver, kidney and thyroid function are normal Blood cell counts and electrolytes are normal BNP-cardiac/fluid indicator- is in normal range. I have placed the orders for          - imaging of her carotid arteries in her neck for the dizziness and headache complaints.     -ultrasound of her neck for the swelling and lymph node appreciated      -Echocardiogram to evaluate the function of her heart with her reports of continued shortness of breath and new lower extremity edema.  Lastly, I have called in a medication called Lasix.  This is a diuretic/water pill.  She is to take half tab in the morning with the potassium supplement, also called in.   Follow-up in 3-4 weeks so that we can review all results and reevaluate.

## 2020-12-25 ENCOUNTER — Telehealth: Payer: Self-pay | Admitting: Cardiology

## 2020-12-25 NOTE — Telephone Encounter (Signed)
Spoke with patient and her PCP ordered Echo which is scheduled 5/6, visit with Dr Stanford Breed this week Per patient Echo ordered secondary to LE edema Advised patient to keep ov with Dr Stanford Breed as scheduled, next available 6/21

## 2020-12-25 NOTE — Telephone Encounter (Signed)
New Message:      Pt says she have an appt on Thursday(12-28-20) with Dr Stanford Breed. Her family doctor have her scheduled for an Echo on 01-26-21. Her question is, should she see Dr Stanford Breed after the Echo or just keep her appt on Thursday?

## 2020-12-26 ENCOUNTER — Ambulatory Visit: Payer: 59 | Admitting: Cardiology

## 2020-12-28 ENCOUNTER — Other Ambulatory Visit: Payer: Self-pay

## 2020-12-28 ENCOUNTER — Ambulatory Visit (INDEPENDENT_AMBULATORY_CARE_PROVIDER_SITE_OTHER): Payer: 59 | Admitting: Cardiology

## 2020-12-28 ENCOUNTER — Encounter: Payer: Self-pay | Admitting: Cardiology

## 2020-12-28 VITALS — BP 122/78 | HR 55 | Ht 67.0 in | Wt 196.2 lb

## 2020-12-28 DIAGNOSIS — I712 Thoracic aortic aneurysm, without rupture, unspecified: Secondary | ICD-10-CM

## 2020-12-28 DIAGNOSIS — E785 Hyperlipidemia, unspecified: Secondary | ICD-10-CM

## 2020-12-28 DIAGNOSIS — I25119 Atherosclerotic heart disease of native coronary artery with unspecified angina pectoris: Secondary | ICD-10-CM

## 2020-12-28 DIAGNOSIS — Z87898 Personal history of other specified conditions: Secondary | ICD-10-CM | POA: Diagnosis not present

## 2020-12-28 DIAGNOSIS — Z720 Tobacco use: Secondary | ICD-10-CM

## 2020-12-28 NOTE — Patient Instructions (Signed)
  Testing/Procedures:  CTA OF THE CHEST TO FOLLOW UP ON THORACIC ANEURYSM AT [facility]=[address]   Follow-Up: At [facility], you and your health needs are our priority.  As part of our continuing mission to provide you with exceptional heart care, we have created designated Provider Care Teams.  These Care Teams include your primary Cardiologist (physician) and Advanced Practice Providers (APPs -  Physician Assistants and Nurse Practitioners) who all work together to provide you with the care you need, when you need it.  We recommend signing up for the patient portal called "MyChart".  Sign up information is provided on this After Visit Summary.  MyChart is used to connect with patients for Virtual Visits (Telemedicine).  Patients are able to view lab/test results, encounter notes, upcoming appointments, etc.  Non-urgent messages can be sent to your provider as well.   To learn more about what you can do with MyChart, go to [email or web address].    Your next appointment:   12 month(s)  The format for your next appointment:   In Person  Provider:   Kirk Ruths, MD

## 2021-01-02 ENCOUNTER — Other Ambulatory Visit: Payer: Self-pay | Admitting: Family Medicine

## 2021-01-04 ENCOUNTER — Ambulatory Visit
Admission: RE | Admit: 2021-01-04 | Discharge: 2021-01-04 | Disposition: A | Discharge status: Home / Self Care | Payer: 59 | Source: Ambulatory Visit | Attending: Family Medicine | Admitting: Family Medicine

## 2021-01-04 DIAGNOSIS — G4459 Other complicated headache syndrome: Secondary | ICD-10-CM

## 2021-01-04 DIAGNOSIS — R591 Generalized enlarged lymph nodes: Secondary | ICD-10-CM

## 2021-01-04 DIAGNOSIS — R42 Dizziness and giddiness: Secondary | ICD-10-CM

## 2021-01-04 DIAGNOSIS — I25119 Atherosclerotic heart disease of native coronary artery with unspecified angina pectoris: Secondary | ICD-10-CM

## 2021-01-04 IMAGING — US US CAROTID DUPLEX BILAT
1 series · 13 of 24 positions shown · non-contrast
Comparison: None.

CLINICAL DATA: Dizziness, headache, coronary disease

EXAM:
BILATERAL CAROTID DUPLEX ULTRASOUND
TECHNIQUE: Gray scale imaging, color Doppler and duplex ultrasound were
performed of bilateral carotid and vertebral arteries in the neck.

[Series 1: us carotid duplex bilat · 0.06mm/px · 13 of 66 slices shown]
[im 1/66]
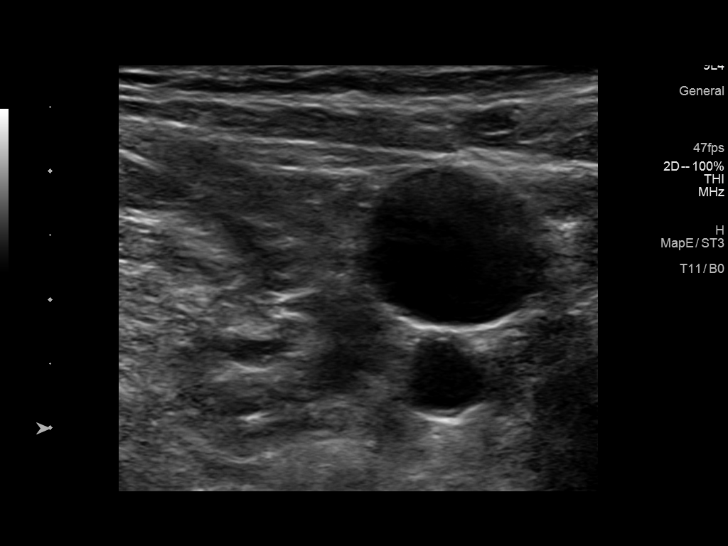
[im 6/66]
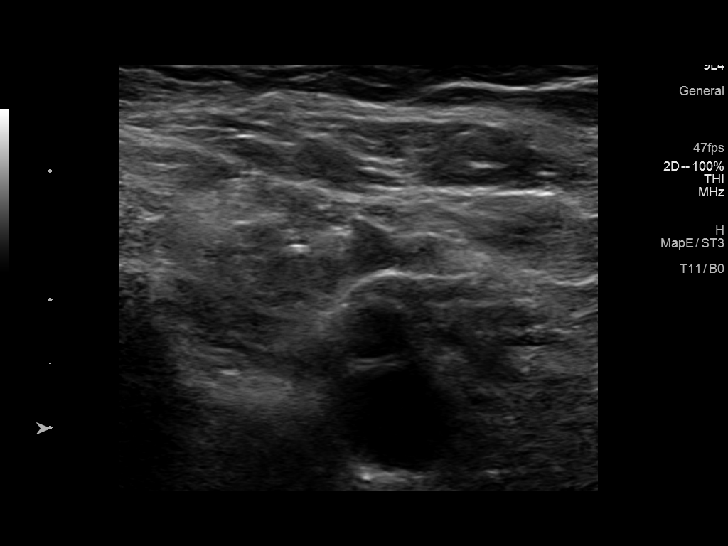
[im 12/66]
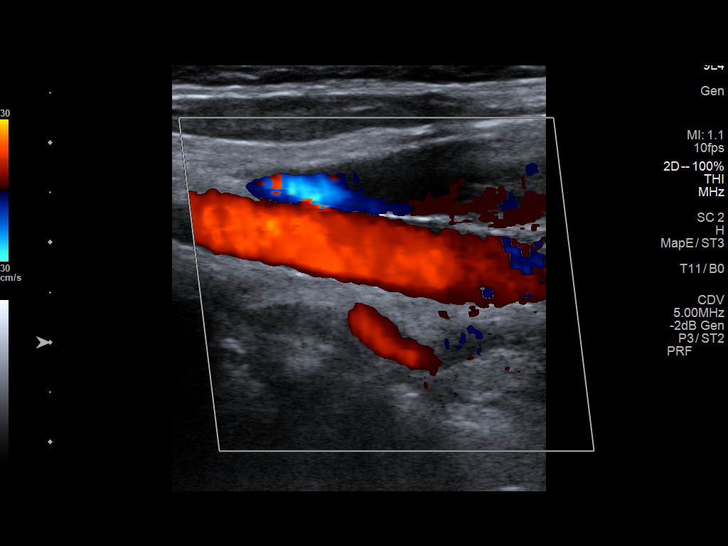
[im 17/66]
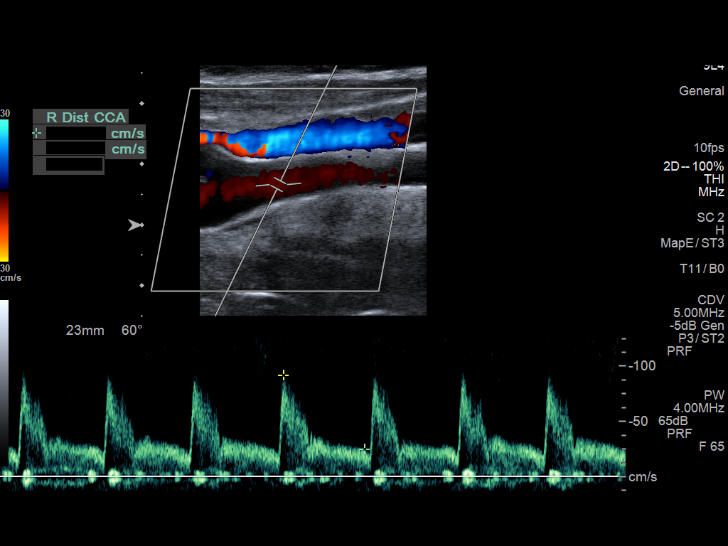
[im 23/66]
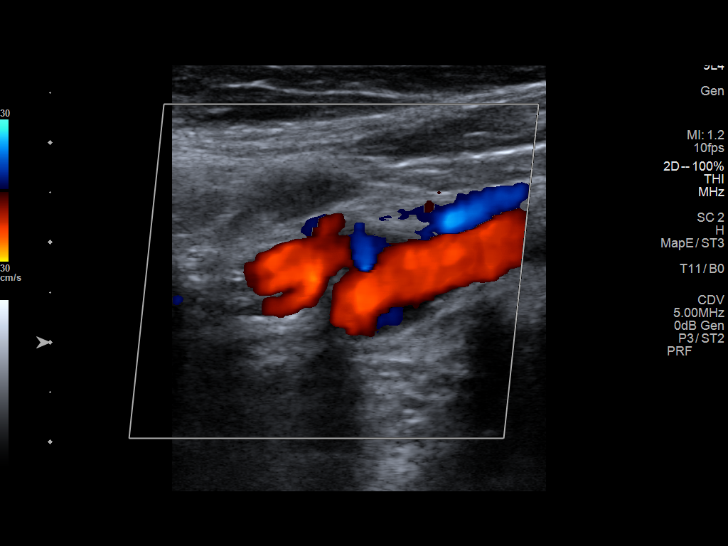
[im 29/66]
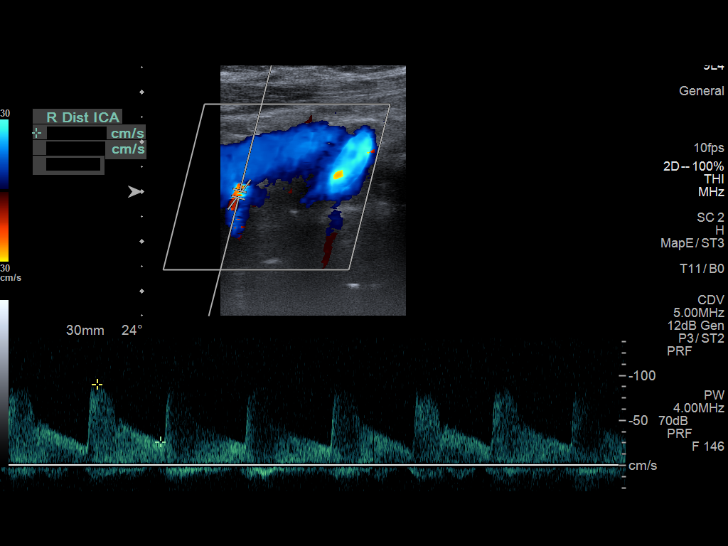
[im 34/66]
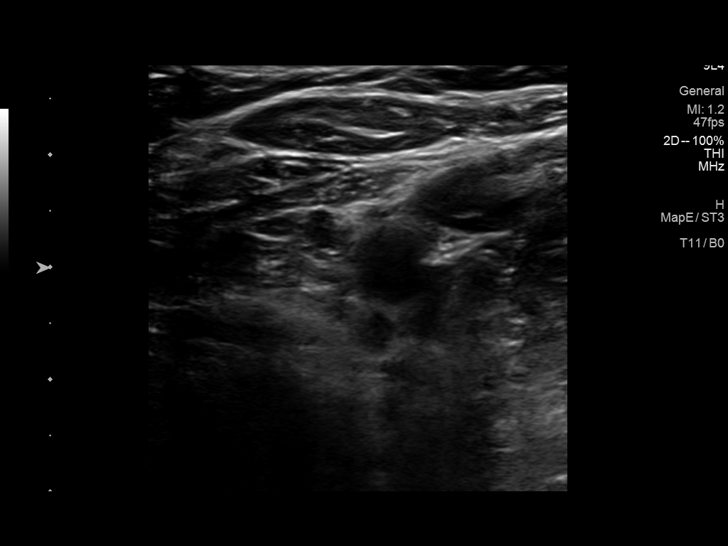
[im 37/66]
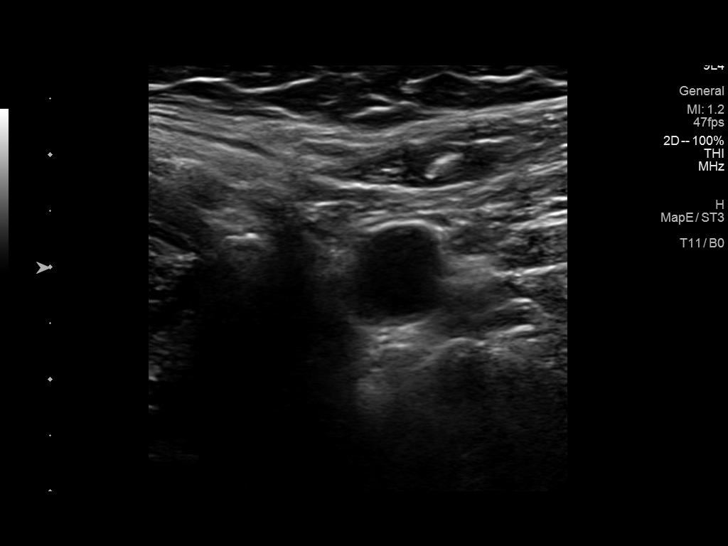
[im 43/66]
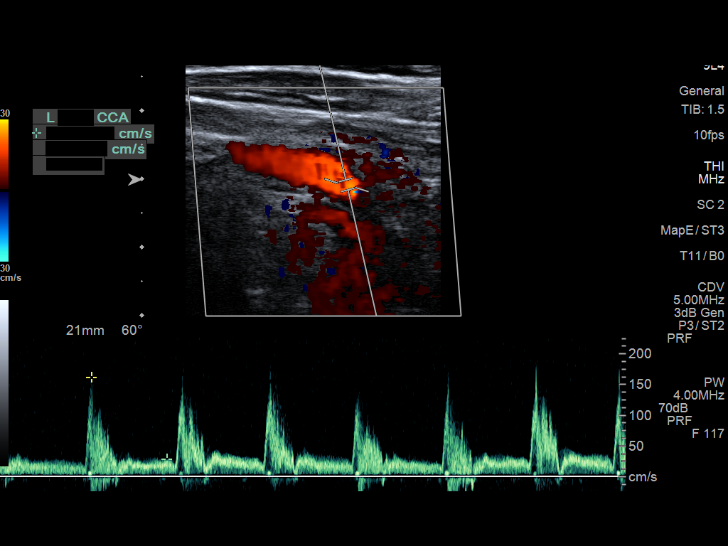
[im 49/66]
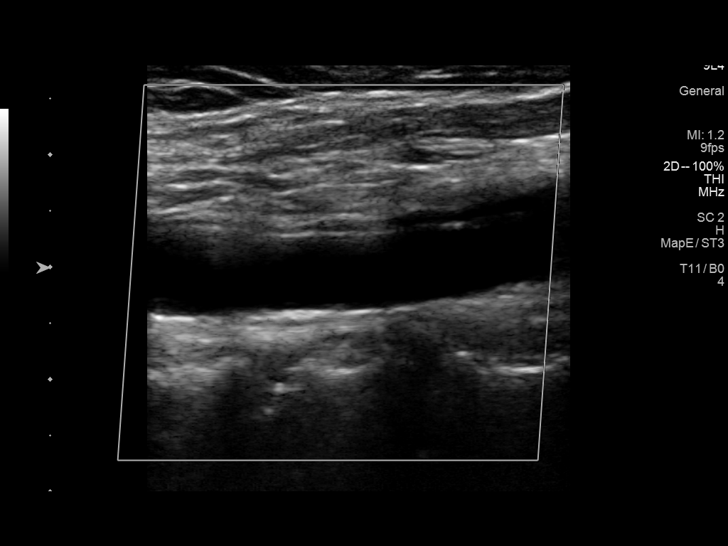
[im 54/66]
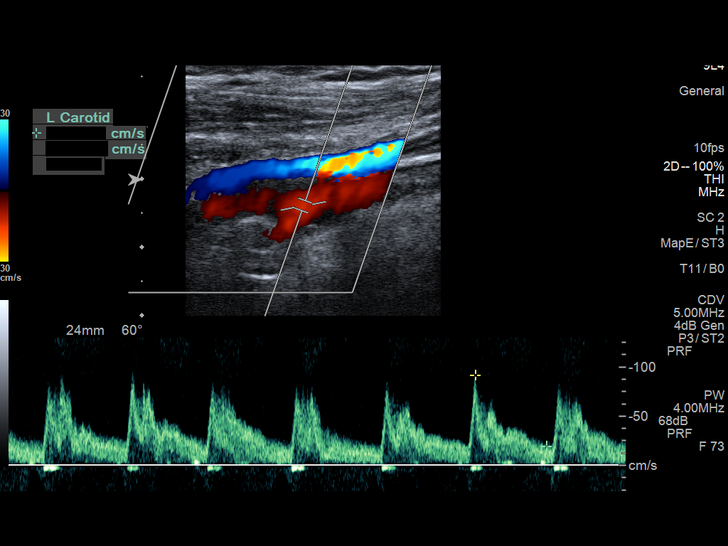
[im 60/66]
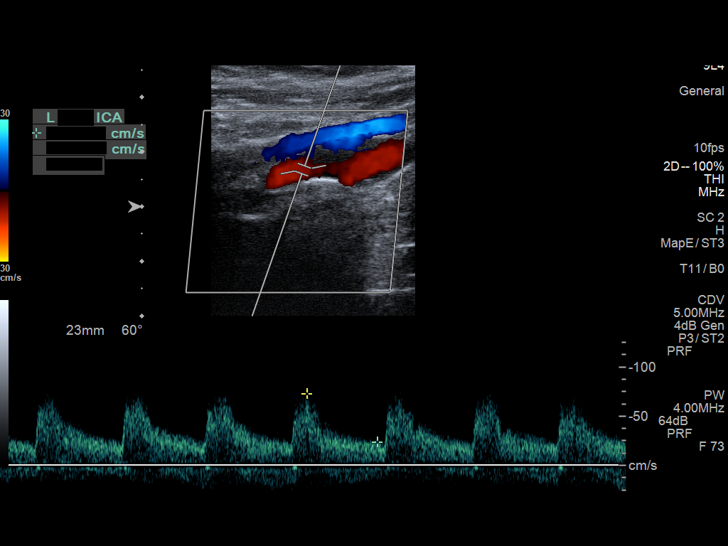
[im 66/66]
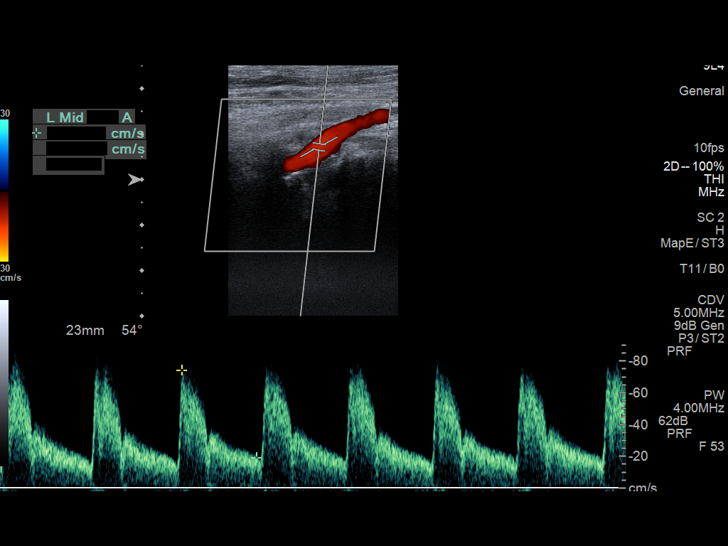

[13 of 24 positions shown; findings below may reference images not displayed]

FINDINGS: Criteria: Quantification of carotid stenosis is based on velocity
parameters that correlate the residual internal carotid diameter
with NASCET-based stenosis levels, using the diameter of the distal
internal carotid lumen as the denominator for stenosis measurement.

The following velocity measurements were obtained:

RIGHT

ICA: 90/23 cm/sec

CCA: 170/14 cm/sec

SYSTOLIC ICA/CCA RATIO:

ECA: 68 cm/sec

LEFT

ICA: 93/31 cm/sec

CCA: 162/29 cm/sec

SYSTOLIC ICA/CCA RATIO:

ECA: 96 cm/sec

RIGHT CAROTID ARTERY: Minor echogenic shadowing plaque formation. No
hemodynamically significant right ICA stenosis, velocity elevation,
or turbulent flow. Degree of narrowing less than 50%.

RIGHT VERTEBRAL ARTERY:  Normal antegrade flow

LEFT CAROTID ARTERY: Similar scattered minor echogenic plaque
formation. No hemodynamically significant left ICA stenosis,
velocity elevation, or turbulent flow.

LEFT VERTEBRAL ARTERY:  Normal antegrade flow

Upper extremity blood pressures: RIGHT: 138/80 LEFT: 123/76
IMPRESSION: Minor carotid atherosclerosis. No hemodynamically significant ICA
stenosis. Degree of narrowing less than 50% bilaterally by
ultrasound criteria.

Patent antegrade vertebral flow bilaterally

## 2021-01-04 IMAGING — US US SOFT TISSUE HEAD/NECK
1 series · 13 of 13 positions shown · non-contrast
Comparison: None.

CLINICAL DATA: Right submandibular palpable lymph node

EXAM:
ULTRASOUND OF HEAD/NECK SOFT TISSUES
TECHNIQUE: Ultrasound examination of the head and neck soft tissues was
performed in the area of clinical concern.

[Series 1: us soft tissue head/neck · 0.06mm/px · 13 of 13 slices shown]
[im 1/13]
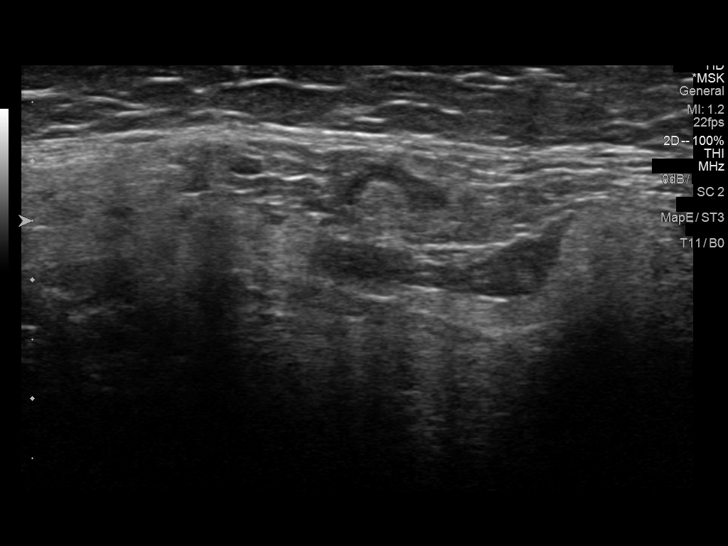
[im 2/13]
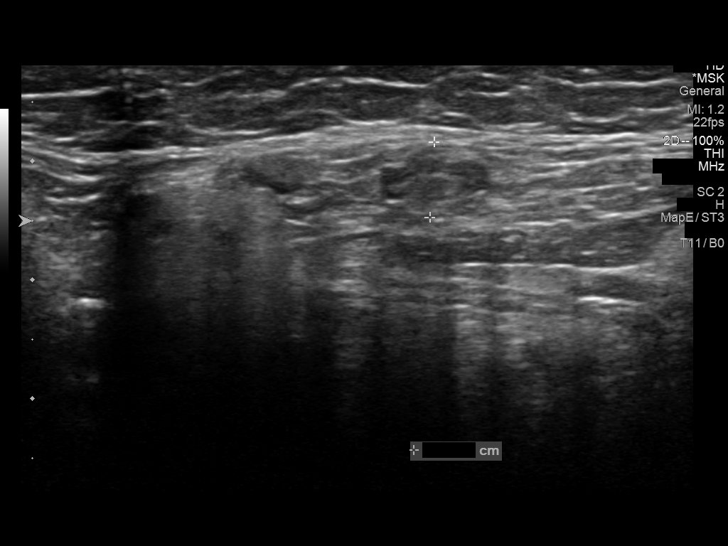
[im 3/13]
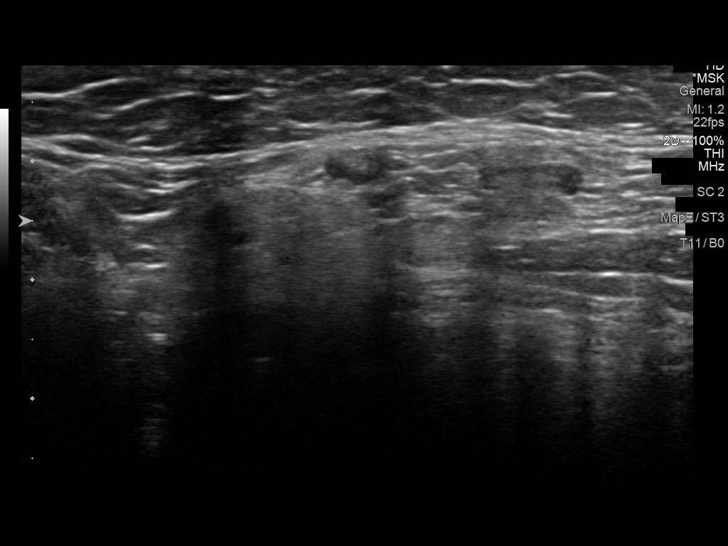
[im 4/13]
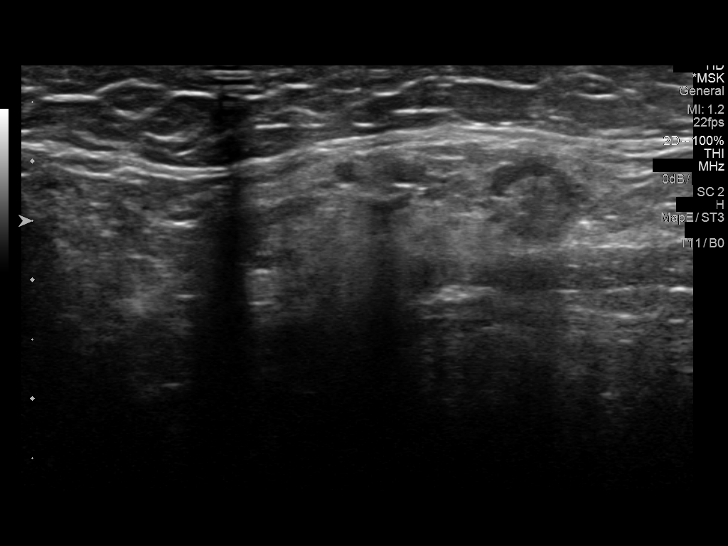
[im 5/13]
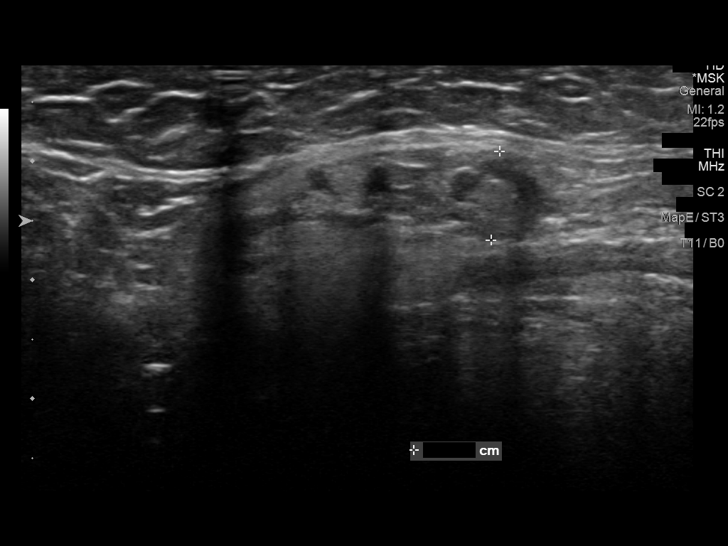
[im 6/13]
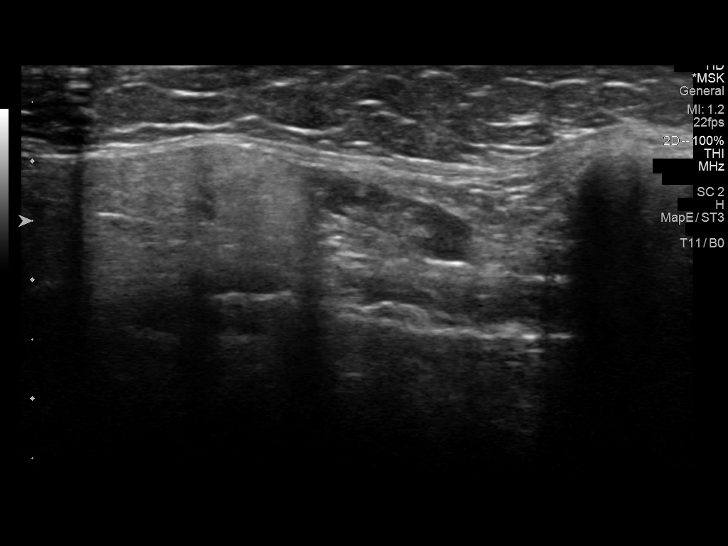
[im 7/13]
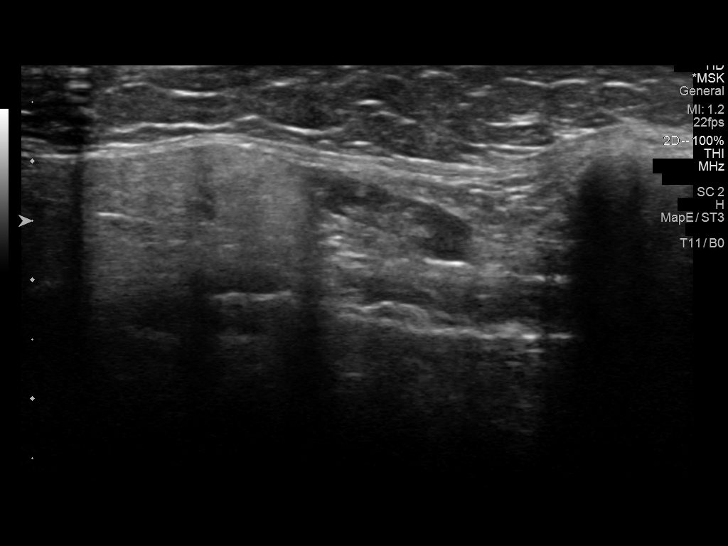
[im 8/13]
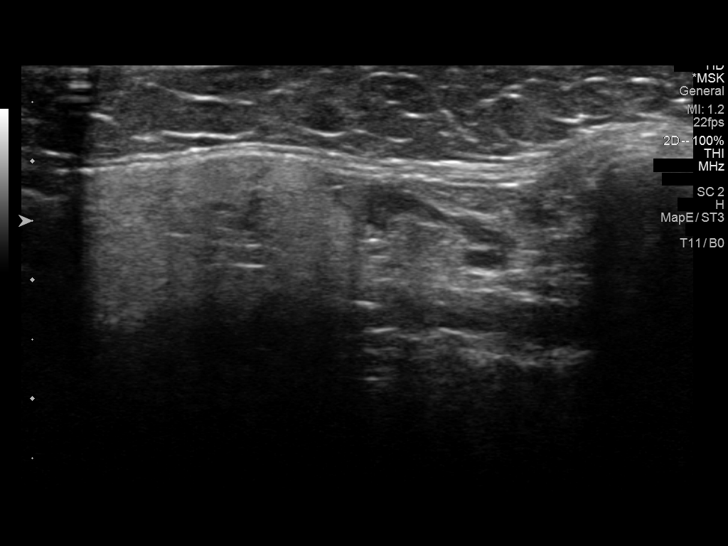
[im 9/13]
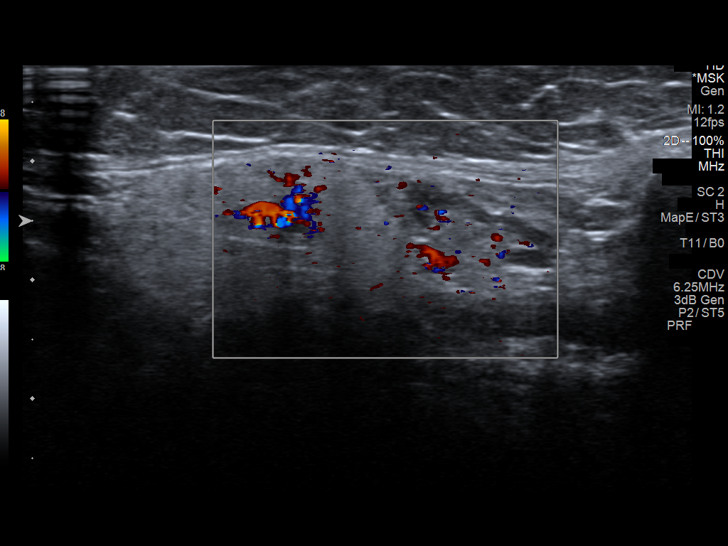
[im 10/13]
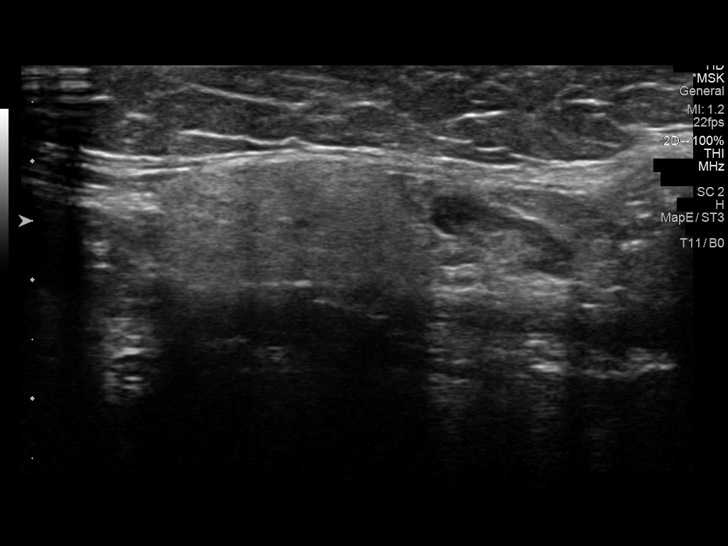
[im 11/13]
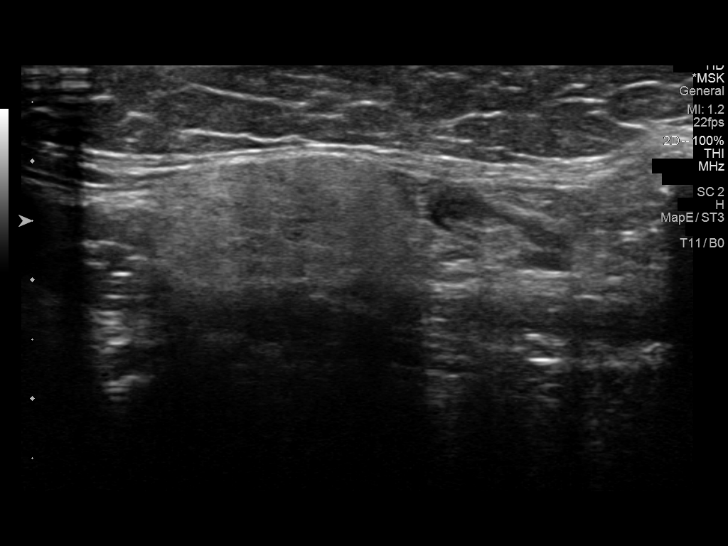
[im 12/13]
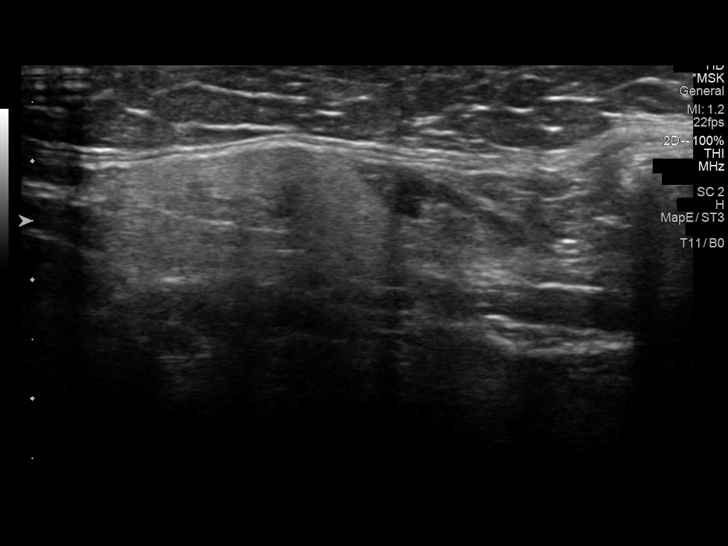
[im 13/13]
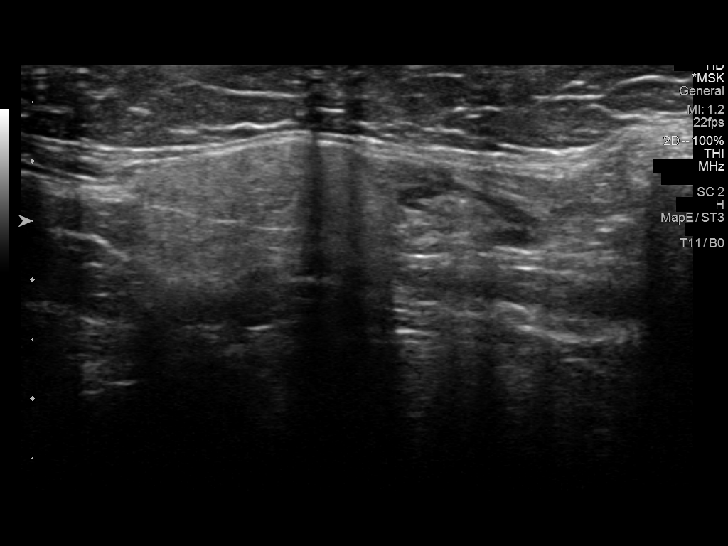

[13 of 13 positions shown; findings below may reference images not displayed]

FINDINGS: Superficial soft tissue ultrasound performed of the right
submandibular area of concern. In this region, there is a small
benign-appearing submandibular lymph node with preserved hypoechoic
cortex and fatty hila measuring 8 mm in short axis. No other
significant soft tissue abnormality, mass, cyst, fluid collection,
hematoma, or bulky adenopathy.
IMPRESSION: Right submandibular palpable abnormality correlates with benign
appearing cervical lymph node.

## 2021-01-12 ENCOUNTER — Encounter: Payer: 59 | Admitting: Gastroenterology

## 2021-01-19 ENCOUNTER — Other Ambulatory Visit: Payer: Self-pay | Admitting: Family Medicine

## 2021-01-19 ENCOUNTER — Ambulatory Visit
Admission: RE | Admit: 2021-01-19 | Discharge: 2021-01-19 | Disposition: A | Discharge status: Home / Self Care | Payer: 59 | Source: Ambulatory Visit | Attending: Cardiology | Admitting: Cardiology

## 2021-01-19 DIAGNOSIS — I712 Thoracic aortic aneurysm, without rupture, unspecified: Secondary | ICD-10-CM

## 2021-01-19 IMAGING — CT CT ANGIO CHEST
1 of 2 series · 18 of 32 positions shown · IV contrast (APPLIED)
Comparison: Chest CT [DATE]

CLINICAL DATA: Follow-up thoracic aortic aneurysm.

EXAM:
CT ANGIOGRAPHY CHEST WITH CONTRAST
TECHNIQUE: Multidetector CT imaging of the chest was performed using the
standard protocol during bolus administration of intravenous
contrast. Multiplanar CT image reconstructions and MIPs were
obtained to evaluate the vascular anatomy.
Contrast bolus timing inadvertently utilized for pulmonary embolus
evaluation, however remains diagnostic for aortic evaluation.
CONTRAST:  75mL [HP] IOPAMIDOL ([HP]) INJECTION 76%

[Series 11: thins 1.0 b31s · axial · 0.71mm/px · z∈[-332,-27]mm · 18 of 335 slices shown]
[im 15/335  lung]
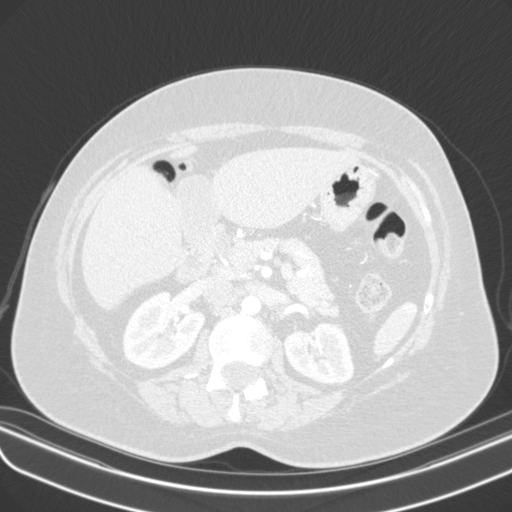
[im 30/335  soft-tissue]
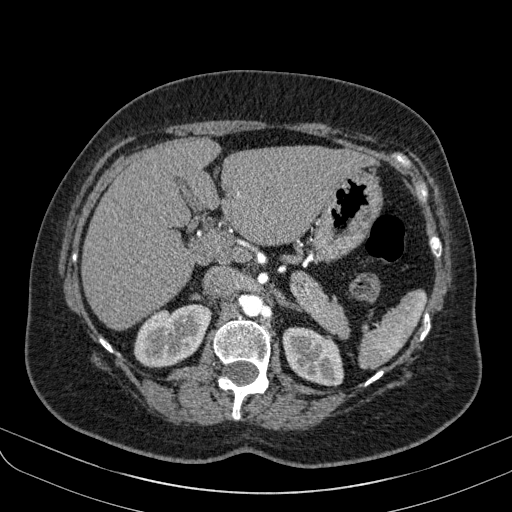
[im 59/335  lung]
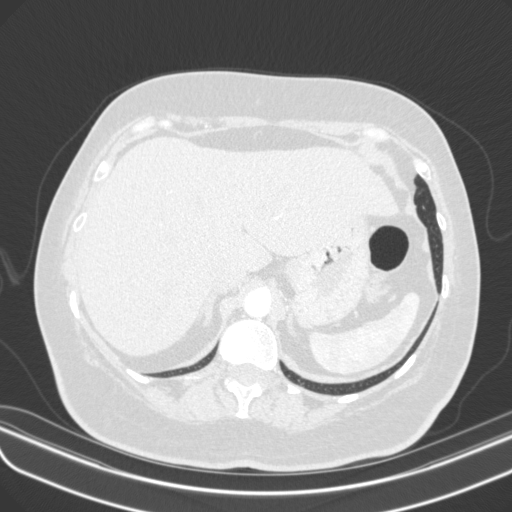
[im 73/335  soft-tissue]
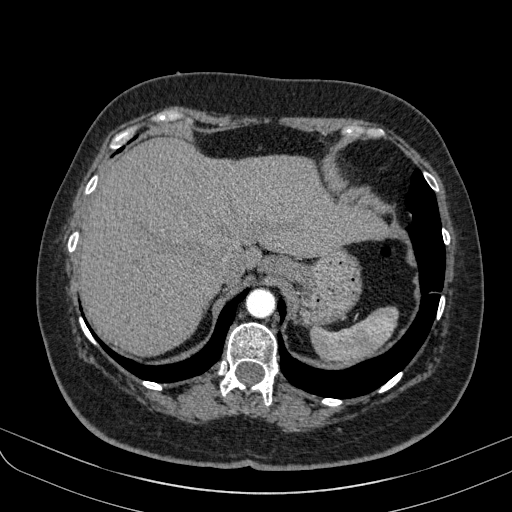
[im 88/335  lung]
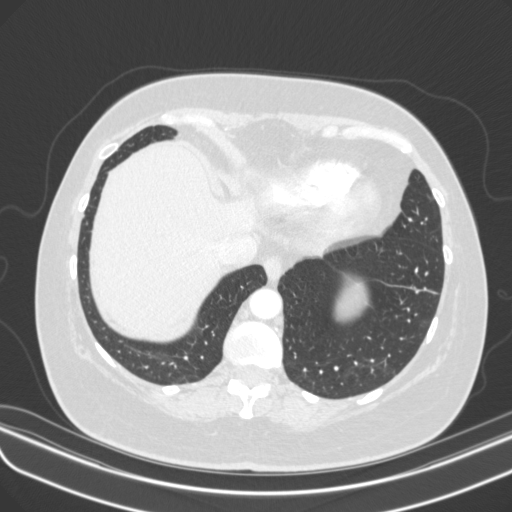
[im 102/335  soft-tissue]
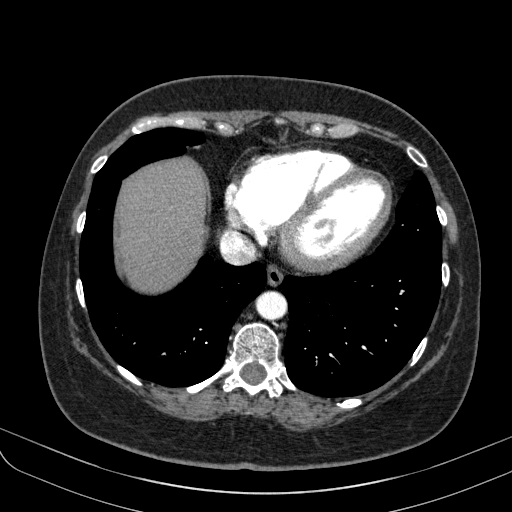
[im 117/335  lung]
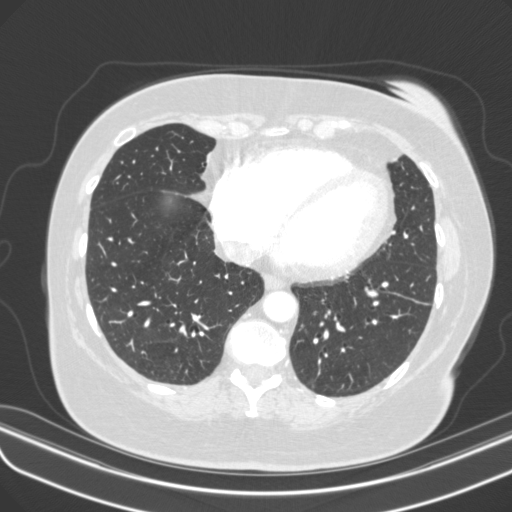
[im 146/335  soft-tissue]
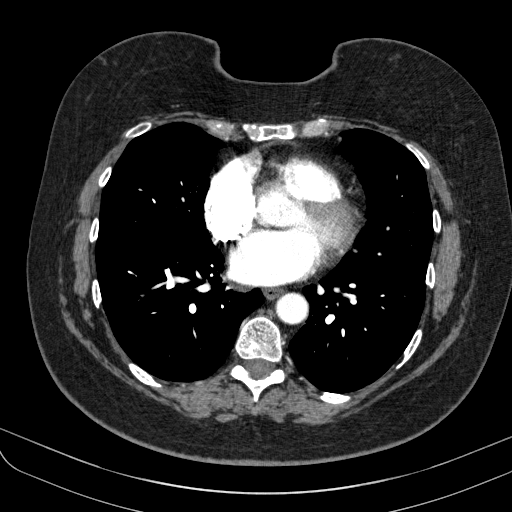
[im 160/335  lung]
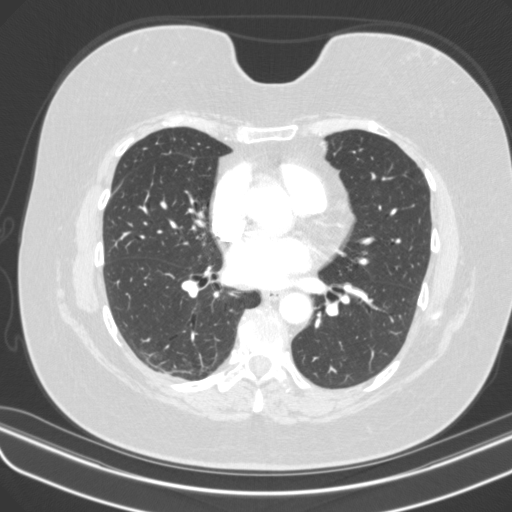
[im 175/335  soft-tissue]
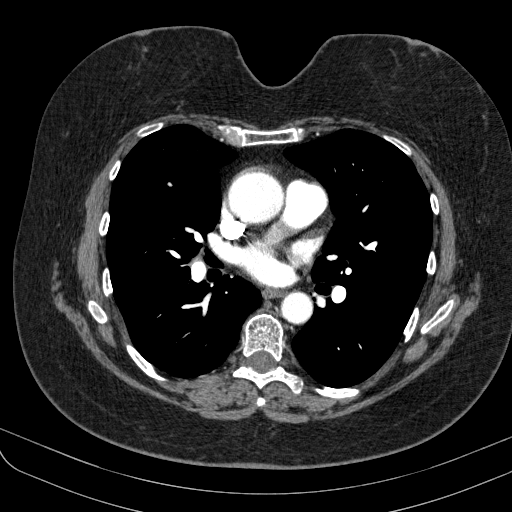
[im 189/335  lung]
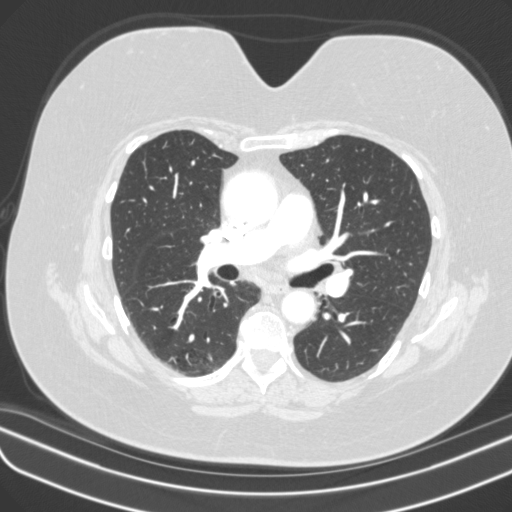
[im 218/335  soft-tissue]
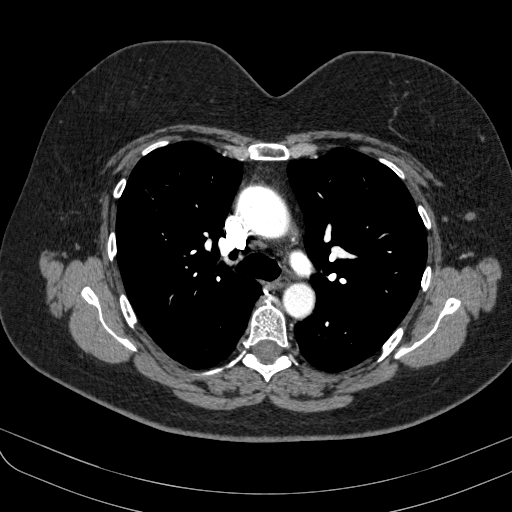
[im 233/335  lung]
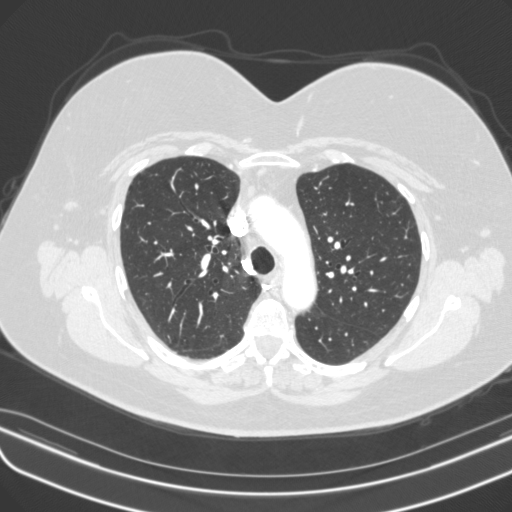
[im 247/335  soft-tissue]
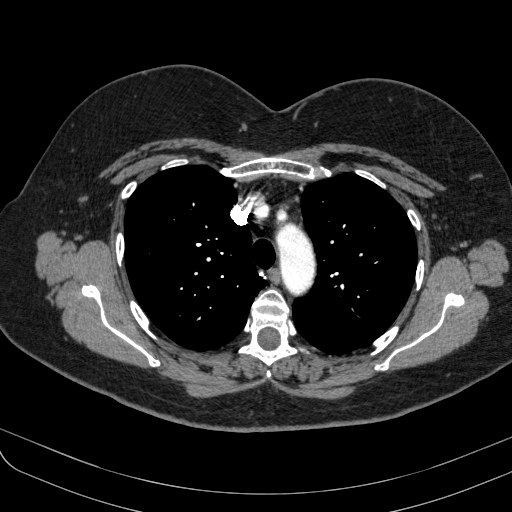
[im 262/335  lung]
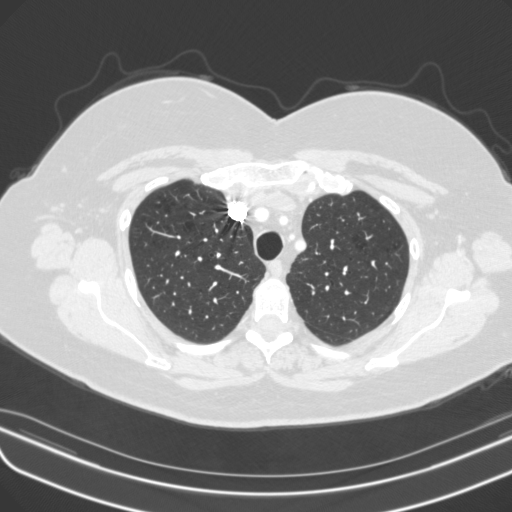
[im 276/335  soft-tissue]
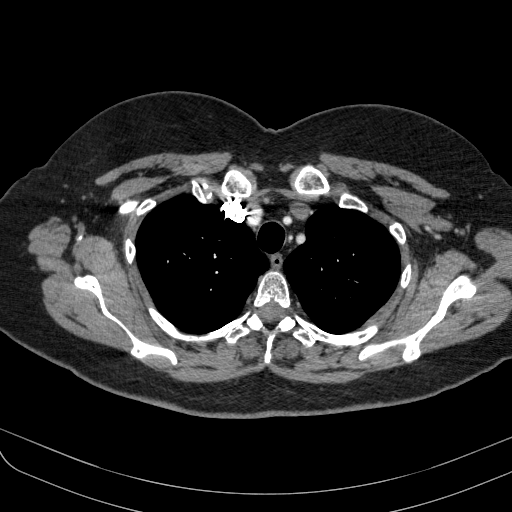
[im 305/335  lung]
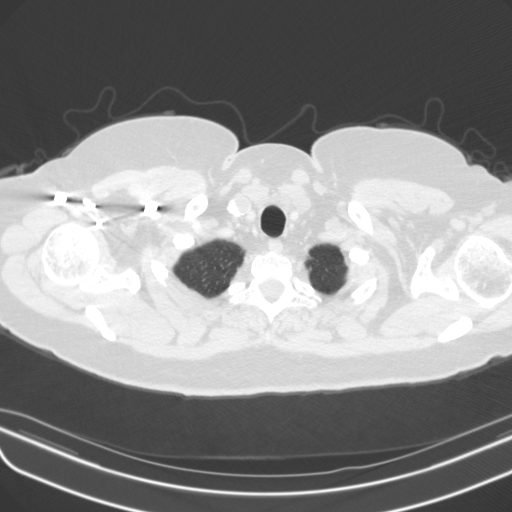
[im 320/335  soft-tissue]
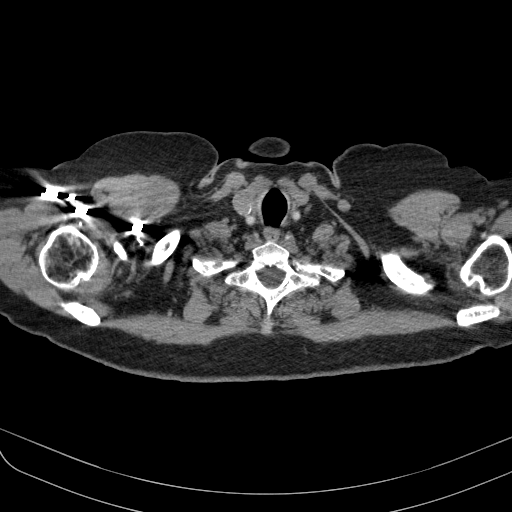

[18 of 32 positions shown; findings below may reference images not displayed]

FINDINGS: Cardiovascular: Stable fusiform aneurysmal dilatation of the
ascending aorta, maximal dimension 4 cm, measured on series 5, image
52. chest series and descending thoracic aorta are normal in
caliber. Left vertebral artery arises directly from the thoracic
aorta, variant arch anatomy. No significant thoracic aortic
atherosclerosis. There are coronary artery calcifications. The heart
is normal in size. No pericardial effusion. No filling defects in
the pulmonary arteries to suggest pulmonary embolus.

Mediastinum/Nodes: No enlarged mediastinal or hilar lymph nodes.
Small mediastinal nodes are likely reactive. No thyroid nodule.
Decompressed esophagus. No axillary adenopathy.

Lungs/Pleura: Minimal apical predominant emphysema. Subsegmental
linear atelectasis or scarring in the right middle lobe and left
lower lobe. No confluent consolidation. No pleural fluid. No
pulmonary mass or suspicious nodule. Trachea and central bronchi are
patent.

Upper Abdomen: No acute or unexpected findings.

Musculoskeletal: Mild midthoracic spondylosis with spurring. There
are no acute or suspicious osseous abnormalities.

Review of the MIP images confirms the above findings.
IMPRESSION: 1. Stable fusiform aneurysmal dilatation of the ascending aorta,
maximal dimension 4 cm. Recommend annual imaging followup by CTA or
MRA. This recommendation follows [HP]
ACCF/AHA/AATS/ACR/ASA/SCA/HARUMITSU/HARUMITSU/HARUMITSU/HARUMITSU Guidelines for the
Diagnosis and Management of Patients with Thoracic Aortic Disease.
Circulation. [HP]; 121: [id number]. Aortic aneurysm NOS ([HP]-[HP])
2. Coronary artery calcifications.
3. Mild emphysema. No acute pulmonary process or pulmonary
nodule/mass.

Aortic Atherosclerosis ([HP]-[HP]) and Emphysema ([HP]-[HP]).

## 2021-01-19 MED ORDER — IOPAMIDOL (ISOVUE-370) INJECTION 76%
75.0000 mL | Freq: Once | INTRAVENOUS | Status: AC | PRN
  Administered 2021-01-19: 75 mL via INTRAVENOUS

## 2021-01-26 ENCOUNTER — Ambulatory Visit (HOSPITAL_COMMUNITY): Payer: 59 | Attending: Internal Medicine

## 2021-01-26 ENCOUNTER — Telehealth: Payer: Self-pay | Admitting: Family Medicine

## 2021-01-26 ENCOUNTER — Other Ambulatory Visit: Payer: Self-pay

## 2021-01-26 DIAGNOSIS — I25119 Atherosclerotic heart disease of native coronary artery with unspecified angina pectoris: Secondary | ICD-10-CM | POA: Insufficient documentation

## 2021-01-26 DIAGNOSIS — R42 Dizziness and giddiness: Secondary | ICD-10-CM | POA: Insufficient documentation

## 2021-01-26 DIAGNOSIS — R0602 Shortness of breath: Secondary | ICD-10-CM | POA: Diagnosis present

## 2021-01-26 DIAGNOSIS — R600 Localized edema: Secondary | ICD-10-CM | POA: Diagnosis present

## 2021-01-26 DIAGNOSIS — G4459 Other complicated headache syndrome: Secondary | ICD-10-CM | POA: Insufficient documentation

## 2021-01-26 LAB — ECHOCARDIOGRAM COMPLETE
Area-P 1/2: 2.99 cm2
S' Lateral: 3.2 cm

## 2021-01-26 NOTE — Telephone Encounter (Signed)
Spoke with pt regarding labs and instructions.   

## 2021-01-26 NOTE — Telephone Encounter (Signed)
Echocardiogram results are reassuring and essentially normal.  Please remind patient she was to follow-up for her lower extremity edema and shortness of breath with this provider.  Please make sure she schedules a follow-up so that we can check her dyspnea, neck swelling and lower extremity edema.  Thanks

## 2021-02-02 ENCOUNTER — Ambulatory Visit (AMBULATORY_SURGERY_CENTER): Payer: 59 | Admitting: *Deleted

## 2021-02-02 ENCOUNTER — Other Ambulatory Visit: Payer: Self-pay

## 2021-02-02 VITALS — Ht 67.0 in | Wt 188.0 lb

## 2021-02-02 DIAGNOSIS — Z800 Family history of malignant neoplasm of digestive organs: Secondary | ICD-10-CM

## 2021-02-02 NOTE — Progress Notes (Signed)
Pt's previsit is done over the phone and all paperwork (prep instructions, blank consent form to just read over) sent to patient.  Pt's name and DOB verified at the beginning of the previsit.  Pt denies any difficulty with ambulating.   Pt had PONV after cystoscopy in 2021, denies being told she is difficult to intubate, or fam hx/hx of malignant hyperthermia  Pt has Suprep at home  2 day prep given- she states she has constipation   No egg or soy allergy  No home oxygen use   No medications for weight loss taken

## 2021-02-08 ENCOUNTER — Other Ambulatory Visit: Payer: Self-pay

## 2021-02-08 ENCOUNTER — Encounter: Payer: Self-pay | Admitting: Family Medicine

## 2021-02-08 ENCOUNTER — Ambulatory Visit (INDEPENDENT_AMBULATORY_CARE_PROVIDER_SITE_OTHER): Payer: 59 | Admitting: Family Medicine

## 2021-02-08 VITALS — BP 102/66 | HR 52 | Temp 97.4°F | Ht 67.0 in | Wt 188.0 lb

## 2021-02-08 DIAGNOSIS — E785 Hyperlipidemia, unspecified: Secondary | ICD-10-CM

## 2021-02-08 DIAGNOSIS — E039 Hypothyroidism, unspecified: Secondary | ICD-10-CM | POA: Diagnosis not present

## 2021-02-08 DIAGNOSIS — R600 Localized edema: Secondary | ICD-10-CM

## 2021-02-08 DIAGNOSIS — F321 Major depressive disorder, single episode, moderate: Secondary | ICD-10-CM

## 2021-02-08 DIAGNOSIS — I10 Essential (primary) hypertension: Secondary | ICD-10-CM

## 2021-02-08 DIAGNOSIS — R0602 Shortness of breath: Secondary | ICD-10-CM

## 2021-02-08 DIAGNOSIS — Z87898 Personal history of other specified conditions: Secondary | ICD-10-CM | POA: Diagnosis not present

## 2021-02-08 DIAGNOSIS — F17209 Nicotine dependence, unspecified, with unspecified nicotine-induced disorders: Secondary | ICD-10-CM

## 2021-02-08 DIAGNOSIS — F419 Anxiety disorder, unspecified: Secondary | ICD-10-CM

## 2021-02-08 DIAGNOSIS — Z8249 Family history of ischemic heart disease and other diseases of the circulatory system: Secondary | ICD-10-CM

## 2021-02-08 DIAGNOSIS — E663 Overweight: Secondary | ICD-10-CM

## 2021-02-08 DIAGNOSIS — I25119 Atherosclerotic heart disease of native coronary artery with unspecified angina pectoris: Secondary | ICD-10-CM

## 2021-02-08 DIAGNOSIS — J439 Emphysema, unspecified: Secondary | ICD-10-CM

## 2021-02-08 NOTE — Patient Instructions (Signed)
I have referred you to pulmonology for formal evaluation of your COPD.  Your shortness of breath is not cardiac in nature by tests  Weight loss, smoking cessation with help tremendously.    Chronic Obstructive Pulmonary Disease  Chronic obstructive pulmonary disease (COPD) is a long-term (chronic) lung problem. When you have COPD, it is hard for air to get in and out of your lungs. Usually the condition gets worse over time, and your lungs will never return to normal. There are things you can do to keep yourself as healthy as possible. What are the causes?  Smoking. This is the most common cause.  Certain genes passed from parent to child (inherited). What increases the risk?  Being exposed to secondhand smoke from cigarettes, pipes, or cigars.  Being exposed to chemicals and other irritants, such as fumes and dust in the work environment.  Having chronic lung conditions or infections. What are the signs or symptoms?  Shortness of breath, especially during physical activity.  A long-term cough with a large amount of thick mucus. Sometimes, the cough may not have any mucus (dry cough).  Wheezing.  Breathing quickly.  Skin that looks gray or blue, especially in the fingers, toes, or lips.  Feeling tired (fatigue).  Weight loss.  Chest tightness.  Having infections often.  Episodes when breathing symptoms become much worse (exacerbations). At the later stages of this disease, you may have swelling in the ankles, feet, or legs. How is this treated?  Taking medicines.  Quitting smoking, if you smoke.  Rehabilitation. This includes steps to make your body work better. It may involve a team of specialists.  Doing exercises.  Making changes to your diet.  Using oxygen.  Lung surgery.  Lung transplant.  Comfort measures (palliative care). Follow these instructions at home: Medicines  Take over-the-counter and prescription medicines only as told by your  doctor.  Talk to your doctor before taking any cough or allergy medicines. You may need to avoid medicines that cause your lungs to be dry. Lifestyle  If you smoke, stop smoking. Smoking makes the problem worse.  Do not smoke or use any products that contain nicotine or tobacco. If you need help quitting, ask your doctor.  Avoid being around things that make your breathing worse. This may include smoke, chemicals, and fumes.  Stay active, but remember to rest as well.  Learn and use tips on how to manage stress and control your breathing.  Make sure you get enough sleep. Most adults need at least 7 hours of sleep every night.  Eat healthy foods. Eat smaller meals more often. Rest before meals. Controlled breathing Learn and use tips on how to control your breathing as told by your doctor. Try:  Breathing in (inhaling) through your nose for 1 second. Then, pucker your lips and breath out (exhale) through your lips for 2 seconds.  Putting one hand on your belly (abdomen). Breathe in slowly through your nose for 1 second. Your hand on your belly should move out. Pucker your lips and breathe out slowly through your lips. Your hand on your belly should move in as you breathe out.   Controlled coughing Learn and use controlled coughing to clear mucus from your lungs. Follow these steps: 1. Lean your head a little forward. 2. Breathe in deeply. 3. Try to hold your breath for 3 seconds. 4. Keep your mouth slightly open while coughing 2 times. 5. Spit any mucus out into a tissue. 6. Rest and do  the steps again 1 or 2 times as needed. General instructions  Make sure you get all the shots (vaccines) that your doctor recommends. Ask your doctor about a flu shot and a pneumonia shot.  Use oxygen therapy and pulmonary rehabilitation if told by your doctor. If you need home oxygen therapy, ask your doctor if you should buy a tool to measure your oxygen level (oximeter).  Make a COPD action  plan with your doctor. This helps you to know what to do if you feel worse than usual.  Manage any other conditions you have as told by your doctor.  Avoid going outside when it is very hot, cold, or humid.  Avoid people who have a sickness you can catch (contagious).  Keep all follow-up visits. Contact a doctor if:  You cough up more mucus than usual.  There is a change in the color or thickness of the mucus.  It is harder to breathe than usual.  Your breathing is faster than usual.  You have trouble sleeping.  You need to use your medicines more often than usual.  You have trouble doing your normal activities such as getting dressed or walking around the house. Get help right away if:  You have shortness of breath while resting.  You have shortness of breath that stops you from: ? Being able to talk. ? Doing normal activities.  Your chest hurts for longer than 5 minutes.  Your skin color is more blue than usual.  Your pulse oximeter shows that you have low oxygen for longer than 5 minutes.  You have a fever.  You feel too tired to breathe normally. These symptoms may represent a serious problem that is an emergency. Do not wait to see if the symptoms will go away. Get medical help right away. Call your local emergency services (911 in the [place] Do not drive yourself to the hospital. Summary  Chronic obstructive pulmonary disease (COPD) is a long-term lung problem.  The way your lungs work will never return to normal. Usually the condition gets worse over time. There are things you can do to keep yourself as healthy as possible.  Take over-the-counter and prescription medicines only as told by your doctor.  If you smoke, stop. Smoking makes the problem worse. This information is not intended to replace advice given to you by your health care provider. Make sure you discuss any questions you have with your health care provider. Document Revised: 07/18/2020  Document Reviewed: 07/18/2020 Elsevier Patient Education  2021 [employer].

## 2021-02-08 NOTE — Progress Notes (Signed)
This visit occurred during the SARS-CoV-2 public health emergency.  Safety protocols were in place, including screening questions prior to the visit, additional usage of staff PPE, and extensive cleaning of exam room while observing appropriate contact time as indicated for disinfecting solutions.    Mary Escobar , 1966/06/17, 55 y.o., female MRN: [id number] Patient Care Team    Relationship Specialty Notifications Start End  Ma Hillock, DO PCP - General Family Medicine  06/09/20   Lelon Perla, MD PCP - Cardiology Cardiology Admissions 12/08/19   Alexis Frock, MD Consulting Physician Urology  06/12/20   [facility]    06/12/20   Kerry Dory, NP Nurse Practitioner Nurse Practitioner  06/12/20    Comment: [facility]    Chief Complaint  Patient presents with  . Leg Swelling  . Follow-up     Subjective: Mary Escobar is a 55 y.o. female  Pt presents for an OV follow up on multiple worsening chronic and acute conditions.  Neck swelling: Patient reports her neck still is mildly swollen she feels the lymph nodes have gone down.  Ultrasound was reassuring for benign appearing lymph node. Prior note: She reports a "knot "just under the right side of her chin that has been present for about 1 month.  She says the knot is not tender but she feels like it might be getting larger.  She has had 2 URIs over the last 6 weeks.  She is an everyday smoker.  She denies compression feelings of her neck or dysphagia.  Bilateral lower extremity swelling: Patient reports the lower extremity edema is greatly improved.  She is no longer taking Lasix or the potassium.  She has lost 11-14 pounds of fluid after the Lasix.  Reviewed echocardiogram and carotid Doppler studies with her today which are reassuring. Prior note: Patient states that her legs have been swollen every day for the last month.  She reports it can be improved in the morning but it is still  present in the morning.  She is on amlodipine 5 mg daily.  She does endorse dyspnea, and has a history of COPD.  She is established with cardiology for her history of CAD.  She has gained 14 pounds in 2 and half months.  Hypertension/HLD-LDL goal less than 70/palpitations/CAD/family history of premature CAD She has a significant medical history of nonobstructive CAD, coronary CTA 5/821 showed mild (25-49%) calcified plaque in the LAD, she was sent for [facility] which showed no hemodynamically significant stenosis.  Her calcium score was 156 placing her in the 96 percentile.  She has had a prior history of cardiac catheterization in 2009 after a abnormal stress treadmill test which revealed nonobstructive CAD-20% ostial LAD stenosis and 30% left circumferential disease. Pt reports compliance with metoprolol 12.5-25 mg daily.  She has not been taking the amlodipine.  Patient denies chest pain, shortness of breath, dizziness or lower extremity edema.  Pt is compliant with daily baby ASA. Pt is  prescribed statin. RF: Hypertension, CAD, family history of premature CAD, smoker, hyperlipidemia   Pulmonary emphysema, unspecified emphysema type (HCC)/nicotine dependence Patient reports she still is having feelings of shortness of breath.  Cardiac causes have been ruled out.  She reports compliance with her Ellipta Trelegy inhaler.  She does not use her albuterol often.  She states she does feel better when she does. Prior note: She reports history of COPD and use of Trelegy Ellipta.  She states her  COPD been controlled on this medication but she has noticed an increase in her shortness of breath. She is prescribed albuterol and rarely uses.  She is still smoking at this time however she has cut back and is using Wellbutrin and her smoking cessation.  Echo 01/26/2021: IMPRESSIONS  1. Left ventricular ejection fraction, by estimation, is 60 to 65%. The  left ventricle has normal function. The left ventricle has no  regional  wall motion abnormalities. Left ventricular diastolic parameters were  normal. The average left ventricular  global longitudinal strain is -24.9 %. The global longitudinal strain is  normal.  2. Right ventricular systolic function is normal. The right ventricular  size is normal. There is normal pulmonary artery systolic pressure. The  estimated right ventricular systolic pressure is [id number] mmHg.  3. The mitral valve is grossly normal. Trivial mitral valve  regurgitation.  4. The aortic valve is tricuspid. Aortic valve regurgitation is not  visualized.  5. The inferior vena cava is normal in size with greater than 50%  respiratory variability, suggesting right atrial pressure of 3 mmHg.   Neck soft tissue US 01/04/2021: IMPRESSION: Right submandibular palpable abnormality correlates with benign appearing cervical lymph node.   US Carotid 01/04/2021: IMPRESSION: Minor carotid atherosclerosis. No hemodynamically significant ICA stenosis. Degree of narrowing less than 50% bilaterally by ultrasound criteria.  Patent antegrade vertebral flow bilaterally Depression screen [facility] 2/9 02/08/2021 11/20/2020 06/23/2020 06/09/2020 01/11/2020  Decreased Interest 0 0 0 0 0  Down, Depressed, Hopeless 0 0 2 0 3  PHQ - 2 Score 0 0 2 0 3  Altered sleeping - 0 3 0 3  Tired, decreased energy - 0 3 0 3  Change in appetite - 0 1 0 0  Feeling bad or failure about yourself  - 0 0 0 0  Trouble concentrating - 0 0 0 0  Moving slowly or fidgety/restless - 0 0 0 0  Suicidal thoughts - 0 0 0 0  PHQ-9 Score - 0 9 0 9  Difficult doing work/chores - - - Not difficult at all Not difficult at all    Allergies  Allergen Reactions  . Citalopram Palpitations  . Ciprofloxacin Nausea And Vomiting  . Effexor [Venlafaxine]     nausea  . Sulfonamide Derivatives Rash    REACTION: unknown rx   Social History   Social History Narrative   Marital status/children/pets: Single, 1 child.     education/employment: 12th grade education, employed as a Biomedical engineer.   Safety:      -smoke alarm in the home:Yes     - wears seatbelt: Yes     - Feels safe in their relationships: Yes   Past Medical History:  Diagnosis Date  . Allergy   . Anxiety   . Arthritis   . Chronic constipation 01/11/2020  . Chronic kidney disease    kidney stones  . COPD (chronic obstructive pulmonary disease) ([place])   . DOE (dyspnea on exertion) 01/11/2020  . Fainting spell   . Family history of premature CAD   . Fatigue 2020   multifactoral  . GERD (gastroesophageal reflux disease)   . History of kidney stones   . Hot flashes 02/11/2020  . Hyperlipidemia   . Hypertension   . Hypothyroid   . IBS (irritable bowel syndrome)   . Kidney stone 08/18/2019  . mild nonobstructive CAD on cath 2009   . Osteoporosis   . Other complicated headache syndrome 12/21/2020  . Palpitation   . Polyarthralgia  12/08/2018  . Smoker   . Snoring 09/08/2018  . Vitamin D deficiency    Past Surgical History:  Procedure Laterality Date  . CORONARY ANGIOGRAM  2009   mild CAD noted after abn GXT  . CYSTOSCOPY W/ URETERAL STENT PLACEMENT Right 03/31/2020   Procedure: CYSTOSCOPY WITH STENT PLACEMENT;  Surgeon: Alexis Frock, MD;  Location: [facility];  Service: Urology;  Laterality: Right;  . CYSTOSCOPY WITH RETROGRADE PYELOGRAM, URETEROSCOPY AND STENT PLACEMENT Right 04/21/2020   Procedure: CYSTOSCOPY WITH RETROGRADE PYELOGRAM, RIGHT URETEROSCOPY;  Surgeon: Alexis Frock, MD;  Location: [facility];  Service: Urology;  Laterality: Right;  1 HR  . HOLMIUM LASER APPLICATION Right 0/86/5784   Procedure: HOLMIUM LASER APPLICATION;  Surgeon: Alexis Frock, MD;  Location: [facility];  Service: Urology;  Laterality: Right;  . WRIST SURGERY     Family History  Problem Relation Age of Onset  . CAD Father        MI at age 29  . Heart disease Father        CABG x 3  . Sleep apnea Father   . Arrhythmia Father        pacemaker  . COPD  Father   . Diabetes Father   . Thyroid disease Father   . Hyperlipidemia Father   . Arthritis Father   . Hypertension Father   . Heart attack Father   . Colon polyps Father   . Diabetes Mother   . Other Mother        carcinoid tumor  . Hyperlipidemia Mother   . Arthritis Mother   . Hypertension Mother   . Colon cancer Mother   . Crohn's disease Sister   . Colon cancer Maternal Aunt   . Arthritis Maternal Grandmother   . Diabetes Maternal Grandmother   . Heart attack Maternal Grandmother   . Heart disease Maternal Grandmother   . Arthritis Maternal Grandfather   . Heart attack Maternal Grandfather 51       died 92 of MI  . Arthritis Paternal Grandmother   . Stroke Paternal Grandmother   . Arthritis Paternal Grandfather   . Heart attack Paternal Grandfather 46       Died of MI  . Healthy Daughter   . Esophageal cancer Neg Hx   . Stomach cancer Neg Hx   . Rectal cancer Neg Hx    Allergies as of 02/08/2021      Reactions   Citalopram Palpitations   Ciprofloxacin Nausea And Vomiting   Effexor [venlafaxine]    nausea   Sulfonamide Derivatives Rash   REACTION: unknown rx      Medication List       Accurate as of Feb 08, 2021  9:11 AM. If you have any questions, ask your nurse or doctor.        STOP taking these medications   amLODipine 5 MG tablet Commonly known as: NORVASC Stopped by: Howard Pouch, DO   furosemide 20 MG tablet Commonly known as: LASIX Stopped by: Howard Pouch, DO   potassium chloride 10 MEQ tablet Commonly known as: KLOR-CON Stopped by: Howard Pouch, DO     TAKE these medications   aspirin EC 81 MG tablet Take 81 mg by mouth daily. Swallow whole.   BIOTIN 5000 PO Take by mouth.   buPROPion 300 MG 24 hr tablet Commonly known as: WELLBUTRIN XL Take 1 tablet (300 mg total) by mouth daily.   cetirizine 10 MG tablet Commonly known as: ZYRTEC Take 1 tablet (10  mg total) by mouth daily.   cyanocobalamin 1000 MCG tablet Take 1,000 mcg  by mouth daily.   D3-1000 PO Take by mouth.   ELDERBERRY PO Take 1 tablet by mouth daily.   escitalopram 5 MG tablet Commonly known as: LEXAPRO Take 1 tablet (5 mg total) by mouth daily.   fluticasone 50 MCG/ACT nasal spray Commonly known as: FLONASE Place 2 sprays into both nostrils in the morning and at bedtime. What changed: additional instructions   levothyroxine 75 MCG tablet Commonly known as: Synthroid Take 1 tablet (75 mcg total) by mouth daily. Prior scripts- new provider   MAGNESIUM PO Take 500 mg by mouth daily.   metoprolol succinate 25 MG 24 hr tablet Commonly known as: TOPROL-XL Take 0.5-1 tablets (12.5-25 mg total) by mouth daily.   nitroGLYCERIN 0.4 MG SL tablet Commonly known as: NITROSTAT Place 1 tablet (0.4 mg total) under the tongue every 5 (five) minutes as needed for chest pain.   omeprazole 10 MG capsule Commonly known as: PRILOSEC Take 10 mg by mouth daily.   rosuvastatin 40 MG tablet Commonly known as: CRESTOR Take 1 tablet (40 mg total) by mouth daily.   Trelegy Ellipta 100-62.5-25 MCG/INH Aepb Generic drug: Fluticasone-Umeclidin-Vilant Inhale 1 puff into the lungs daily.       All past medical history, surgical history, allergies, family history, immunizations andmedications were updated in the EMR today and reviewed under the history and medication portions of their EMR.     ROS: Negative, with the exception of above mentioned in HPI   Objective:  BP 102/66   Pulse (!) 52   Temp (!) 97.4 F (36.3 C) (Oral)   Ht 5\' 7"  (1.702 m)   Wt 188 lb (85.3 kg)   LMP  (LMP Unknown)   SpO2 97%   BMI 29.44 kg/m  Body mass index is 29.44 kg/m. Gen: Afebrile. No acute distress.  Nontoxic.  Pleasant.  Female HENT: AT. [place]. Bilateral TM visualized and normal in appearance. MMM.  No cough.  No shortness of breath.  No hoarseness. Eyes:Pupils Equal Round Reactive to light, Extraocular movements intact,  Conjunctiva without redness, discharge or  icterus. Neck/lymp/endocrine: Supple, no lymphadenopathy, no thyromegaly CV: RRR no murmur, no edema Chest: CTAB, no wheeze or crackles Neuro:  Normal gait. PERLA. EOMi. Alert. Oriented x3 Psych: Normal affect, dress and demeanor. Normal speech. Normal thought content and judgment..    No exam data present No results found.  Assessment/Plan: Mary Escobar is a 55 y.o. female present for OV for  Hypertension/HLD-LDL goal less than 70/palpitations/CAD/family history of premature CAD Stable and now off amlodipine.  Continue metoprolol 1/2-1 tab daily.  DC amlodipine 5 mg daily -possibly be part of her lower extremity swelling. Nitro as needed prescribed by cardiology Continue Crestor 40 mg daily prescribed by cardiology Continue daily baby aspirin Continue follow-up with cardiology  Lower extremity edema/dyspnea -with temporary lasix use she lost the 14 pounds gained- shortness of breath remained and echo was normal.  -Potassium supplement use with Lasix prescribed PRN for increase in dry weight > 4 lbs in the morning.  Low-sodium diet encouraged   Headache-dizzines/COPD/headache Still complains of COPD- cardiac causes have been ruled out.  Continue zyrtec qhs Continue nasal saline daily Continue trelegy ellipta and albuterol prn Consider sleep study> referred -Carotid duplex studies > WNL - she is compliant with baby aspirin daily and statin - possibly related to COPD. - encouraged weight loss and smoking cessation.  - referral to pulm  placed  Adenopathy: - reviewed [place] results with her today> benign reactive lymph nodes. She is an everyday smoker, COPD and allergies. Encouraged her to stop smoking and continue allergy regimen.    Reviewed expectations re: course of current medical issues.  Discussed self-management of symptoms.  Outlined signs and symptoms indicating need for more acute intervention.  Patient verbalized understanding and all questions were  answered.  Patient received an After-Visit Summary.    Orders Placed This Encounter  Procedures  . Ambulatory referral to Pulmonology   No orders of the defined types were placed in this encounter.   Referral Orders     Ambulatory referral to Pulmonology   Note is dictated utilizing voice recognition software. Although note has been proof read prior to signing, occasional typographical errors still can be missed. If any questions arise, please do not hesitate to call for verification.   electronically signed by:  Howard Pouch, DO  [place]

## 2021-02-16 ENCOUNTER — Ambulatory Visit (AMBULATORY_SURGERY_CENTER): Payer: 59 | Admitting: Gastroenterology

## 2021-02-16 ENCOUNTER — Encounter: Payer: Self-pay | Admitting: Gastroenterology

## 2021-02-16 ENCOUNTER — Other Ambulatory Visit: Payer: Self-pay

## 2021-02-16 VITALS — BP 121/63 | HR 61 | Temp 98.3°F | Resp 15 | Ht 67.0 in | Wt 188.0 lb

## 2021-02-16 DIAGNOSIS — D120 Benign neoplasm of cecum: Secondary | ICD-10-CM

## 2021-02-16 DIAGNOSIS — K635 Polyp of colon: Secondary | ICD-10-CM | POA: Diagnosis not present

## 2021-02-16 DIAGNOSIS — D123 Benign neoplasm of transverse colon: Secondary | ICD-10-CM | POA: Diagnosis not present

## 2021-02-16 DIAGNOSIS — Z1211 Encounter for screening for malignant neoplasm of colon: Secondary | ICD-10-CM

## 2021-02-16 DIAGNOSIS — D124 Benign neoplasm of descending colon: Secondary | ICD-10-CM

## 2021-02-16 DIAGNOSIS — Z8371 Family history of colonic polyps: Secondary | ICD-10-CM

## 2021-02-16 DIAGNOSIS — Z800 Family history of malignant neoplasm of digestive organs: Secondary | ICD-10-CM

## 2021-02-16 MED ORDER — SODIUM CHLORIDE 0.9 % IV SOLN: 500.0000 mL | Freq: Once | INTRAVENOUS | Status: DC

## 2021-02-16 NOTE — Patient Instructions (Signed)
Handouts Provided:  Polyps  YOU HAD AN ENDOSCOPIC PROCEDURE TODAY AT THE [facility]:   Refer to the procedure report that was given to you for any specific questions about what was found during the examination.  If the procedure report does not answer your questions, please call your gastroenterologist to clarify.  If you requested that your care partner not be given the details of your procedure findings, then the procedure report has been included in a sealed envelope for you to review at your convenience later.  YOU SHOULD EXPECT: Some feelings of bloating in the abdomen. Passage of more gas than usual.  Walking can help get rid of the air that was put into your GI tract during the procedure and reduce the bloating. If you had a lower endoscopy (such as a colonoscopy or flexible sigmoidoscopy) you may notice spotting of blood in your stool or on the toilet paper. If you underwent a bowel prep for your procedure, you may not have a normal bowel movement for a few days.  Please Note:  You might notice some irritation and congestion in your nose or some drainage.  This is from the oxygen used during your procedure.  There is no need for concern and it should clear up in a day or so.  SYMPTOMS TO REPORT IMMEDIATELY:   Following lower endoscopy (colonoscopy or flexible sigmoidoscopy):  Excessive amounts of blood in the stool  Significant tenderness or worsening of abdominal pains  Swelling of the abdomen that is new, acute  Fever of 100F or higher  For urgent or emergent issues, a gastroenterologist can be reached at any hour by calling [phone]. Do not use MyChart messaging for urgent concerns.    DIET:  We do recommend a small meal at first, but then you may proceed to your regular diet.  Drink plenty of fluids but you should avoid alcoholic beverages for 24 hours.  ACTIVITY:  You should plan to take it easy for the rest of today and you should NOT DRIVE or use heavy  machinery until tomorrow (because of the sedation medicines used during the test).    FOLLOW UP: Our staff will call the number listed on your records 48-72 hours following your procedure to check on you and address any questions or concerns that you may have regarding the information given to you following your procedure. If we do not reach you, we will leave a message.  We will attempt to reach you two times.  During this call, we will ask if you have developed any symptoms of COVID 19. If you develop any symptoms (ie: fever, flu-like symptoms, shortness of breath, cough etc.) before then, please call [phone].  If you test positive for Covid 19 in the 2 weeks post procedure, please call and report this information to us.    If any biopsies were taken you will be contacted by phone or by letter within the next 1-3 weeks.  Please call us at [phone] if you have not heard about the biopsies in 3 weeks.    SIGNATURES/CONFIDENTIALITY: You and/or your care partner have signed paperwork which will be entered into your electronic medical record.  These signatures attest to the fact that that the information above on your After Visit Summary has been reviewed and is understood.  Full responsibility of the confidentiality of this discharge information lies with you and/or your care-partner.  

## 2021-02-16 NOTE — Progress Notes (Signed)
Report to PACU, RN, vss, BBS= Clear.  

## 2021-02-16 NOTE — Op Note (Signed)
[facility] Patient Name: Mary Escobar Procedure Date: 02/16/2021 10:22 AM MRN: [id number] Endoscopist: Thornton Park MD, MD Age: 55 Referring MD:  Date of Birth: [birth year] Gender: Female Account #: [id number] Procedure:                Colonoscopy Indications:              Screening for colorectal malignant neoplasm, This                            is the patient's first colonoscopy                           Father with a history of colon polyps, one                            requiring surgery                           Maternal aunt with colon cancer Medicines:                Monitored Anesthesia Care Procedure:                Pre-Anesthesia Assessment:                           - Prior to the procedure, a History and Physical                            was performed, and patient medications and                            allergies were reviewed. The patient's tolerance of                            previous anesthesia was also reviewed. The risks                            and benefits of the procedure and the sedation                            options and risks were discussed with the patient.                            All questions were answered, and informed consent                            was obtained. Prior Anticoagulants: The patient has                            taken no previous anticoagulant or antiplatelet                            agents. ASA Grade Assessment: II - A patient with  mild systemic disease. After reviewing the risks                            and benefits, the patient was deemed in                            satisfactory condition to undergo the procedure.                           After obtaining informed consent, the colonoscope                            was passed under direct vision. Throughout the                            procedure, the patient's blood pressure, pulse, and                             oxygen saturations were monitored continuously. The                            Olympus [id number]) Colonoscope was                            introduced through the anus and advanced to the 3                            cm into the ileum. A second forward view of the                            right colon was performed. The colonoscopy was                            performed without difficulty. The patient tolerated                            the procedure well. The quality of the bowel                            preparation was good. The terminal ileum, ileocecal                            valve, appendiceal orifice, and rectum were                            photographed. Scope In: 10:30:35 AM Scope Out: 10:52:00 AM Scope Withdrawal Time: 0 hours 17 minutes 24 seconds  Total Procedure Duration: 0 hours 21 minutes 25 seconds  Findings:                 The perianal and digital rectal examinations were                            normal.  Two flat polyps were found in the transverse colon.                            The polyps were less than 1 mm in size. These                            polyps were removed with a cold biopsy forceps.                            Resection and retrieval were complete. Estimated                            blood loss was minimal.                           A 2 mm polyp was found in the transverse colon. The                            polyp was sessile. The polyp was removed with a                            cold snare. Resection and retrieval were complete.                            Estimated blood loss was minimal.                           A 3 mm polyp was found in the descending colon. The                            polyp was sessile. The polyp was removed with a                            cold snare. Resection and retrieval were complete.                            Estimated blood loss was minimal.                            A 2 mm polyp was found in the proximal transverse                            colon. The polyp was sessile. The polyp was removed                            with a cold snare. Resection and retrieval were                            complete. Estimated blood loss was minimal.                           A 2 mm polyp was found in the cecum. The  polyp was                            sessile. The polyp was removed with a cold snare.                            Resection and retrieval were complete. Estimated                            blood loss was minimal.                           The exam was otherwise without abnormality on                            direct and retroflexion views except for small                            internal hemorrhoids. Complications:            No immediate complications. Estimated blood loss:                            Minimal. Estimated Blood Loss:     Estimated blood loss was minimal. Impression:               - Two less than 1 mm polyps in the transverse                            colon, removed with a cold biopsy forceps. Resected                            and retrieved.                           - One 2 mm polyp in the transverse colon, removed                            with a cold snare. Resected and retrieved.                           - One 3 mm polyp in the descending colon, removed                            with a cold snare. Resected and retrieved.                           - One 2 mm polyp in the proximal transverse colon,                            removed with a cold snare. Resected and retrieved.                           - One 2 mm polyp in the cecum, removed with a cold  snare. Resected and retrieved.                           - The examination was otherwise normal on direct                            and retroflexion views. Recommendation:           - Patient has a contact number available for                             emergencies. The signs and symptoms of potential                            delayed complications were discussed with the                            patient. Return to normal activities tomorrow.                            Written discharge instructions were provided to the                            patient.                           - Resume previous diet.                           - Continue present medications.                           - Await pathology results.                           - Repeat colonoscopy date to be determined after                            pending pathology results are reviewed for                            surveillance.                           - Emerging evidence supports eating a diet of                            fruits, vegetables, grains, calcium, and yogurt                            while reducing red meat and alcohol may reduce the                            risk of colon cancer.                           - Thank you for allowing  me to be involved in your                            colon cancer prevention. Thornton Park MD, MD 02/16/2021 11:03:51 AM This report has been signed electronically.

## 2021-02-16 NOTE — Progress Notes (Signed)
VS by Clayton. 

## 2021-02-16 NOTE — Progress Notes (Signed)
Called to room to assist during endoscopic procedure.  Patient ID and intended procedure confirmed with present staff. Received instructions for my participation in the procedure from the performing physician.  

## 2021-02-20 ENCOUNTER — Telehealth: Payer: Self-pay

## 2021-02-20 NOTE — Telephone Encounter (Signed)
   Follow up Call-  Call back number 02/16/2021  Post procedure Call Back phone  # [phone]  Permission to leave phone message Yes  Some recent data might be hidden     Patient questions:  Do you have a fever, pain , or abdominal swelling? No. Pain Score  0 *  Have you tolerated food without any problems? Yes.    Have you been able to return to your normal activities? Yes.    Do you have any questions about your discharge instructions: Diet   No. Medications  No. Follow up visit  No.  Do you have questions or concerns about your Care? No.  Actions: * If pain score is 4 or above: No action needed, pain <4. 1. Have you developed a fever since your procedure? no  2.   Have you had an respiratory symptoms (SOB or cough) since your procedure? no  3.   Have you tested positive for COVID 19 since your procedure no  4.   Have you had any family members/close contacts diagnosed with the COVID 19 since your procedure?  no   If yes to any of these questions please route to Joylene John, RN and Joella Prince, RN

## 2021-02-28 ENCOUNTER — Institutional Professional Consult (permissible substitution): Payer: 59 | Admitting: Pulmonary Disease

## 2021-03-05 ENCOUNTER — Encounter: Payer: Self-pay | Admitting: Gastroenterology

## 2021-03-08 ENCOUNTER — Other Ambulatory Visit: Payer: Self-pay | Admitting: Medical

## 2021-03-19 ENCOUNTER — Institutional Professional Consult (permissible substitution): Payer: 59 | Admitting: Pulmonary Disease

## 2021-03-29 ENCOUNTER — Ambulatory Visit: Payer: 59 | Admitting: Podiatry

## 2021-03-29 ENCOUNTER — Ambulatory Visit (INDEPENDENT_AMBULATORY_CARE_PROVIDER_SITE_OTHER): Payer: 59

## 2021-03-29 ENCOUNTER — Encounter: Payer: Self-pay | Admitting: Podiatry

## 2021-03-29 ENCOUNTER — Other Ambulatory Visit: Payer: Self-pay

## 2021-03-29 DIAGNOSIS — M2141 Flat foot [pes planus] (acquired), right foot: Secondary | ICD-10-CM | POA: Diagnosis not present

## 2021-03-29 DIAGNOSIS — M2142 Flat foot [pes planus] (acquired), left foot: Secondary | ICD-10-CM

## 2021-03-29 DIAGNOSIS — M216X1 Other acquired deformities of right foot: Secondary | ICD-10-CM | POA: Diagnosis not present

## 2021-03-29 DIAGNOSIS — Q742 Other congenital malformations of lower limb(s), including pelvic girdle: Secondary | ICD-10-CM

## 2021-03-29 DIAGNOSIS — M79673 Pain in unspecified foot: Secondary | ICD-10-CM | POA: Diagnosis not present

## 2021-03-29 DIAGNOSIS — M216X2 Other acquired deformities of left foot: Secondary | ICD-10-CM

## 2021-03-29 DIAGNOSIS — M21861 Other specified acquired deformities of right lower leg: Secondary | ICD-10-CM

## 2021-03-29 DIAGNOSIS — M21862 Other specified acquired deformities of left lower leg: Secondary | ICD-10-CM
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
## 2021-04-08 ENCOUNTER — Ambulatory Visit
Admission: RE | Admit: 2021-04-08 | Discharge: 2021-04-08 | Disposition: A | Discharge status: Home / Self Care | Payer: 59 | Source: Ambulatory Visit | Attending: Podiatry | Admitting: Podiatry

## 2021-04-08 DIAGNOSIS — M2142 Flat foot [pes planus] (acquired), left foot: Secondary | ICD-10-CM

## 2021-04-08 DIAGNOSIS — Q742 Other congenital malformations of lower limb(s), including pelvic girdle: Secondary | ICD-10-CM

## 2021-04-08 DIAGNOSIS — M79673 Pain in unspecified foot: Secondary | ICD-10-CM

## 2021-04-08 DIAGNOSIS — M2141 Flat foot [pes planus] (acquired), right foot: Secondary | ICD-10-CM

## 2021-04-08 IMAGING — MR MR ANKLE*L* W/O CM
5 series · 37 of 40 positions shown · non-contrast
Comparison: Plain films left foot [DATE].

CLINICAL DATA: Chronic medial left ankle pain.

EXAM:
MRI OF THE LEFT ANKLE WITHOUT CONTRAST
TECHNIQUE: Multiplanar, multisequence MR imaging of the ankle was performed. No
intravenous contrast was administered.

[Series 4: T2 fat-sat · axial · 3.0mm · 0.50mm/px · z∈[-51,+69]mm · 9 of 32 slices shown (1 of 2)]
[im 1/32]
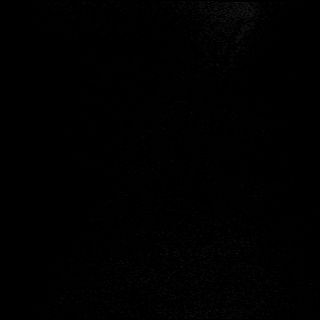
[im 4/32]
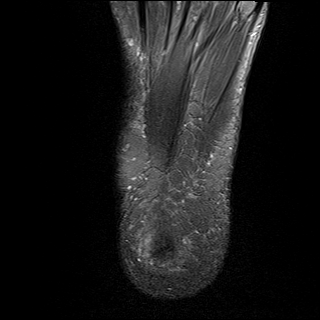
[im 8/32]
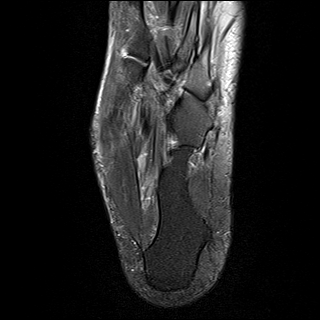
[im 12/32]
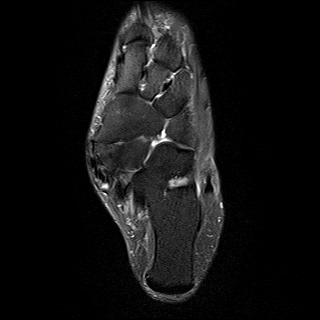
[im 16/32]
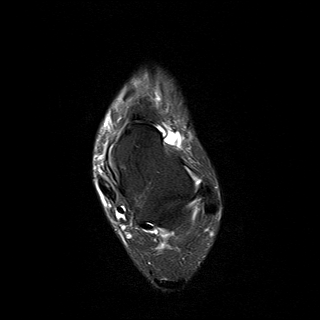
[im 20/32]
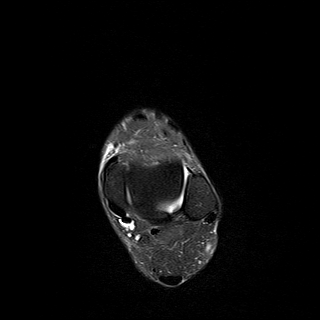
[im 24/32]
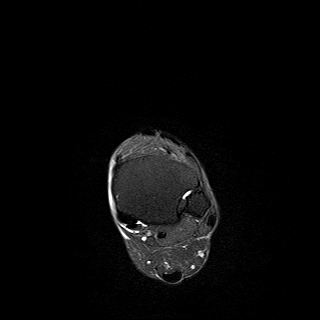
[im 28/32]
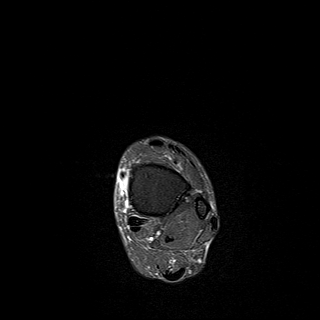
[im 32/32]
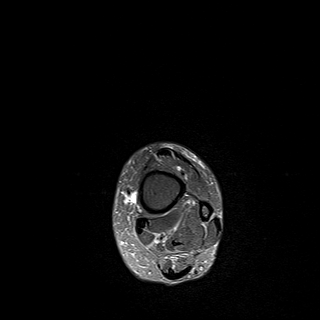

[Series 5: PD fat-sat · axial · 3.0mm · 0.42mm/px · z∈[-51,+69]mm · 9 of 32 slices shown]
[im 1/32]
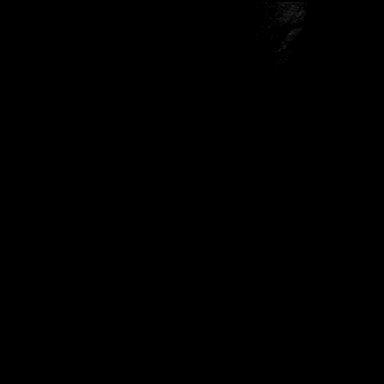
[im 4/32]
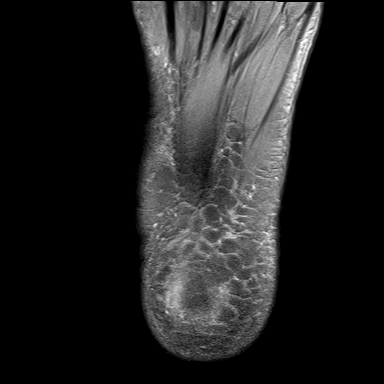
[im 8/32]
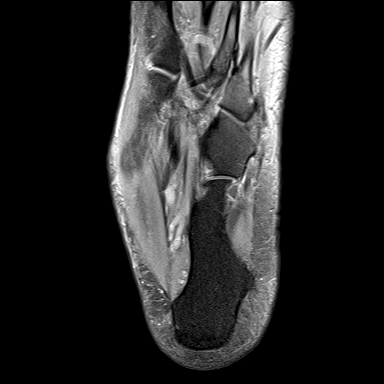
[im 12/32]
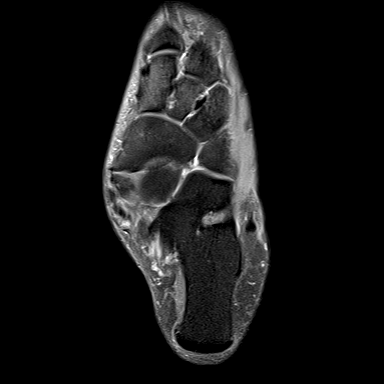
[im 16/32]
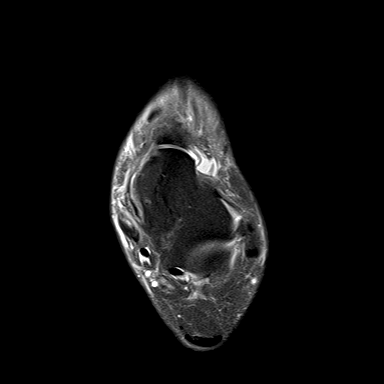
[im 20/32]
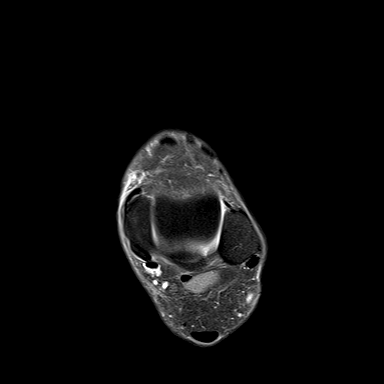
[im 24/32]
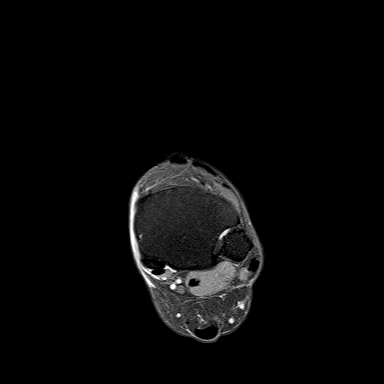
[im 28/32]
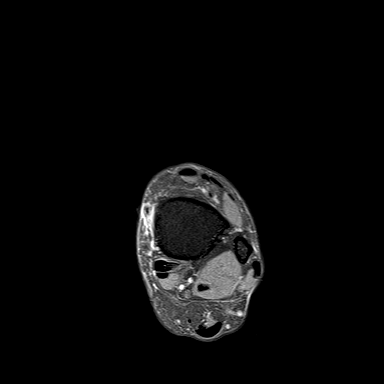
[im 32/32]
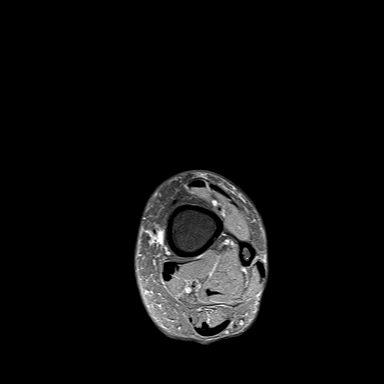

[Series 6: T1 · sagittal · 4.0mm · 0.56mm/px · 6 of 20 slices shown]
[im 1/20]
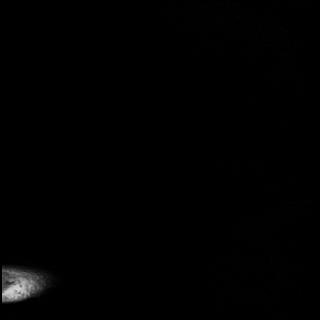
[im 4/20]
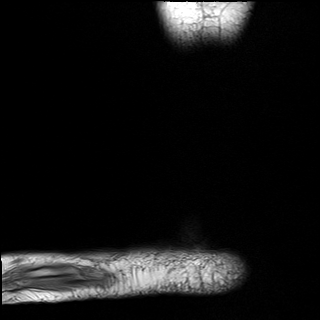
[im 8/20]
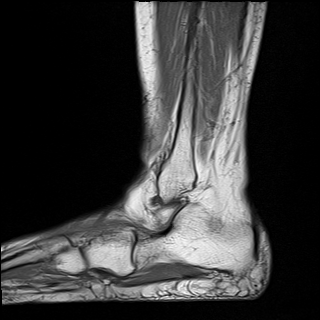
[im 12/20]
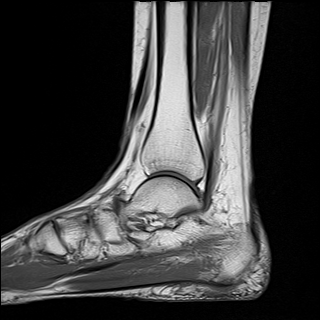
[im 16/20]
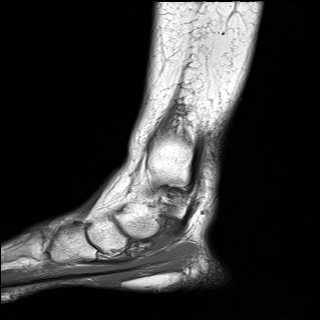
[im 20/20]
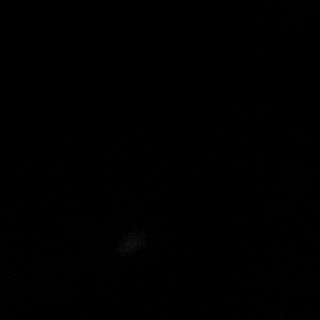

[Series 7: STIR · sagittal · 4.0mm · 0.35mm/px · 5 of 20 slices shown]
[im 1/20]
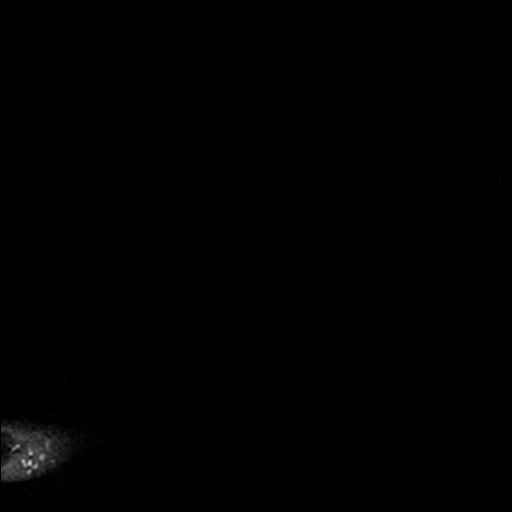
[im 4/20]
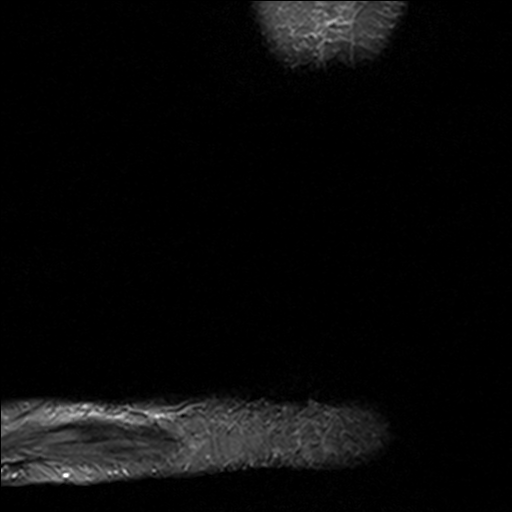
[im 8/20]
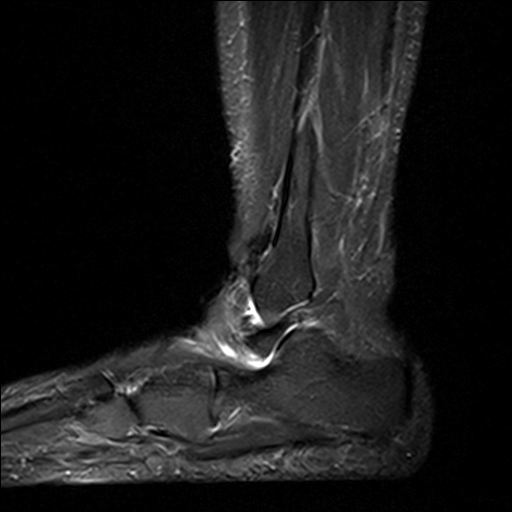
[im 12/20]
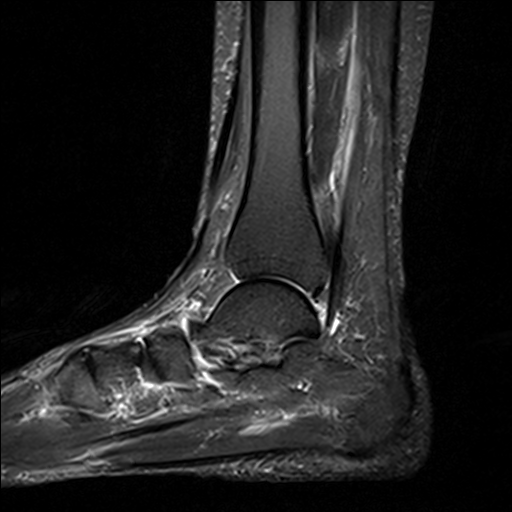
[im 16/20]
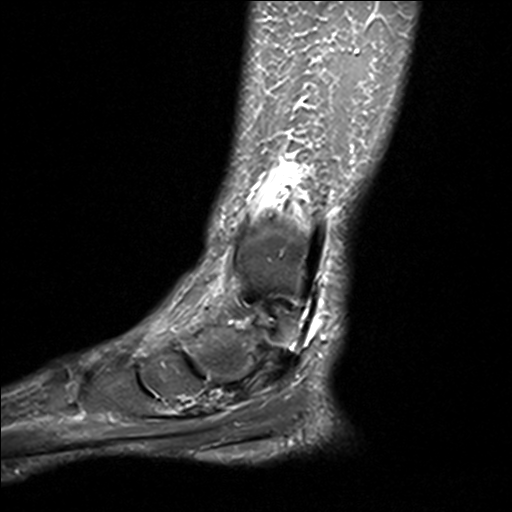

[Series 8: T2 fat-sat · coronal · 3.0mm · 0.50mm/px · 8 of 35 slices shown (2 of 2)]
[im 1/35]
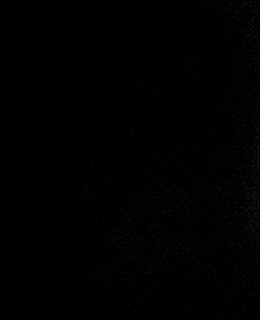
[im 4/35]
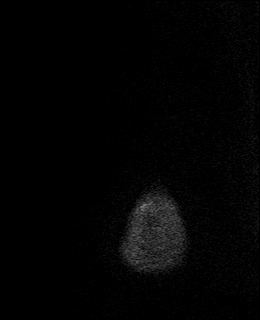
[im 12/35]
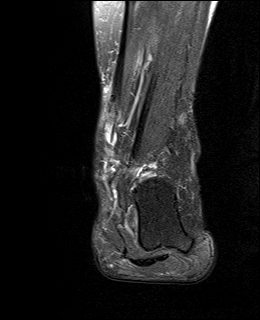
[im 16/35]
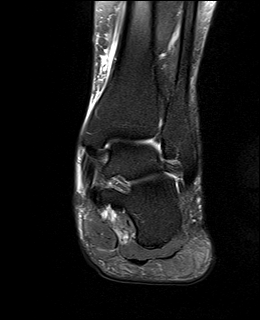
[im 19/35]
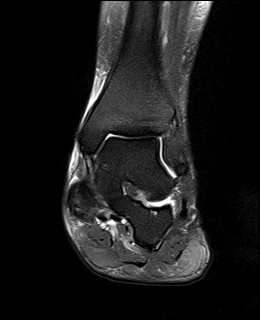
[im 23/35]
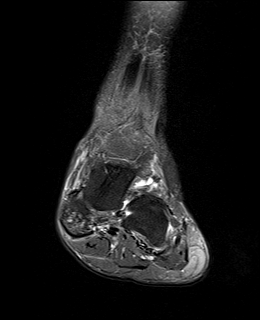
[im 31/35]
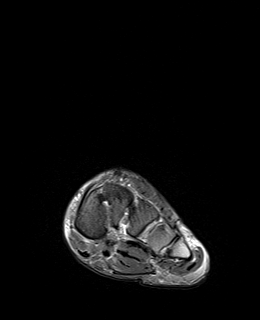
[im 35/35]
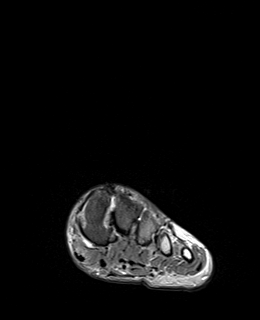

[37 of 40 positions shown; findings below may reference images not displayed]

FINDINGS: TENDONS

Peroneal: Intact.

Posteromedial: Mild intrasubstance increased T2 signal is seen in
the tibialis posterior consistent with tendinosis. No tear. There is
a small volume of fluid in the flexor digitorum longus tendon
sheath. The flexor hallucis longus is negative.

Anterior: Intact.

Achilles: Intact.

Plantar Fascia: Intact.  Negative for plantar fasciitis.

LIGAMENTS

Lateral: Intact.

Medial: Intact.

CARTILAGE

Ankle Joint: Normal. No osteochondral lesion of the talar dome or
joint effusion.

Subtalar Joints/Sinus Tarsi: Normal.

Bones: Type 2 accessory ossicle of the navicular is present with
degenerative change about the synchondrosis where there are multiple
small subchondral cysts. No fracture, stress change or worrisome
lesion. Pes planus is better visualized on the prior plain films.

Other: None.  No fluid collection or mass.
IMPRESSION: PTT tendinosis without tear.

Degenerative change about a type 2 accessory ossicle of the
navicular could be a source of medial ankle pain.

Small volume of fluid in the flexor digitorum longus tendon sheath
suggestive of tenosynovitis without tear.

## 2021-04-08 IMAGING — MR MR ANKLE*R* W/O CM
5 series · 40 of 40 positions shown · non-contrast
Comparison: Plain films of the right foot [DATE].

CLINICAL DATA: Chronic bilateral foot and ankle pain. No known
injury.

EXAM:
MRI OF THE RIGHT ANKLE WITHOUT CONTRAST
TECHNIQUE: Multiplanar, multisequence MR imaging of the ankle was performed. No
intravenous contrast was administered.

[Series 4: T2 fat-sat · axial · 3.0mm · 0.50mm/px · z∈[-63,+58]mm · 10 of 32 slices shown (1 of 2)]
[im 1/32]
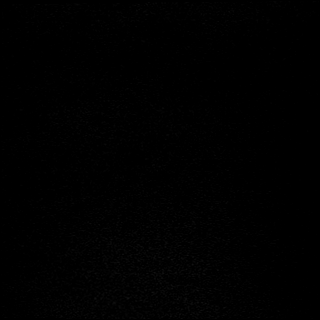
[im 4/32]
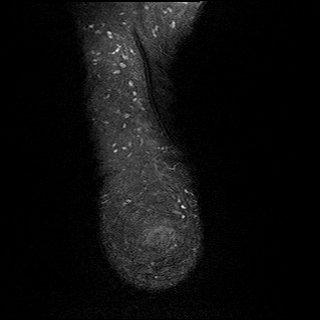
[im 7/32]
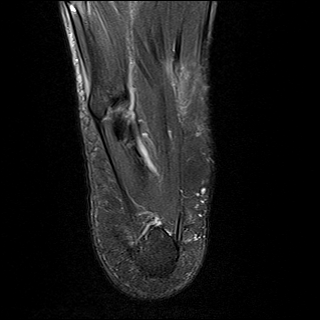
[im 11/32]
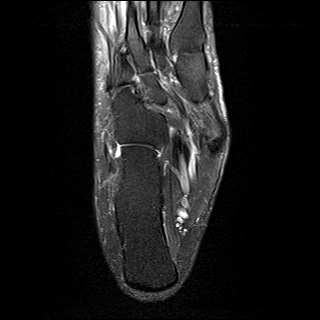
[im 14/32]
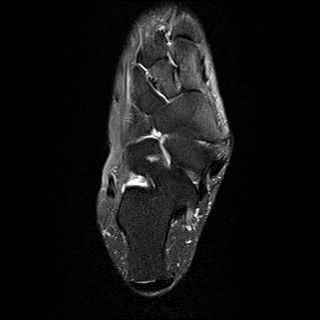
[im 18/32]
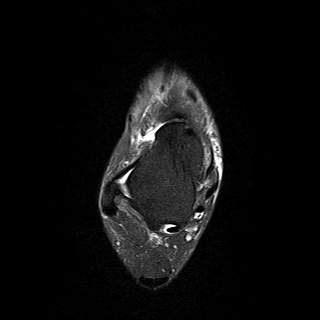
[im 21/32]
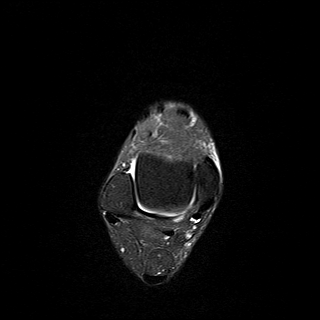
[im 25/32]
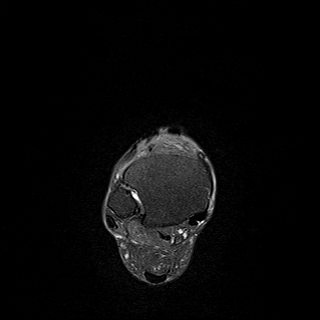
[im 28/32]
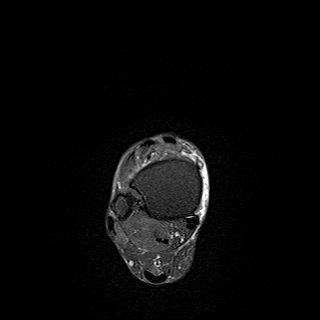
[im 32/32]
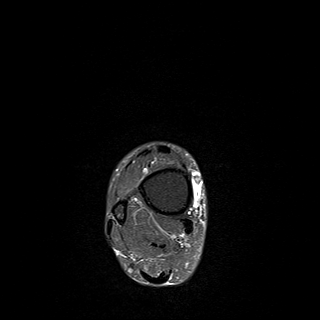

[Series 5: PD fat-sat · axial · 3.0mm · 0.50mm/px · z∈[-63,+58]mm · 9 of 32 slices shown]
[im 1/32]
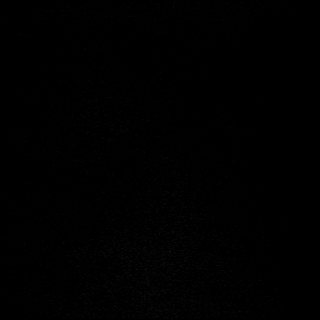
[im 4/32]
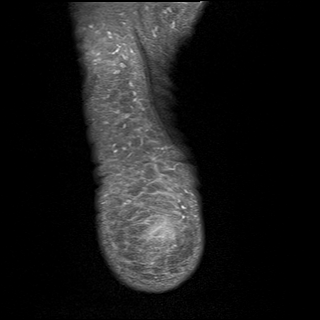
[im 8/32]
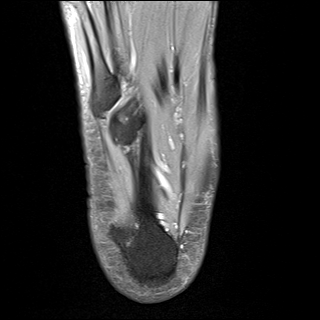
[im 12/32]
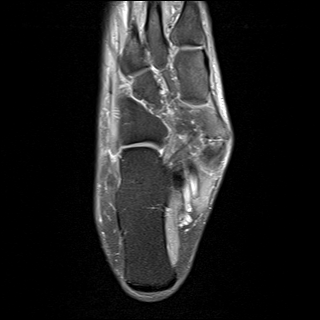
[im 16/32]
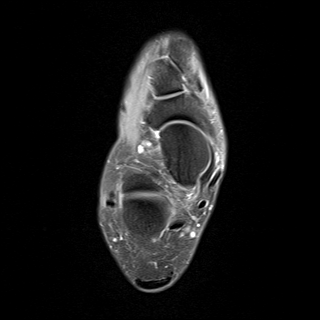
[im 20/32]
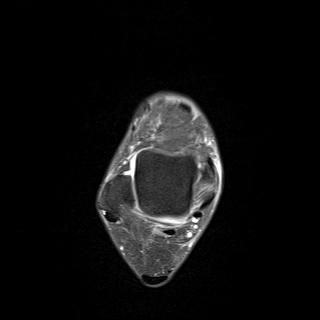
[im 24/32]
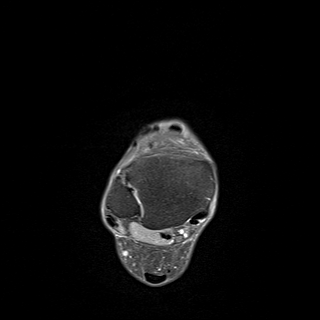
[im 28/32]
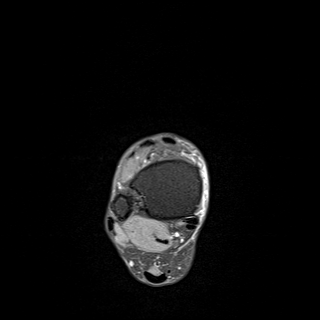
[im 32/32]
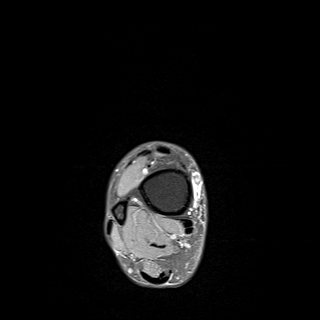

[Series 6: T1 · sagittal · 4.0mm · 0.56mm/px · 5 of 19 slices shown]
[im 1/19]
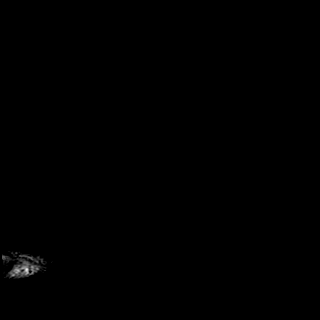
[im 5/19]
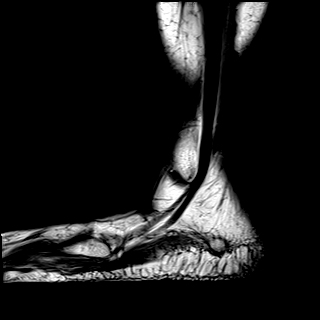
[im 10/19]
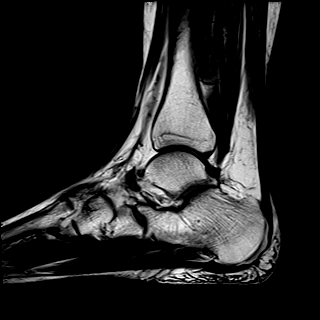
[im 14/19]
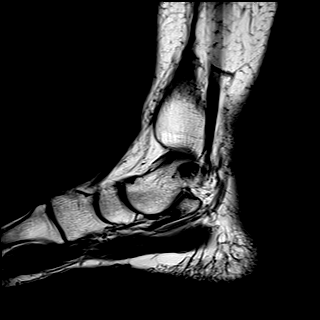
[im 19/19]
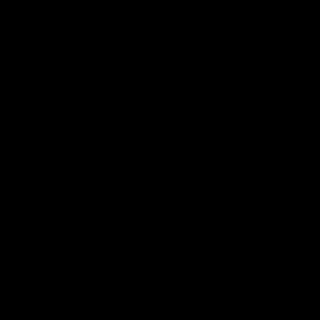

[Series 7: STIR · sagittal · 4.0mm · 0.35mm/px · 5 of 19 slices shown]
[im 1/19]
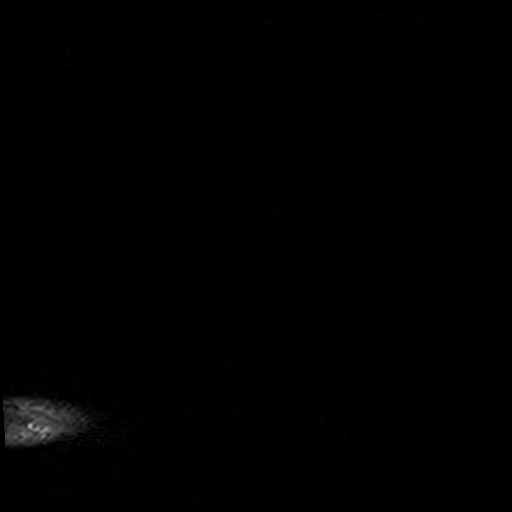
[im 5/19]
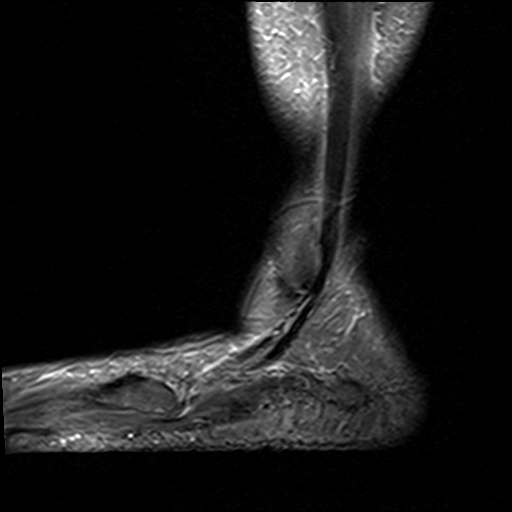
[im 10/19]
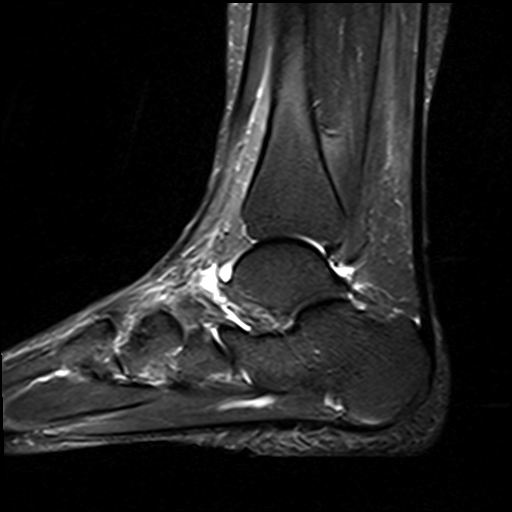
[im 14/19]
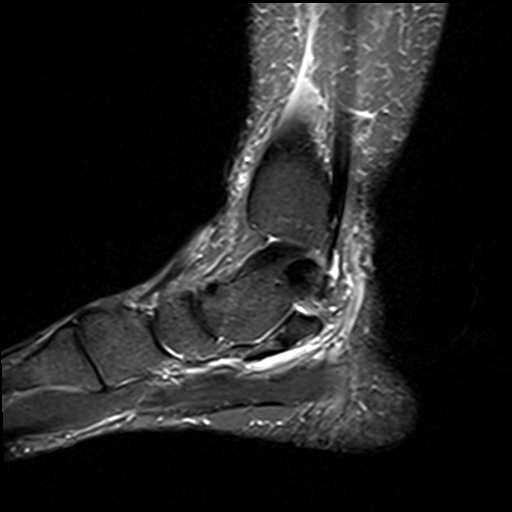
[im 19/19]
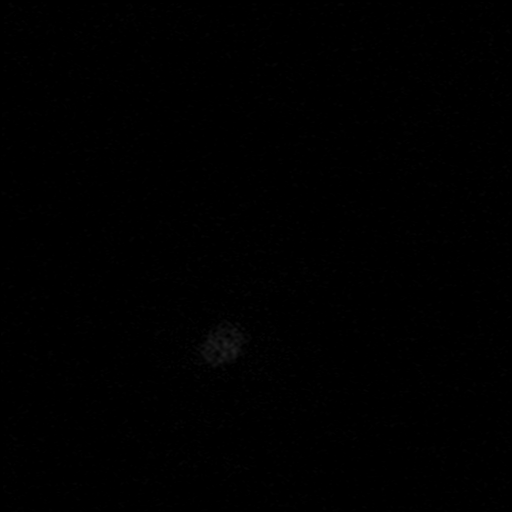

[Series 8: T2 fat-sat · coronal · 3.0mm · 0.50mm/px · 11 of 39 slices shown (2 of 2)]
[im 1/39]
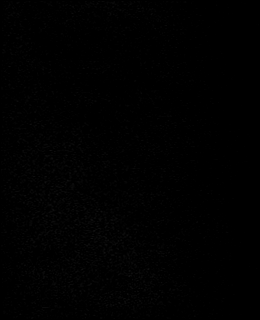
[im 4/39]
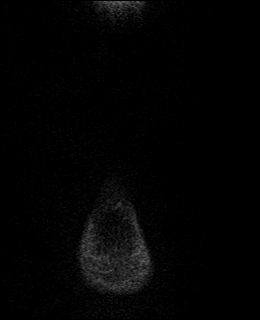
[im 8/39]
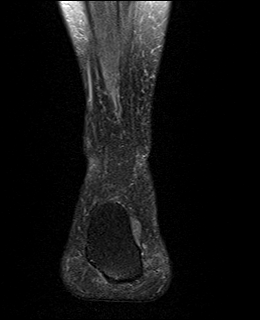
[im 12/39]
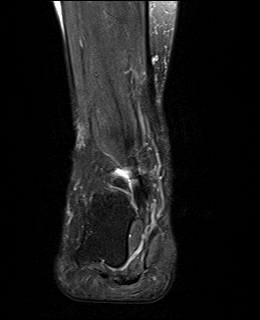
[im 16/39]
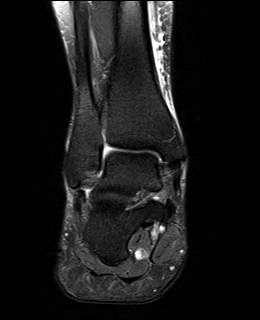
[im 20/39]
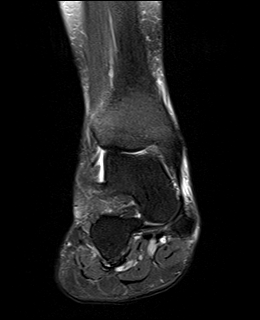
[im 23/39]
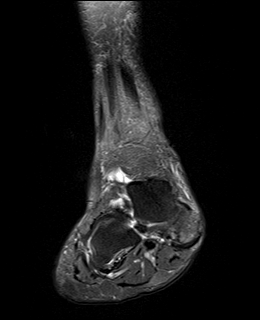
[im 27/39]
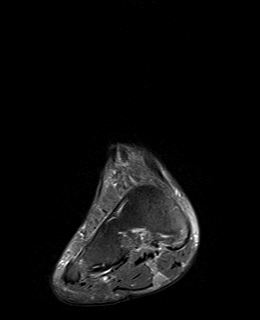
[im 31/39]
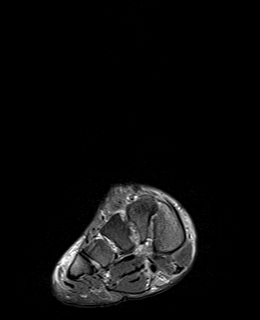
[im 35/39]
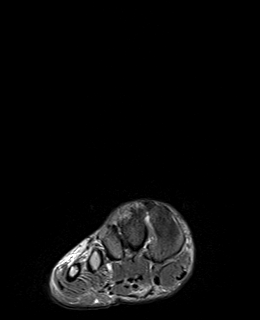
[im 39/39]
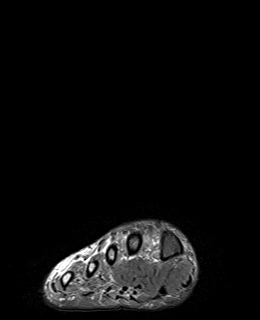

[40 of 40 positions shown; findings below may reference images not displayed]

FINDINGS: TENDONS

Peroneal: Intact.

Posteromedial: Intact.

Anterior: Intact.

Achilles: Intact.

Plantar Fascia: Intact. There is mild edema in the medial cord and
in the calcaneus at the attachment site of the medial cord
consistent with plantar fasciitis.

LIGAMENTS

Lateral: Intact.

Medial: Intact.

CARTILAGE

Ankle Joint: Normal. No osteochondral lesion of the talar dome or
joint effusion.

Subtalar Joints/Sinus Tarsi: Normal.

Bones: The patient has a type 2 accessory ossicle of the navicular.
There is some degenerative change about the synchondrosis where
there is mild subchondral edema and osteophytosis. No fracture,
stress change or worrisome lesion.

Other: None.
IMPRESSION: Type 2 accessory ossicle of the navicular with degenerative change
about the synchondrosis could be a source of medial ankle pain.

Very mild appearing plantar fasciitis of the medial cord.

## 2021-04-17 ENCOUNTER — Encounter: Payer: Self-pay | Admitting: Family Medicine

## 2021-04-17 NOTE — Telephone Encounter (Signed)
Please advise 

## 2021-04-17 NOTE — Telephone Encounter (Signed)
They have to be able to tell her what is on their formulary for COPD.  This is their responsibility as her insurance company to provide her with a formulary medication list. Some examples are Pulmicort, Symbicort, Advair, Breo etc. there are many inhalers to choose from-we need to know what is on her formulary.  Thanks.

## 2021-04-18 NOTE — Telephone Encounter (Signed)
FYI

## 2021-05-01 ENCOUNTER — Other Ambulatory Visit: Payer: Self-pay

## 2021-05-01 ENCOUNTER — Ambulatory Visit (INDEPENDENT_AMBULATORY_CARE_PROVIDER_SITE_OTHER): Payer: 59 | Admitting: Podiatry

## 2021-05-01 ENCOUNTER — Encounter: Payer: Self-pay | Admitting: Podiatry

## 2021-05-01 DIAGNOSIS — M722 Plantar fascial fibromatosis: Secondary | ICD-10-CM

## 2021-05-01 MED ORDER — METHYLPREDNISOLONE 4 MG PO TBPK: ORAL_TABLET | ORAL | 0 refills | Status: DC

## 2021-05-01 MED ORDER — TRIAMCINOLONE ACETONIDE 40 MG/ML IJ SUSP
40.0000 mg | Freq: Once | INTRAMUSCULAR | Status: AC
  Administered 2021-05-01: 40 mg

## 2021-05-02 NOTE — Progress Notes (Signed)
Subjective:  Patient ID: Mary Escobar, female    DOB: [birth year],  MRN: [id number] HPI Chief Complaint  Patient presents with   Foot Pain    Follow up pes planus/MRI/2nd opinion    54 y.o. female presents with the above complaint.   ROS: Denies fever chills nausea vomiting muscle aches pains calf pain back pain chest pain shortness of breath.  She works for [employer].  States that she has numbness and tingling along the medial and plantar aspect of the feet bilaterally even when she is sitting in bed.  Past Medical History:  Diagnosis Date   Allergy    Anxiety    Arthritis    Chronic constipation 01/11/2020   Chronic kidney disease    kidney stones   COPD (chronic obstructive pulmonary disease) (HCC)    DOE (dyspnea on exertion) 01/11/2020   Fainting spell    Family history of premature CAD    Fatigue 2020   multifactoral   GERD (gastroesophageal reflux disease)    History of kidney stones    Hot flashes 02/11/2020   Hyperlipidemia    Hypertension    Hypothyroid    IBS (irritable bowel syndrome)    Kidney stone 08/18/2019   mild nonobstructive CAD on cath 2009    Osteoporosis    Other complicated headache syndrome 12/21/2020   Palpitation    Polyarthralgia 12/08/2018   Smoker    Snoring 09/08/2018   Vitamin D deficiency    Past Surgical History:  Procedure Laterality Date   CORONARY ANGIOGRAM  2009   mild CAD noted after abn GXT   CYSTOSCOPY W/ URETERAL STENT PLACEMENT Right 03/31/2020   Procedure: CYSTOSCOPY WITH STENT PLACEMENT;  Surgeon: Alexis Frock, MD;  Location: [facility];  Service: Urology;  Laterality: Right;   CYSTOSCOPY WITH RETROGRADE PYELOGRAM, URETEROSCOPY AND STENT PLACEMENT Right 04/21/2020   Procedure: CYSTOSCOPY WITH RETROGRADE PYELOGRAM, RIGHT URETEROSCOPY;  Surgeon: Alexis Frock, MD;  Location: [facility];  Service: Urology;  Laterality: Right;  1 HR   HOLMIUM LASER APPLICATION Right 0000000   Procedure: HOLMIUM LASER APPLICATION;   Surgeon: Alexis Frock, MD;  Location: [facility];  Service: Urology;  Laterality: Right;   WRIST SURGERY      Current Outpatient Medications:    methylPREDNISolone (MEDROL DOSEPAK) 4 MG TBPK tablet, 6 day dose pack - take as directed, Disp: 21 tablet, Rfl: 0   aspirin EC 81 MG tablet, Take 81 mg by mouth daily. Swallow whole., Disp: , Rfl:    BIOTIN 5000 PO, Take by mouth., Disp: , Rfl:    buPROPion (WELLBUTRIN XL) 300 MG 24 hr tablet, Take 1 tablet (300 mg total) by mouth daily., Disp: 90 tablet, Rfl: 1   cetirizine (ZYRTEC) 10 MG tablet, Take 1 tablet (10 mg total) by mouth daily., Disp: 90 tablet, Rfl: 4   Cholecalciferol (D3-1000 PO), Take by mouth., Disp: , Rfl:    cyanocobalamin 1000 MCG tablet, Take 1,000 mcg by mouth daily., Disp: , Rfl:    ELDERBERRY PO, Take 1 tablet by mouth daily., Disp: , Rfl:    escitalopram (LEXAPRO) 5 MG tablet, Take 1 tablet (5 mg total) by mouth daily., Disp: 90 tablet, Rfl: 1   fluticasone (FLONASE) 50 MCG/ACT nasal spray, Place 2 sprays into both nostrils in the morning and at bedtime. (Patient taking differently: Place 2 sprays into both nostrils in the morning and at bedtime. Takes at bedtime now), Disp: 16 g, Rfl: 6   Fluticasone-Umeclidin-Vilant (TRELEGY ELLIPTA) 100-62.5-25 MCG/INH  AEPB, Inhale 1 puff into the lungs daily., Disp: 60 each, Rfl: 5   levothyroxine (SYNTHROID) 75 MCG tablet, Take 1 tablet (75 mcg total) by mouth daily. Prior scripts- new provider, Disp: 90 tablet, Rfl: 3   MAGNESIUM PO, Take 500 mg by mouth daily., Disp: , Rfl:    metoprolol succinate (TOPROL-XL) 25 MG 24 hr tablet, Take 0.5-1 tablets (12.5-25 mg total) by mouth daily., Disp: 90 tablet, Rfl: 1   nitroGLYCERIN (NITROSTAT) 0.4 MG SL tablet, Place 1 tablet (0.4 mg total) under the tongue every 5 (five) minutes as needed for chest pain., Disp: 25 tablet, Rfl: 4   omeprazole (PRILOSEC) 10 MG capsule, Take 10 mg by mouth daily., Disp: , Rfl:    rosuvastatin (CRESTOR) 40 MG tablet,  Take 1 tablet (40 mg total) by mouth daily., Disp: 90 tablet, Rfl: 3  Allergies  Allergen Reactions   Citalopram Palpitations   Ciprofloxacin Nausea And Vomiting   Effexor [Venlafaxine]     nausea   Sulfonamide Derivatives Rash    REACTION: unknown rx   Review of Systems Objective:  There were no vitals filed for this visit.  General: Well developed, nourished, in no acute distress, alert and oriented x3   Dermatological: Skin is warm, dry and supple bilateral. Nails x 10 are well maintained; remaining integument appears unremarkable at this time. There are no open sores, no preulcerative lesions, no rash or signs of infection present.  Vascular: Dorsalis Pedis artery and Posterior Tibial artery pedal pulses are 2/4 bilateral with immedate capillary fill time. Pedal hair growth present. No varicosities and no lower extremity edema present bilateral.   Neruologic: Grossly intact via light touch bilateral. Vibratory intact via tuning fork bilateral. Protective threshold with Semmes Wienstein monofilament intact to all pedal sites bilateral. Patellar and Achilles deep tendon reflexes 2+ bilateral. No Babinski or clonus noted bilateral.  She has some tenderness on palpation of the tarsal tunnel.  With palpation of the tarsal tunnel she has an increase in the tingling in the bottom of the foot along the plantar medial branch.  Musculoskeletal: No gross boney pedal deformities bilateral. No pain, crepitus, or limitation noted with foot and ankle range of motion bilateral. Muscular strength 5/5 in all groups tested bilateral.  She has palpable masses to the medial aspect of the navicular tuberosity.  She has pain on palpation medial calcaneal tubercles bilateral.  MRI taken does demonstrate type II os naviculare with few other symptoms and signs.  Gait: Unassisted, Nonantalgic.    Radiographs:  MRI was reviewed today.  Assessment & Plan:   Assessment: She has painful os naviculare  bilateral.  Possible tarsal tunnel bilateral.  Planter fasciitis bilateral.  Plan: Discussed etiology pathology conservative surgical therapies with her.  At this point we are going to treat the plantar fascia with injections bilateral 20 mg Kenalog 5 mg of Marcaine.  We will start her on methylprednisolone and she will follow-up with mobic which she already has at home.  I would like to follow-up with her in 1 month for this.  We also discussed appropriate shoe gear stretching exercises and ice therapy.  Should this fail to alleviate her symptoms we may need to consider nerve conduction velocity exams from neurology.  Should this fail to produce any findings surgery would be necessary to remove the os naviculare.  We did discuss orthotics as well.     Quetzaly Ebner T. [place], [place]

## 2021-05-10 ENCOUNTER — Ambulatory Visit: Payer: 59 | Admitting: Podiatry

## 2021-05-26 ENCOUNTER — Encounter: Payer: Self-pay | Admitting: Family Medicine

## 2021-06-05 ENCOUNTER — Ambulatory Visit: Payer: 59 | Admitting: Podiatry

## 2021-06-14 ENCOUNTER — Ambulatory Visit: Payer: 59 | Admitting: Podiatry

## 2021-06-14 NOTE — Telephone Encounter (Signed)
Patient refill request.  She states she is taking 2 pills daily. Patient scheduled first available appt with Dr. Raoul Pitch on 10/14. She also stated she has UTI, advised patient to go to UC OR she can call office tomorrow, to see if Dr. Raoul Pitch has any available openings tomorrow.   [facility] - [place]  escitalopram (LEXAPRO) 5 MG tablet [[id number]]

## 2021-06-15 ENCOUNTER — Encounter (HOSPITAL_COMMUNITY): Payer: Self-pay

## 2021-06-15 ENCOUNTER — Ambulatory Visit (INDEPENDENT_AMBULATORY_CARE_PROVIDER_SITE_OTHER): Payer: 59

## 2021-06-15 ENCOUNTER — Ambulatory Visit (HOSPITAL_COMMUNITY)
Admission: RE | Admit: 2021-06-15 | Discharge: 2021-06-15 | Disposition: A | Discharge status: Home / Self Care | Payer: 59 | Source: Ambulatory Visit | Attending: Student | Admitting: Student

## 2021-06-15 ENCOUNTER — Other Ambulatory Visit: Payer: Self-pay

## 2021-06-15 VITALS — BP 141/87 | HR 51 | Temp 98.8°F | Resp 17

## 2021-06-15 DIAGNOSIS — M545 Low back pain, unspecified: Secondary | ICD-10-CM | POA: Diagnosis not present

## 2021-06-15 DIAGNOSIS — R8290 Unspecified abnormal findings in urine: Secondary | ICD-10-CM | POA: Diagnosis present

## 2021-06-15 DIAGNOSIS — R1030 Lower abdominal pain, unspecified: Secondary | ICD-10-CM

## 2021-06-15 DIAGNOSIS — K5900 Constipation, unspecified: Secondary | ICD-10-CM | POA: Diagnosis not present

## 2021-06-15 LAB — POCT URINALYSIS DIPSTICK, ED / UC
Bilirubin Urine: NEGATIVE
Glucose, UA: NEGATIVE mg/dL
Ketones, ur: NEGATIVE mg/dL
Leukocytes,Ua: NEGATIVE
Nitrite: NEGATIVE
Protein, ur: NEGATIVE mg/dL
Specific Gravity, Urine: 1.01 (ref 1.005–1.030)
Urobilinogen, UA: 0.2 mg/dL (ref 0.0–1.0)
pH: 5.5 (ref 5.0–8.0)

## 2021-06-15 IMAGING — DX DG ABDOMEN 1V
1 series · 1 of 1 positions shown · non-contrast
Comparison: None.

CLINICAL DATA: Lower abdominal pain, history of kidney stones

EXAM:
ABDOMEN - 1 VIEW

[abdomen kub]
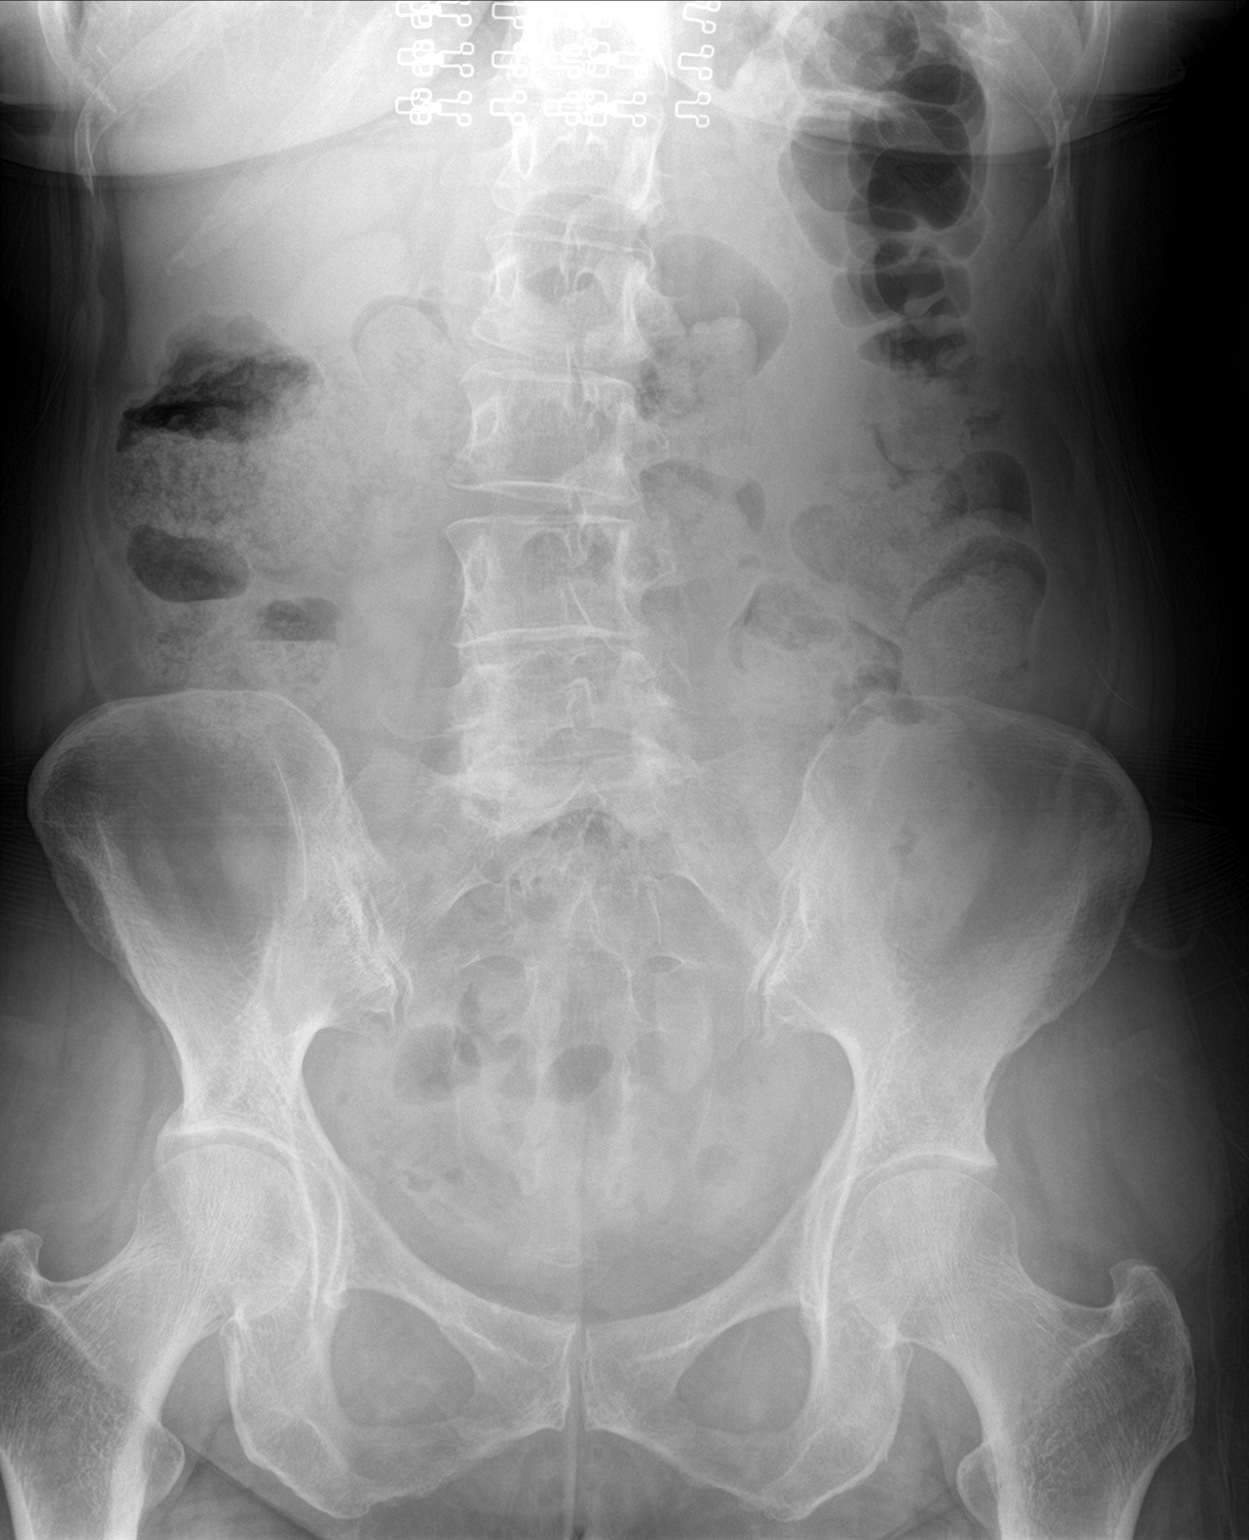

[1 of 1 positions shown; findings below may reference images not displayed]

FINDINGS: Nonobstructive pattern of bowel gas. Large burden of stool
throughout the colon and rectum, which generally limits evaluation
for urinary tract calculi. Within this limitation, no obvious
urinary tract calculi. Osseous structures unremarkable. No obvious
free air.
IMPRESSION: 1.  Nonobstructive pattern of bowel gas.

2. Large burden of stool throughout the colon and rectum, which
generally limits evaluation for urinary tract calculi.

3.  Within this limitation, no obvious urinary tract calculi.

## 2021-06-15 MED ORDER — ESCITALOPRAM OXALATE 10 MG PO TABS: 10.0000 mg | ORAL_TABLET | Freq: Every day | ORAL | 0 refills | Status: DC

## 2021-06-15 MED ORDER — CEPHALEXIN 500 MG PO CAPS: 500.0000 mg | ORAL_CAPSULE | Freq: Three times a day (TID) | ORAL | 0 refills | Status: DC

## 2021-06-15 NOTE — ED Triage Notes (Signed)
Pt reports for over week had lower abd pains that radiates to lower back. Thinks could have UTI. Reports hx kidney stones last year. Denies dysuria or blood in urine.

## 2021-06-15 NOTE — Discharge Instructions (Signed)
Your urine shows trace blood.  Your x-ray showed significant stool but no obvious kidney stone or evidence of abdominal obstruction.  We are going to treat you for a urinary tract infection with Keflex 500 mg 3 times a day.  We are sending her urine off for culture and if this returns positive we will need to change antibiotics we will contact you.  I do recommend following up with your primary care provider and/or your urologist as you may need imaging or more testing that we are not capable of in urgent care.  If you have any sudden worsening of symptoms including fever, severe pain, nausea/vomiting interfering with oral intake you need to go to the emergency room.

## 2021-06-15 NOTE — ED Provider Notes (Signed)
[place]    CSN: [id number] Arrival date & time: 06/15/21  1239      History   Chief Complaint Chief Complaint  Patient presents with   appt 1300   Abdominal Pain    HPI Mary Escobar is a 55 y.o. female.   Patient presents today with a 1 week history of lower abdominal pain that radiates to her back.  She is concerned for UTI as she has also experienced malodorous urine but denies any dysuria, frequency, urgency.  She does report some increased vaginal discharge but states is at baseline.  She denies any changes to personal hygiene products including soaps or detergents.  Denies any recent antibiotic use.  She denies any fever, hematuria, nausea, vomiting, changes in bowel habits.  She does have a history of nephrolithiasis and states symptoms are similar but not the same as previous episodes of this condition.  She was followed by urology and required ureteral stent placement but states this was last treated 1 year ago and she has not had ongoing symptoms since that time.  Pain is rated 3 on a 0-10 pain scale, localized to lower abdomen, described as aching, no aggravating relieving factors identified.   Past Medical History:  Diagnosis Date   Allergy    Anxiety    Arthritis    Chronic constipation 01/11/2020   Chronic kidney disease    kidney stones   COPD (chronic obstructive pulmonary disease) (HCC)    DOE (dyspnea on exertion) 01/11/2020   Fainting spell    Family history of premature CAD    Fatigue 2020   multifactoral   GERD (gastroesophageal reflux disease)    History of kidney stones    Hot flashes 02/11/2020   Hyperlipidemia    Hypertension    Hypothyroid    IBS (irritable bowel syndrome)    Kidney stone 08/18/2019   mild nonobstructive CAD on cath 2009    Osteoporosis    Other complicated headache syndrome 12/21/2020   Palpitation    Polyarthralgia 12/08/2018   Smoker    Snoring 09/08/2018   Vitamin D deficiency     Patient Active Problem  List   Diagnosis Date Noted   Bilateral lower extremity edema 12/21/2020   Dizziness 12/21/2020   Overweight (BMI 25.0-29.9) 12/21/2020   Hypertension    Hyperlipidemia LDL goal <70    Shortness of breath 01/11/2020   Depression, major, single episode, moderate ([place]) 01/11/2020   COPD (chronic obstructive pulmonary disease) ([place]) 12/08/2019   Coronary artery disease involving native coronary artery of native heart with angina pectoris ([place]) 12/08/2019   Irritable bowel syndrome 07/16/2019   Chronic bilateral back pain 12/08/2018   Vitamin D deficiency 12/08/2018   Family history of premature CAD 09/08/2018   Nicotine dependence 10/24/2011   Allergic rhinitis 10/29/2007   History of palpitations 04/08/2007   Hypothyroidism 07/01/2006   Anxiety 07/01/2006    Past Surgical History:  Procedure Laterality Date   CORONARY ANGIOGRAM  2009   mild CAD noted after abn GXT   CYSTOSCOPY W/ URETERAL STENT PLACEMENT Right 03/31/2020   Procedure: CYSTOSCOPY WITH STENT PLACEMENT;  Surgeon: Alexis Frock, MD;  Location: [facility];  Service: Urology;  Laterality: Right;   CYSTOSCOPY WITH RETROGRADE PYELOGRAM, URETEROSCOPY AND STENT PLACEMENT Right 04/21/2020   Procedure: CYSTOSCOPY WITH RETROGRADE PYELOGRAM, RIGHT URETEROSCOPY;  Surgeon: Alexis Frock, MD;  Location: [facility];  Service: Urology;  Laterality: Right;  1 HR   HOLMIUM LASER APPLICATION Right 7/74/1287  Procedure: HOLMIUM LASER APPLICATION;  Surgeon: Alexis Frock, MD;  Location: [facility];  Service: Urology;  Laterality: Right;   WRIST SURGERY      OB History     Gravida  1   Para  1   Term      Preterm      AB      Living  1      SAB      IAB      Ectopic      Multiple      Live Births               Home Medications    Prior to Admission medications   Medication Sig Start Date End Date Taking? Authorizing Provider  cephALEXin (KEFLEX) 500 MG capsule Take 1 capsule (500 mg total) by mouth 3 (three) times  daily. 06/15/21  Yes Kevin Space, Derry Skill, PA-C  aspirin EC 81 MG tablet Take 81 mg by mouth daily. Swallow whole.    [provider]  BIOTIN 5000 PO Take by mouth.    [provider]  buPROPion (WELLBUTRIN XL) 300 MG 24 hr tablet Take 1 tablet (300 mg total) by mouth daily. 12/21/20   Kuneff, Renee A, DO  cetirizine (ZYRTEC) 10 MG tablet Take 1 tablet (10 mg total) by mouth daily. 11/20/20   Kuneff, Renee A, DO  Cholecalciferol (D3-1000 PO) Take by mouth.    [provider]  cyanocobalamin 1000 MCG tablet Take 1,000 mcg by mouth daily.    [provider]  ELDERBERRY PO Take 1 tablet by mouth daily.    [provider]  escitalopram (LEXAPRO) 10 MG tablet Take 1 tablet (10 mg total) by mouth daily. 06/15/21   Kuneff, Renee A, DO  fluticasone (FLONASE) 50 MCG/ACT nasal spray Place 2 sprays into both nostrils in the morning and at bedtime. Patient taking differently: Place 2 sprays into both nostrils in the morning and at bedtime. Takes at bedtime now 12/21/20   Kuneff, Renee A, DO  Fluticasone-Umeclidin-Vilant (TRELEGY ELLIPTA) 100-62.5-25 MCG/INH AEPB Inhale 1 puff into the lungs daily. 12/21/20   Kuneff, Renee A, DO  levothyroxine (SYNTHROID) 75 MCG tablet Take 1 tablet (75 mcg total) by mouth daily. Prior scripts- new provider 12/21/20 12/21/21  Howard Pouch A, DO  MAGNESIUM PO Take 500 mg by mouth daily.    [provider]  methylPREDNISolone (MEDROL DOSEPAK) 4 MG TBPK tablet 6 day dose pack - take as directed 05/01/21   Hyatt, Max T, DPM  metoprolol succinate (TOPROL-XL) 25 MG 24 hr tablet Take 0.5-1 tablets (12.5-25 mg total) by mouth daily. 12/21/20   Kuneff, Renee A, DO  nitroGLYCERIN (NITROSTAT) 0.4 MG SL tablet Place 1 tablet (0.4 mg total) under the tongue every 5 (five) minutes as needed for chest pain. 12/08/19 04/17/20  Erlene Quan, PA-C  omeprazole (PRILOSEC) 10 MG capsule Take 10 mg by mouth daily.    [provider]  rosuvastatin  (CRESTOR) 40 MG tablet Take 1 tablet (40 mg total) by mouth daily. 12/21/20   Ma Hillock, DO    Family History Family History  Problem Relation Age of Onset   CAD Father        MI at age 13   Heart disease Father        CABG x 3   Sleep apnea Father    Arrhythmia Father        pacemaker   COPD Father    Diabetes  Father    Thyroid disease Father    Hyperlipidemia Father    Arthritis Father    Hypertension Father    Heart attack Father    Colon polyps Father    Diabetes Mother    Other Mother        carcinoid tumor   Hyperlipidemia Mother    Arthritis Mother    Hypertension Mother    Colon cancer Mother    Crohn's disease Sister    Colon cancer Maternal Aunt    Arthritis Maternal Grandmother    Diabetes Maternal Grandmother    Heart attack Maternal Grandmother    Heart disease Maternal Grandmother    Arthritis Maternal Grandfather    Heart attack Maternal Grandfather 63       died 70 of MI   Arthritis Paternal Grandmother    Stroke Paternal Grandmother    Arthritis Paternal Grandfather    Heart attack Paternal Grandfather 65       Died of MI   Healthy Daughter    Esophageal cancer Neg Hx    Stomach cancer Neg Hx    Rectal cancer Neg Hx     Social History Social History   Tobacco Use   Smoking status: Every Day    Packs/day: 0.50    Types: Cigarettes   Smokeless tobacco: Never  Vaping Use   Vaping Use: Never used  Substance Use Topics   Alcohol use: No    Alcohol/week: 0.0 standard drinks   Drug use: No     Allergies   Citalopram, Ciprofloxacin, Effexor [venlafaxine], and Sulfonamide derivatives   Review of Systems Review of Systems  Constitutional:  Positive for activity change. Negative for appetite change, fatigue and fever.  Respiratory:  Negative for cough and shortness of breath.   Cardiovascular:  Negative for chest pain.  Gastrointestinal:  Positive for abdominal pain. Negative for diarrhea, nausea and vomiting.  Genitourinary:   Positive for vaginal discharge. Negative for dysuria, flank pain, frequency, hematuria, pelvic pain, urgency, vaginal bleeding and vaginal pain.  Musculoskeletal:  Positive for back pain. Negative for arthralgias and myalgias.  Neurological:  Negative for dizziness, light-headedness and headaches.    Physical Exam Triage Vital Signs ED Triage Vitals  Enc Vitals Group     BP 06/15/21 1308 (!) 141/87     Pulse Rate 06/15/21 1308 (!) 51     Resp 06/15/21 1308 17     Temp 06/15/21 1308 98.8 F (37.1 C)     Temp Source 06/15/21 1308 Oral     SpO2 06/15/21 1308 96 %     Weight --      Height --      Head Circumference --      Peak Flow --      Pain Score 06/15/21 1307 3     Pain Loc --      Pain Edu? --      Excl. in [place]? --    No data found.  Updated Vital Signs BP (!) 141/87 (BP Location: Left Arm)   Pulse (!) 51   Temp 98.8 F (37.1 C) (Oral)   Resp 17   LMP  (LMP Unknown)   SpO2 96%   Visual Acuity Right Eye Distance:   Left Eye Distance:   Bilateral Distance:    Right Eye Near:   Left Eye Near:    Bilateral Near:     Physical Exam Vitals reviewed.  Constitutional:      General: She is awake. She is  not in acute distress.    Appearance: Normal appearance. She is well-developed. She is not ill-appearing.     Comments: Very pleasant female appears stated age no acute distress sitting comfortably in exam room  HENT:     Head: Normocephalic and atraumatic.  Cardiovascular:     Rate and Rhythm: Normal rate and regular rhythm.     Heart sounds: Normal heart sounds, S1 normal and S2 normal. No murmur heard. Pulmonary:     Effort: Pulmonary effort is normal.     Breath sounds: Normal breath sounds. No wheezing, rhonchi or rales.     Comments: Clear to auscultation bilaterally Abdominal:     General: Bowel sounds are normal.     Palpations: Abdomen is soft.     Tenderness: There is abdominal tenderness in the right lower quadrant, suprapubic area and left lower  quadrant. There is no right CVA tenderness, left CVA tenderness, guarding or rebound.     Comments: Mild tenderness palpation throughout lower abdomen.  No evidence of acute abdomen on physical exam.  No CVA tenderness.  Musculoskeletal:     Cervical back: No tenderness or bony tenderness.     Thoracic back: No tenderness or bony tenderness.     Lumbar back: No tenderness or bony tenderness.  Psychiatric:        Behavior: Behavior is cooperative.     UC Treatments / Results  Labs (all labs ordered are listed, but only abnormal results are displayed) Labs Reviewed  POCT URINALYSIS DIPSTICK, ED / UC - Abnormal; Notable for the following components:      Result Value   Hgb urine dipstick TRACE (*)    All other components within normal limits  URINE CULTURE  CERVICOVAGINAL ANCILLARY ONLY    EKG   Radiology DG Abdomen 1 View  Result Date: 06/15/2021 CLINICAL DATA:  Lower abdominal pain, history of kidney stones EXAM: ABDOMEN - 1 VIEW COMPARISON:  None. FINDINGS: Nonobstructive pattern of bowel gas. Large burden of stool throughout the colon and rectum, which generally limits evaluation for urinary tract calculi. Within this limitation, no obvious urinary tract calculi. Osseous structures unremarkable. No obvious free air. IMPRESSION: 1.  Nonobstructive pattern of bowel gas. 2. Large burden of stool throughout the colon and rectum, which generally limits evaluation for urinary tract calculi. 3.  Within this limitation, no obvious urinary tract calculi. Electronically Signed   By: Eddie Candle M.D.   On: 06/15/2021 14:04    Procedures Procedures (including critical care time)  Medications Ordered in UC Medications - No data to display  Initial Impression / Assessment and Plan / UC Course  I have reviewed the triage vital signs and the nursing notes.  Pertinent labs & imaging results that were available during my care of the patient were reviewed by me and considered in my medical  decision making (see chart for details).     Vital signs and physical exam reassuring today; no indication for emergent evaluation or imaging.  UA showed trace hemoglobin with no significant evidence of infection, however, clinical presentation more consistent with UTI.  Urine culture was obtained-results pending.  Will empirically treat with Keflex discussed potentially change antibiotics based on the culture results obtained.  Given history of nephrolithiasis with trace hemoglobin on UA KUB was obtained that showed moderate stool burden without radiopaque calculus.  Patient was instructed to increase fiber and drink plenty of water to help manage the symptoms.  Discussed that we are unable to obtain imaging in  urgent care setting so she has persistent symptoms or if at any point anything worsens she needs to go to the emergency room for further evaluation and management.  Recommend she follow-up with her primary care provider or urologist for further evaluation and management.  Discussed alarm symptoms that warrant emergent evaluation.  Strict return precautions given to which she expressed understanding.  Final Clinical Impressions(s) / UC Diagnoses   Final diagnoses:  Lower abdominal pain  Acute bilateral low back pain without sciatica  Malodorous urine     Discharge Instructions      Your urine shows trace blood.  Your x-ray showed significant stool but no obvious kidney stone or evidence of abdominal obstruction.  We are going to treat you for a urinary tract infection with Keflex 500 mg 3 times a day.  We are sending her urine off for culture and if this returns positive we will need to change antibiotics we will contact you.  I do recommend following up with your primary care provider and/or your urologist as you may need imaging or more testing that we are not capable of in urgent care.  If you have any sudden worsening of symptoms including fever, severe pain, nausea/vomiting interfering  with oral intake you need to go to the emergency room.     ED Prescriptions     Medication Sig Dispense Auth. Provider   cephALEXin (KEFLEX) 500 MG capsule Take 1 capsule (500 mg total) by mouth 3 (three) times daily. 21 capsule Katelynn Heidler K, PA-C      PDMP not reviewed this encounter.   Terrilee Croak, PA-C 06/15/21 1424

## 2021-06-15 NOTE — Telephone Encounter (Signed)
I don't see where med was increased.

## 2021-06-15 NOTE — Telephone Encounter (Signed)
I have refilled her Lexapro at the 10 mg dosage.  Please tell her to only take 1 tab now that the pill is 10 mg per tab. Her to keep the upcoming appointment because she is due for all of her chronic conditions, she will likely be running out of her other medications as well very shortly. Thanks.

## 2021-06-16 ENCOUNTER — Encounter: Payer: Self-pay | Admitting: Family Medicine

## 2021-06-16 LAB — URINE CULTURE: Culture: <10000 — AB

## 2021-06-18 ENCOUNTER — Telehealth (HOSPITAL_COMMUNITY): Payer: Self-pay | Admitting: Emergency Medicine

## 2021-06-18 LAB — CERVICOVAGINAL ANCILLARY ONLY
Bacterial Vaginitis (gardnerella): POSITIVE — AB
Candida Glabrata: NEGATIVE
Candida Vaginitis: NEGATIVE
Chlamydia: NEGATIVE
Comment: NEGATIVE
Comment: NEGATIVE
Comment: NEGATIVE
Comment: NEGATIVE
Comment: NEGATIVE
Comment: NORMAL
Neisseria Gonorrhea: NEGATIVE
Trichomonas: NEGATIVE

## 2021-06-18 MED ORDER — METRONIDAZOLE 500 MG PO TABS: 500.0000 mg | ORAL_TABLET | Freq: Two times a day (BID) | ORAL | 0 refills | Status: DC

## 2021-06-19 ENCOUNTER — Other Ambulatory Visit: Payer: Self-pay

## 2021-06-19 ENCOUNTER — Ambulatory Visit (INDEPENDENT_AMBULATORY_CARE_PROVIDER_SITE_OTHER): Payer: 59 | Admitting: Podiatry

## 2021-06-19 DIAGNOSIS — M2142 Flat foot [pes planus] (acquired), left foot: Secondary | ICD-10-CM | POA: Diagnosis not present

## 2021-06-19 DIAGNOSIS — Q742 Other congenital malformations of lower limb(s), including pelvic girdle: Secondary | ICD-10-CM | POA: Diagnosis not present

## 2021-06-19 DIAGNOSIS — M2141 Flat foot [pes planus] (acquired), right foot: Secondary | ICD-10-CM | POA: Diagnosis not present

## 2021-06-19 NOTE — Progress Notes (Signed)
She presents today states that she is still having pain and she is referring to her left foot.  Objective: I reviewed her past medical history medications allergies surgery social history MRIs.  Currently pes planovalgus with a very prominent os naviculare left is noted.  Assessment: Painful os naviculare and tibialis of posterior.  Plan: Discussed etiology pathology and surgical therapies at this point time went ahead and consented her for a [facility] procedure today with excision of os naviculare and a cast application.  She understands this and is amendable to it.  We did discuss possible postop complications which may include but not limited to postop pain bleeding swelling failure recurrence need for further surgery overcorrection under correction loss of digit loss of limb loss of life.  I would like to follow-up with her in the near future for surgical intervention.

## 2021-06-29 ENCOUNTER — Ambulatory Visit (INDEPENDENT_AMBULATORY_CARE_PROVIDER_SITE_OTHER): Payer: 59

## 2021-06-29 ENCOUNTER — Encounter: Payer: Self-pay | Admitting: Internal Medicine

## 2021-06-29 ENCOUNTER — Ambulatory Visit (INDEPENDENT_AMBULATORY_CARE_PROVIDER_SITE_OTHER): Payer: 59 | Admitting: Internal Medicine

## 2021-06-29 ENCOUNTER — Other Ambulatory Visit: Payer: Self-pay

## 2021-06-29 ENCOUNTER — Telehealth: Payer: Self-pay | Admitting: Urology

## 2021-06-29 DIAGNOSIS — R0609 Other forms of dyspnea: Secondary | ICD-10-CM

## 2021-06-29 DIAGNOSIS — F17210 Nicotine dependence, cigarettes, uncomplicated: Secondary | ICD-10-CM

## 2021-06-29 IMAGING — DX DG CHEST 2V
2 series · 2 of 2 positions shown · non-contrast
Comparison: Chest x-ray [DATE].

CLINICAL DATA: 55-year-old female with history of dyspnea on
exertion.

EXAM:
CHEST - 2 VIEW

[chest pa]
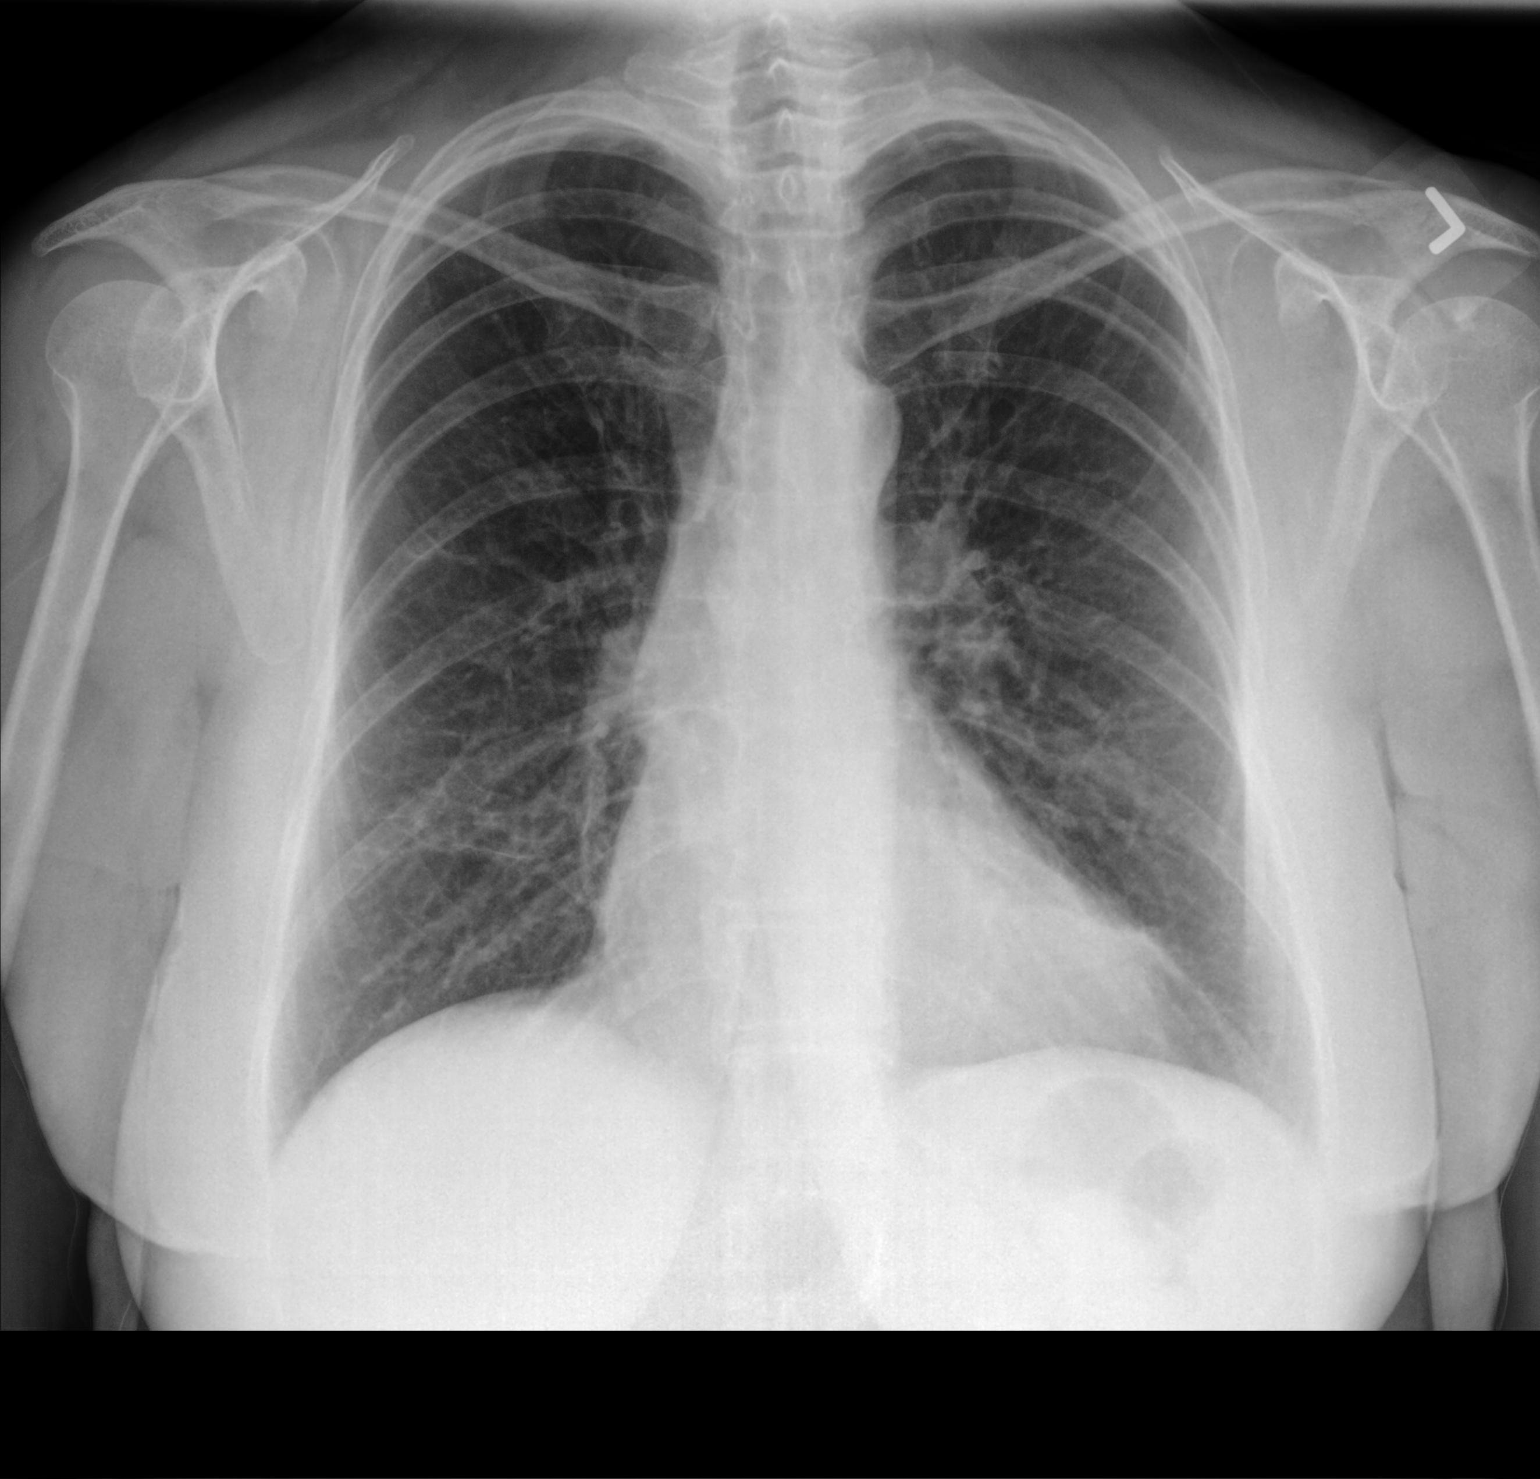

[chest lat]
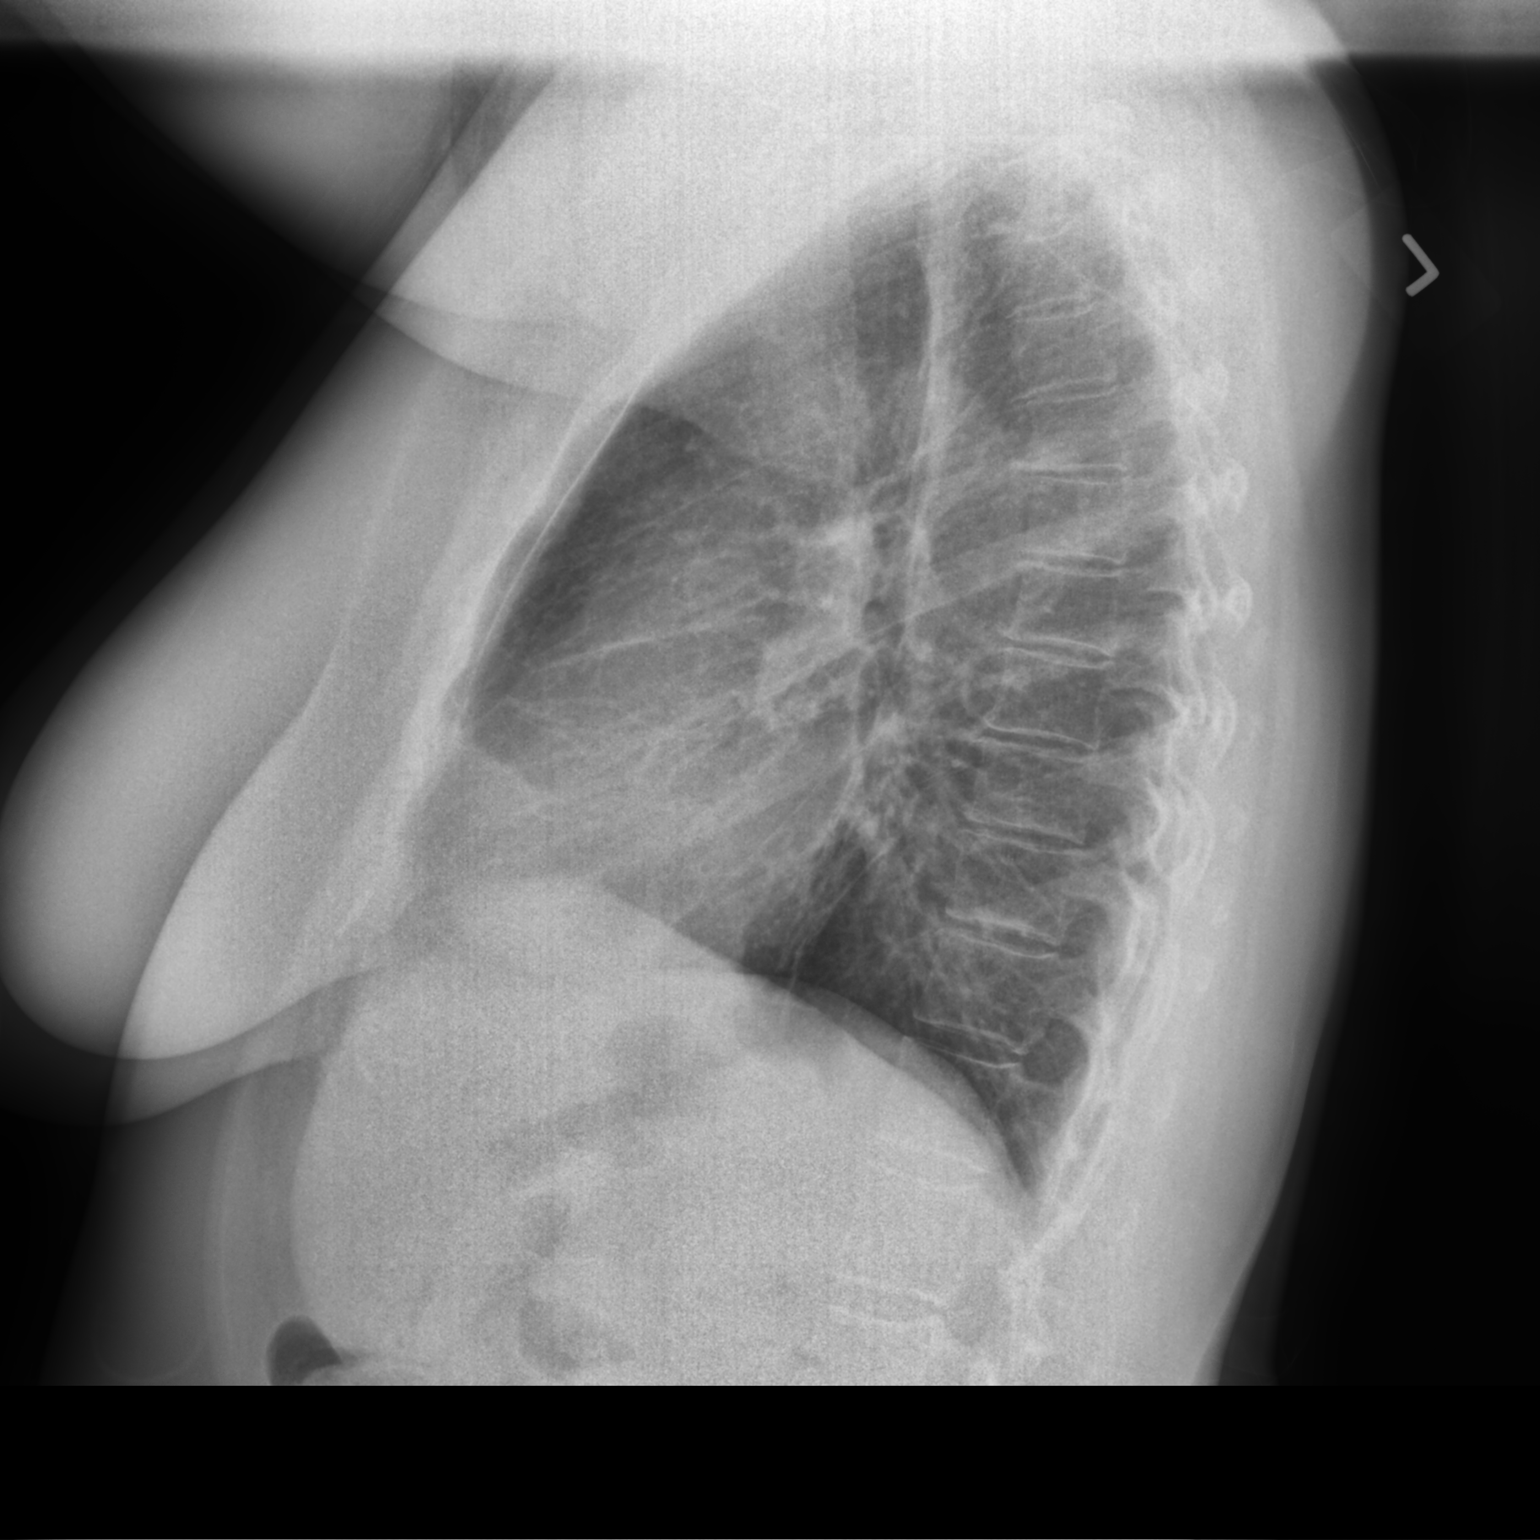

[2 of 2 positions shown; findings below may reference images not displayed]

FINDINGS: Scattered areas of interstitial prominence and widespread
peribronchial cuffing are noted. Lung volumes are normal. No
consolidative airspace disease. No pleural effusions. No
pneumothorax. No pulmonary nodule or mass noted. Pulmonary
vasculature and the cardiomediastinal silhouette are within normal
limits.
IMPRESSION: 1. Areas of interstitial prominence and peribronchial cuffing, which
are suggestive of an acute bronchitis.

## 2021-06-29 MED ORDER — FAMOTIDINE 20 MG PO TABS: ORAL_TABLET | ORAL | 11 refills | Status: DC

## 2021-06-29 MED ORDER — PANTOPRAZOLE SODIUM 40 MG PO TBEC: 40.0000 mg | DELAYED_RELEASE_TABLET | Freq: Every day | ORAL | 2 refills | Status: DC

## 2021-06-29 NOTE — Patient Instructions (Signed)
Plan A = Automatic = Always=    Pantoprazole (protonix) 40 mg   Take  30-60 min before first meal of the day and Pepcid (famotidine)  20 mg after supper until return to office - this is the best way to tell whether stomach acid is contributing to your problem.  And no breathe clean air   Plan B = Backup (to supplement plan A, not to replace it) Only use your albuterol inhaler as a rescue medication to be used if you can't catch your breath by resting or doing a relaxed purse lip breathing pattern.  - The less you use it, the better it will work when you need it. - Ok to use the inhaler up to 2 puffs  every 4 hours if you must but call for appointment if use goes up over your usual need - Don't leave home without it !!  (think of it like starter fluid  for your car)   Ok to try albuterol 15 min before an activity (on alternating days)  that you know would usually make you short of breath and see if it makes any difference and if makes none then don't take albuterol after activity unless you can't catch your breath as this means it's the resting that helps, not the albuterol.  Please remember to go to the lab and x-ray department  for your tests - we will call you with the results when they are available.        Please schedule a follow up office visit in 6 weeks, call sooner if needed

## 2021-06-29 NOTE — Telephone Encounter (Signed)
DOS - 07/20/21  [facility] ADVANCED TENDON LEFT --- [id number] EXCISION OS NAVICULAR LEFT --- [id number]   [facility] EFFECTIVE DATE - 09/24/19  RECEIVED FAX FROM [facility] STATING THAT FOR CPT CODE 43700 X'S 2 NO PRIOR AUTH IS REQUIRED.

## 2021-06-29 NOTE — Assessment & Plan Note (Addendum)
Onset was 2017  - spirometry 12/2019  Non diagnostic (effort dep portion ok) 06/29/2021  After extensive coaching inhaler device,  effectiveness = 90%   Labs ordered 06/29/2021  :  allergy profile   alpha one AT phenotype   - 06/29/2021   Walked on RA  x  3  lap(s) =  approx 750 @ mod fast pace, stopped due to end of study s sob  with lowest 02 sats 96%  Very little to support significant copd here at this point though could be experiencing noct chest tightness from AB or gerd or combination of 2 so rec    I reviewed the Fletcher curve with the patient that basically indicates  if you quit smoking when your best day FEV1 is still well preserved (as is appears to be   the case here)  it is highly unlikely you will progress to severe disease and informed the patient there was  no medication on the market that has proven to alter the curve/ its downward trajectory  or the likelihood of progression of their disease(unlike other chronic medical conditions such as atheroclerosis where we do think we can change the natural hx with risk reducing meds)    Therefore stopping smoking and maintaining abstinence are  the most important aspects of her care, not choice of inhalers or for that matter, doctors.   Treatment other than smoking cessation  is entirely directed by severity of symptoms and focused also on reducing exacerbations, not attempting to change the natural history of the disease. Since not having typical aecopd flares or much in terms of limiting doe then prn saba is fine pending f/u with full pfts     >>>  rx prn saba and max rx for gerd and f/u in 6 weeks with pfts

## 2021-06-29 NOTE — Assessment & Plan Note (Addendum)
Counseled re importance of smoking cessation but did not meet time criteria for separate billing    Low-dose CT lung cancer screening is recommended for patients who are 30-56 years of age with a 30+ pack-year history of smoking, and who are currently smoking or quit <=15 years ago so she is eligible > defer to PCP or we can coordinate thru this office       Each maintenance medication was reviewed in detail including emphasizing most importantly the difference between maintenance and prns and under what circumstances the prns are to be triggered using an action plan format where appropriate.  Total time for H and P, chart review, counseling, reviewing hfa device(s) , directly observing portions of ambulatory 02 saturation study/ and generating customized AVS unique to this office visit / same day charting  > 45 min

## 2021-06-29 NOTE — Progress Notes (Addendum)
Mary Escobar, female    DOB: 1966/07/12,   MRN: [id number]   Brief patient profile:  80 yowf active smoker referred to pulmonary clinic 06/29/2021 by [facility]  for sob and intermittent cough  eval by [facility] with nl spirometry in 01/11/20      History of Present Illness  06/29/2021  Pulmonary/ 1st office eval/[facility]  Chief Complaint  Patient presents with   Pulmonary Consult    Referred by Howard Pouch, DO. Pt c/o SOB for the past several years. She gets winded with exertion and when she lies down. Has tried [place] and Trelegy with no help.    Dyspnea:  no aerobics but very active at work, up and down steps most of the time co Cough: variable  jelly  like brown assoc with nasal congestion  Sleep: immediately feels tight on lying down  SABA use: rarely  On ppi 30 - 60 min before lunch x 16 IUP   No obvious day to day or daytime variability or assoc excess/ purulent sputum or mucus plugs or hemoptysis or cp or chest tightness, subjective wheeze or overt sinus or hb symptoms.   sleep without nocturnal  or early am exacerbation  of respiratory  c/o's or need for noct saba. Also denies any obvious fluctuation of symptoms with weather or environmental changes or other aggravating or alleviating factors except as outlined above   No unusual exposure hx or h/o childhood pna/ asthma or knowledge of premature birth.  Current Allergies, Complete Past Medical History, Past Surgical History, Family History, and Social History were reviewed in [employer] record.  ROS  The following are not active complaints unless bolded Hoarseness, sore throat, dysphagia, dental problems, itching, sneezing,  nasal congestion or discharge of excess mucus or purulent secretions, ear ache,   fever, chills, sweats, unintended wt loss or wt gain, classically pleuritic or exertional cp,  orthopnea pnd or arm/hand swelling  or leg swelling, presyncope, palpitations, abdominal pain, anorexia, nausea,  vomiting, diarrhea  or change in bowel habits or change in bladder habits, change in stools or change in urine, dysuria, hematuria,  rash, arthralgias, visual complaints, headache, numbness, weakness or ataxia or problems with walking or coordination,  change in mood or  memory.           Past Medical History:  Diagnosis Date   Allergy    Anxiety    Arthritis    Chronic constipation 01/11/2020   Chronic kidney disease    kidney stones   COPD (chronic obstructive pulmonary disease) (HCC)    DOE (dyspnea on exertion) 01/11/2020   Fainting spell    Family history of premature CAD    Fatigue 2020   multifactoral   GERD (gastroesophageal reflux disease)    History of kidney stones    Hot flashes 02/11/2020   Hyperlipidemia    Hypertension    Hypothyroid    IBS (irritable bowel syndrome)    Kidney stone 08/18/2019   mild nonobstructive CAD on cath 2009    Osteoporosis    Other complicated headache syndrome 12/21/2020   Palpitation    Polyarthralgia 12/08/2018   Smoker    Snoring 09/08/2018   Vitamin D deficiency     Outpatient Medications Prior to Visit  Medication Sig Dispense Refill   albuterol (VENTOLIN HFA) 108 (90 Base) MCG/ACT inhaler Inhale 2 puffs into the lungs every 6 (six) hours as needed for wheezing or shortness of breath.     aspirin EC 81 MG  tablet Take 81 mg by mouth daily. Swallow whole.     buPROPion (WELLBUTRIN XL) 300 MG 24 hr tablet Take 1 tablet (300 mg total) by mouth daily. 90 tablet 1   cetirizine (ZYRTEC) 10 MG tablet Take 1 tablet (10 mg total) by mouth daily. 90 tablet 4   Cholecalciferol (D3-1000 PO) Take by mouth.     cyanocobalamin 1000 MCG tablet Take 1,000 mcg by mouth daily.     escitalopram (LEXAPRO) 10 MG tablet Take 1 tablet (10 mg total) by mouth daily. 90 tablet 0   FIBER ADULT GUMMIES PO Take 2 each by mouth daily.     fluticasone (FLONASE) 50 MCG/ACT nasal spray Place 2 sprays into both nostrils in the morning and at bedtime. (Patient taking  differently: Place 2 sprays into both nostrils in the morning and at bedtime. Takes at bedtime now) 16 g 6   levothyroxine (SYNTHROID) 75 MCG tablet Take 1 tablet (75 mcg total) by mouth daily. Prior scripts- new provider 90 tablet 3   MAGNESIUM PO Take 500 mg by mouth daily.     metoprolol succinate (TOPROL-XL) 25 MG 24 hr tablet Take 0.5-1 tablets (12.5-25 mg total) by mouth daily. 90 tablet 1   nitroGLYCERIN (NITROSTAT) 0.4 MG SL tablet Place 1 tablet (0.4 mg total) under the tongue every 5 (five) minutes as needed for chest pain. 25 tablet 4   omeprazole (PRILOSEC) 10 MG capsule Take 10 mg by mouth daily.     rosuvastatin (CRESTOR) 40 MG tablet Take 1 tablet (40 mg total) by mouth daily. 90 tablet 3   BIOTIN 5000 PO Take by mouth.         0   ELDERBERRY PO Take 1 tablet by mouth daily.         5              0       Objective:     BP 132/78 (BP Location: Left Arm, Cuff Size: Normal)   Pulse (!) 55   Temp 98.6 F (37 C) (Oral)   Ht 5\' 7"  (1.702 m)   Wt 189 lb (85.7 kg)   LMP  (LMP Unknown)   SpO2 97% Comment: on RA  BMI 29.60 kg/m   SpO2: 97 % (on RA)  HEENT : pt wearing mask not removed for exam due to covid - 19 concerns.   NECK :  without JVD/Nodes/TM/ nl carotid upstrokes bilaterally   LUNGS: no acc muscle use,  Min barrel  contour chest wall with bilateral  slightly decreased bs s audible wheeze and  without cough on insp or exp maneuvers and min  Hyperresonant  to  percussion bilaterally     CV:  RRR  no s3 or murmur or increase in P2, and no edema   ABD:  soft and nontender with pos end  insp Hoover's  in the supine position. No bruits or organomegaly appreciated, bowel sounds nl  MS:   Nl gait/  ext warm without deformities, calf tenderness, cyanosis or clubbing No obvious joint restrictions   SKIN: warm and dry without lesions    NEURO:  alert, approp, nl sensorium with  no motor or cerebellar deficits apparent.       CXR PA and Lateral:   06/29/2021 :     I personally reviewed images and  impression as follows:  Min copd       Assessment   No problem-specific Assessment & Plan notes found for this encounter.  Christinia Gully, MD 06/29/2021

## 2021-07-02 LAB — CBC WITH DIFFERENTIAL/PLATELET
Absolute Monocytes: 562 cells/uL (ref 200–950)
Basophils Absolute: 51 cells/uL (ref 0–200)
Basophils Relative: 0.7 %
Eosinophils Absolute: 139 cells/uL (ref 15–500)
Eosinophils Relative: 1.9 %
HCT: 44.1 % (ref 35.0–45.0)
Hemoglobin: 14.8 g/dL (ref 11.7–15.5)
Lymphs Abs: 2876 cells/uL (ref 850–3900)
MCH: 32.5 pg (ref 27.0–33.0)
MCHC: 33.6 g/dL (ref 32.0–36.0)
MCV: 96.9 fL (ref 80.0–100.0)
MPV: 10.5 fL (ref 7.5–12.5)
Monocytes Relative: 7.7 %
Neutro Abs: 3672 cells/uL (ref 1500–7800)
Neutrophils Relative %: 50.3 %
Platelets: 252 10*3/uL (ref 140–400)
RBC: 4.55 10*6/uL (ref 3.80–5.10)
RDW: 13.1 % (ref 11.0–15.0)
Total Lymphocyte: 39.4 %
WBC: 7.3 10*3/uL (ref 3.8–10.8)

## 2021-07-02 LAB — IGE: IgE (Immunoglobulin E), Serum: 215 kU/L — ABNORMAL HIGH (ref <=114)

## 2021-07-03 LAB — ALPHA-1-ANTITRYPSIN PHENOTYP: A-1 Antitrypsin: 140 mg/dL (ref 101–187)

## 2021-07-03 NOTE — Telephone Encounter (Signed)
MW please advise. Thanks  What does all these test results mean from my blood work? Getting results back through my chart.

## 2021-07-06 ENCOUNTER — Other Ambulatory Visit: Payer: Self-pay

## 2021-07-06 ENCOUNTER — Encounter: Payer: Self-pay | Admitting: Family Medicine

## 2021-07-06 ENCOUNTER — Ambulatory Visit (INDEPENDENT_AMBULATORY_CARE_PROVIDER_SITE_OTHER): Payer: 59 | Admitting: Family Medicine

## 2021-07-06 VITALS — BP 116/74 | HR 50 | Temp 98.3°F | Ht 67.0 in | Wt 185.0 lb

## 2021-07-06 DIAGNOSIS — Z87898 Personal history of other specified conditions: Secondary | ICD-10-CM | POA: Diagnosis not present

## 2021-07-06 DIAGNOSIS — Z8249 Family history of ischemic heart disease and other diseases of the circulatory system: Secondary | ICD-10-CM

## 2021-07-06 DIAGNOSIS — F17210 Nicotine dependence, cigarettes, uncomplicated: Secondary | ICD-10-CM

## 2021-07-06 DIAGNOSIS — R0609 Other forms of dyspnea: Secondary | ICD-10-CM

## 2021-07-06 DIAGNOSIS — F419 Anxiety disorder, unspecified: Secondary | ICD-10-CM | POA: Diagnosis not present

## 2021-07-06 DIAGNOSIS — E034 Atrophy of thyroid (acquired): Secondary | ICD-10-CM

## 2021-07-06 DIAGNOSIS — E785 Hyperlipidemia, unspecified: Secondary | ICD-10-CM

## 2021-07-06 DIAGNOSIS — I10 Essential (primary) hypertension: Secondary | ICD-10-CM

## 2021-07-06 DIAGNOSIS — R600 Localized edema: Secondary | ICD-10-CM

## 2021-07-06 DIAGNOSIS — F321 Major depressive disorder, single episode, moderate: Secondary | ICD-10-CM

## 2021-07-06 DIAGNOSIS — R072 Precordial pain: Secondary | ICD-10-CM

## 2021-07-06 DIAGNOSIS — I25119 Atherosclerotic heart disease of native coronary artery with unspecified angina pectoris: Secondary | ICD-10-CM

## 2021-07-06 DIAGNOSIS — J439 Emphysema, unspecified: Secondary | ICD-10-CM

## 2021-07-06 DIAGNOSIS — E663 Overweight: Secondary | ICD-10-CM

## 2021-07-06 DIAGNOSIS — Z23 Encounter for immunization: Secondary | ICD-10-CM | POA: Diagnosis not present

## 2021-07-06 MED ORDER — FLUTICASONE PROPIONATE 50 MCG/ACT NA SUSP: 2.0000 | Freq: Two times a day (BID) | NASAL | 6 refills | Status: DC

## 2021-07-06 MED ORDER — METOPROLOL SUCCINATE ER 25 MG PO TB24: 12.5000 mg | ORAL_TABLET | Freq: Every day | ORAL | 1 refills | Status: DC

## 2021-07-06 MED ORDER — CETIRIZINE HCL 10 MG PO TABS: 10.0000 mg | ORAL_TABLET | Freq: Every day | ORAL | 4 refills | Status: DC

## 2021-07-06 MED ORDER — ROSUVASTATIN CALCIUM 40 MG PO TABS: 40.0000 mg | ORAL_TABLET | Freq: Every day | ORAL | 3 refills | Status: DC

## 2021-07-06 MED ORDER — BUPROPION HCL ER (XL) 300 MG PO TB24: 300.0000 mg | ORAL_TABLET | Freq: Every day | ORAL | 1 refills | Status: DC

## 2021-07-06 MED ORDER — ESCITALOPRAM OXALATE 20 MG PO TABS: 20.0000 mg | ORAL_TABLET | Freq: Every day | ORAL | 1 refills | Status: DC

## 2021-07-06 NOTE — Progress Notes (Signed)
This visit occurred during the SARS-CoV-2 public health emergency.  Safety protocols were in place, including screening questions prior to the visit, additional usage of staff PPE, and extensive cleaning of exam room while observing appropriate contact time as indicated for disinfecting solutions.    Mary Escobar , June 11, 1966, 55 y.o., female MRN: [id number] Patient Care Team    Relationship Specialty Notifications Start End  Ma Hillock, DO PCP - General Family Medicine  06/09/20   Lelon Perla, MD PCP - Cardiology Cardiology Admissions 12/08/19   Alexis Frock, MD Consulting Physician Urology  06/12/20   [facility]    06/12/20   Kerry Dory, NP Nurse Practitioner Nurse Practitioner  06/12/20    Comment: [facility]    Chief Complaint  Patient presents with   Hypertension    [place]; pt is not fasting     Subjective: Mary Escobar is a 55 y.o. female  Pt presents for an OV follow up on multiple worsening chronic and acute conditions.  Bilateral lower extremity swelling: Patient reports the lower extremity edema is improved. She is no longer taking Lasix or the potassium.   -echocardiogram and carotid Doppler studies  are reassuring. Prior note: Patient states that her legs have been swollen every day for the last month.  She reports it can be improved in the morning but it is still present in the morning.  She is on amlodipine 5 mg daily.  She does endorse dyspnea, and has a history of COPD.  She is established with cardiology for her history of CAD.  She has gained 14 pounds in 2 and half months.  Hypertension/HLD-LDL goal less than 70/palpitations/CAD/family history of premature CAD She has a significant medical history of nonobstructive CAD, coronary CTA 01/29/20 showed mild (25-49%) calcified plaque in the LAD, she was sent for [facility] which showed no hemodynamically significant stenosis.  Her calcium score was 156 placing her in the 96  percentile.  She has had a prior history of cardiac catheterization in 2009 after a abnormal stress treadmill test which revealed nonobstructive CAD-20% ostial LAD stenosis and 30% left circumferential disease. Pt reports compliance with metoprolol 12.5-25 mg daily.  Patient denies chest pain, shortness of breath, dizziness or lower extremity edema.  Pt is compliant with daily baby ASA. Pt is  prescribed statin. RF: Hypertension, CAD, family history of premature CAD, smoker, hyperlipidemia   Anxiety/hot flashes Patient reports compliance  with Wellbutrin 300 mg daily and lexpro 20 mg qd. Sx are well controlled.  She had been temporarily tried on Effexor but unfortunately had side effects to medication and return back to using Wellbutrin.  She states symptoms are adequately controlled.  Pulmonary emphysema, unspecified emphysema type (HCC)/nicotine dependence Patient reports she still is having feelings of shortness of breath, but feels it may have improved. She is now established with pulm. That started her on gerd therapy and did not feel she had copd by exam. Trelegy stopped and she has been ok. She states she has been on that inhaler for years from prior pcp. .  Cardiac causes have been ruled out.   Prior note: She reports history of COPD and use of Trelegy Ellipta.  She states her COPD been controlled on this medication but she has noticed an increase in her shortness of breath. She is prescribed albuterol and rarely uses.  She is still smoking at this time however she has cut back and is using Wellbutrin and  her smoking cessation.   Echo 01/26/2021: IMPRESSIONS   1. Left ventricular ejection fraction, by estimation, is 60 to 65%. The  left ventricle has normal function. The left ventricle has no regional  wall motion abnormalities. Left ventricular diastolic parameters were  normal. The average left ventricular  global longitudinal strain is -24.9 %. The global longitudinal strain is  normal.    2. Right ventricular systolic function is normal. The right ventricular  size is normal. There is normal pulmonary artery systolic pressure. The  estimated right ventricular systolic pressure is 14.4 mmHg.   3. The mitral valve is grossly normal. Trivial mitral valve  regurgitation.   4. The aortic valve is tricuspid. Aortic valve regurgitation is not  visualized.   5. The inferior vena cava is normal in size with greater than 50%  respiratory variability, suggesting right atrial pressure of 3 mmHg.   Neck soft tissue US 01/04/2021: IMPRESSION: Right submandibular palpable abnormality correlates with benign appearing cervical lymph node.   US Carotid 01/04/2021: IMPRESSION: Minor carotid atherosclerosis. No hemodynamically significant ICA stenosis. Degree of narrowing less than 50% bilaterally by ultrasound criteria.   Patent antegrade vertebral flow bilaterally Depression screen [facility] 2/9 07/06/2021 02/08/2021 11/20/2020 06/23/2020 06/09/2020  Decreased Interest 0 0 0 0 0  Down, Depressed, Hopeless 0 0 0 2 0  PHQ - 2 Score 0 0 0 2 0  Altered sleeping 1 - 0 3 0  Tired, decreased energy 3 - 0 3 0  Change in appetite 1 - 0 1 0  Feeling bad or failure about yourself  0 - 0 0 0  Trouble concentrating 2 - 0 0 0  Moving slowly or fidgety/restless 0 - 0 0 0  Suicidal thoughts 0 - 0 0 0  PHQ-9 Score 7 - 0 9 0  Difficult doing work/chores - - - - Not difficult at all   GAD 7 : Generalized Anxiety Score 07/06/2021 06/23/2020  Nervous, Anxious, on Edge 3 2  Control/stop worrying 2 2  Worry too much - different things 2 2  Trouble relaxing 2 2  Restless 0 0  Easily annoyed or irritable 2 3  Afraid - awful might happen 0 0  Total GAD 7 Score 11 11    Allergies  Allergen Reactions   Citalopram Palpitations   Ciprofloxacin Nausea And Vomiting   Effexor [Venlafaxine]     nausea   Social History   Social History Narrative   Marital status/children/pets: Single, 1 child.     education/employment: 12th grade education, employed as a Biomedical engineer.   Safety:      -smoke alarm in the home:Yes     - wears seatbelt: Yes     - Feels safe in their relationships: Yes   Past Medical History:  Diagnosis Date   Allergy    Anxiety    Arthritis    Chronic constipation 01/11/2020   Chronic kidney disease    kidney stones   COPD (chronic obstructive pulmonary disease) (HCC)    DOE (dyspnea on exertion) 01/11/2020   Fainting spell    Family history of premature CAD    Fatigue 2020   multifactoral   GERD (gastroesophageal reflux disease)    History of kidney stones    Hot flashes 02/11/2020   Hyperlipidemia    Hypertension    Hypothyroid    IBS (irritable bowel syndrome)    Kidney stone 08/18/2019   mild nonobstructive CAD on cath 2009    Osteoporosis  Other complicated headache syndrome 12/21/2020   Palpitation    Polyarthralgia 12/08/2018   Smoker    Snoring 09/08/2018   Vitamin D deficiency    Past Surgical History:  Procedure Laterality Date   CORONARY ANGIOGRAM  2009   mild CAD noted after abn GXT   CYSTOSCOPY W/ URETERAL STENT PLACEMENT Right 03/31/2020   Procedure: CYSTOSCOPY WITH STENT PLACEMENT;  Surgeon: Alexis Frock, MD;  Location: [facility];  Service: Urology;  Laterality: Right;   CYSTOSCOPY WITH RETROGRADE PYELOGRAM, URETEROSCOPY AND STENT PLACEMENT Right 04/21/2020   Procedure: CYSTOSCOPY WITH RETROGRADE PYELOGRAM, RIGHT URETEROSCOPY;  Surgeon: Alexis Frock, MD;  Location: [facility];  Service: Urology;  Laterality: Right;  1 HR   HOLMIUM LASER APPLICATION Right 1/51/7616   Procedure: HOLMIUM LASER APPLICATION;  Surgeon: Alexis Frock, MD;  Location: [facility];  Service: Urology;  Laterality: Right;   WRIST SURGERY     Family History  Problem Relation Age of Onset   Diabetes Mother    Other Mother        carcinoid tumor   Hyperlipidemia Mother    Arthritis Mother    Hypertension Mother    Colon cancer Mother    CAD Father        MI at  age 39   Heart disease Father        CABG x 3   Sleep apnea Father    Arrhythmia Father        pacemaker   COPD Father    Diabetes Father    Thyroid disease Father    Hyperlipidemia Father    Arthritis Father    Hypertension Father    Heart attack Father    Colon polyps Father    Crohn's disease Sister    Arthritis Maternal Grandmother    Diabetes Maternal Grandmother    Heart attack Maternal Grandmother    Heart disease Maternal Grandmother    Arthritis Maternal Grandfather    Heart attack Maternal Grandfather 59       died 64 of MI   Arthritis Paternal Grandmother    Stroke Paternal Grandmother    Arthritis Paternal Grandfather    Heart attack Paternal Grandfather 1       Died of MI   Pulmonary fibrosis Paternal Grandfather    Healthy Daughter    Colon cancer Maternal Aunt    Pulmonary fibrosis Paternal Uncle    Pulmonary fibrosis Paternal Aunt    Esophageal cancer Neg Hx    Stomach cancer Neg Hx    Rectal cancer Neg Hx    Allergies as of 07/06/2021       Reactions   Citalopram Palpitations   Ciprofloxacin Nausea And Vomiting   Effexor [venlafaxine]    nausea        Medication List        Accurate as of July 06, 2021 11:59 PM. If you have any questions, ask your nurse or doctor.          albuterol 108 (90 Base) MCG/ACT inhaler Commonly known as: VENTOLIN HFA Inhale 2 puffs into the lungs every 6 (six) hours as needed for wheezing or shortness of breath.   aspirin EC 81 MG tablet Take 81 mg by mouth daily. Swallow whole.   buPROPion 300 MG 24 hr tablet Commonly known as: WELLBUTRIN XL Take 1 tablet (300 mg total) by mouth daily.   cetirizine 10 MG tablet Commonly known as: ZYRTEC Take 1 tablet (10 mg total) by mouth daily.   cyanocobalamin  1000 MCG tablet Take 1,000 mcg by mouth daily.   D3-1000 PO Take by mouth.   escitalopram 20 MG tablet Commonly known as: LEXAPRO Take 1 tablet (20 mg total) by mouth daily. What changed:   medication strength how much to take Changed by: Howard Pouch, DO   famotidine 20 MG tablet Commonly known as: Pepcid One after supper   FIBER ADULT GUMMIES PO Take 2 each by mouth daily.   fluticasone 50 MCG/ACT nasal spray Commonly known as: FLONASE Place 2 sprays into both nostrils in the morning and at bedtime. What changed: additional instructions   levothyroxine 75 MCG tablet Commonly known as: Synthroid Take 1 tablet (75 mcg total) by mouth daily. Prior scripts- new provider   MAGNESIUM PO Take 500 mg by mouth daily.   metoprolol succinate 25 MG 24 hr tablet Commonly known as: TOPROL-XL Take 0.5-1 tablets (12.5-25 mg total) by mouth daily.   nitroGLYCERIN 0.4 MG SL tablet Commonly known as: NITROSTAT Place 1 tablet (0.4 mg total) under the tongue every 5 (five) minutes as needed for chest pain.   pantoprazole 40 MG tablet Commonly known as: Protonix Take 1 tablet (40 mg total) by mouth daily. Take 30-60 min before first meal of the day   rosuvastatin 40 MG tablet Commonly known as: CRESTOR Take 1 tablet (40 mg total) by mouth daily.        All past medical history, surgical history, allergies, family history, immunizations andmedications were updated in the EMR today and reviewed under the history and medication portions of their EMR.     ROS: Negative, with the exception of above mentioned in HPI   Objective:  BP 116/74   Pulse (!) 50   Temp 98.3 F (36.8 C) (Oral)   Ht 5\' 7"  (1.702 m)   Wt 185 lb (83.9 kg)   LMP  (LMP Unknown)   SpO2 97%   BMI 28.98 kg/m  Body mass index is 28.98 kg/m. Gen: Afebrile. No acute distress.nontoxic pleasant female.   HENT: AT. [place].  Eyes:Pupils Equal Round Reactive to light, Extraocular movements intact,  Conjunctiva without redness, discharge or icterus. Neck/lymp/endocrine: Supple,no lymphadenopathy, no thyromegaly CV: RRR no murmur, no edema Chest: CTAB, no wheeze or crackles Neuro:  Normal gait. PERLA. EOMi.  Alert. Oriented x3 Psych: Normal affect, dress and demeanor. Normal speech. Normal thought content and judgment.   No results found. No results found.  Assessment/Plan: JOELI FENNER is a 55 y.o. female present for OV for  Hypertension/HLD-LDL goal less than 70/palpitations/CAD/family history of premature CAD stable Continue  metoprolol 1/2-1 tab daily.  Nitro as needed prescribed by cardiology Continue  Crestor 40 mg daily prescribed by cardiology Continue daily baby aspirin Continue follow-ups with cardiology Anxiety/hot flashes/smoking cessation stable Continue Wellbutrin 300 mg daily. Continue lexapro 20 mg qd Headache-dizzines/COPD Improving- continue to follow with pulm Continue zyrtec qhs Continue nasal saline daily Consider sleep study> referred> she has not set this up yet,.  -Carotid duplex studies > WNL - she is compliant with baby aspirin daily and statin - NOT felt to be COPD dx by PULM (misdx from prior provider-?- trelegy stopped) - encouraged weight loss and smoking cessation.   Influenza vaccine administered today    f/u 5.5 mos.   Reviewed expectations re: course of current medical issues. Discussed self-management of symptoms. Outlined signs and symptoms indicating need for more acute intervention. Patient verbalized understanding and all questions were answered. Patient received an After-Visit Summary.    Orders Placed  This Encounter  Procedures   Flu Vaccine QUAD 6+ mos PF IM (Fluarix Quad PF)   Meds ordered this encounter  Medications   buPROPion (WELLBUTRIN XL) 300 MG 24 hr tablet    Sig: Take 1 tablet (300 mg total) by mouth daily.    Dispense:  90 tablet    Refill:  1   escitalopram (LEXAPRO) 20 MG tablet    Sig: Take 1 tablet (20 mg total) by mouth daily.    Dispense:  90 tablet    Refill:  1   fluticasone (FLONASE) 50 MCG/ACT nasal spray    Sig: Place 2 sprays into both nostrils in the morning and at bedtime.    Dispense:  16 g     Refill:  6   metoprolol succinate (TOPROL-XL) 25 MG 24 hr tablet    Sig: Take 0.5-1 tablets (12.5-25 mg total) by mouth daily.    Dispense:  90 tablet    Refill:  1   cetirizine (ZYRTEC) 10 MG tablet    Sig: Take 1 tablet (10 mg total) by mouth daily.    Dispense:  90 tablet    Refill:  4   rosuvastatin (CRESTOR) 40 MG tablet    Sig: Take 1 tablet (40 mg total) by mouth daily.    Dispense:  90 tablet    Refill:  3    Referral Orders  No referral(s) requested today     Note is dictated utilizing voice recognition software. Although note has been proof read prior to signing, occasional typographical errors still can be missed. If any questions arise, please do not hesitate to call for verification.   electronically signed by:  Howard Pouch, DO  [place]

## 2021-07-06 NOTE — Patient Instructions (Signed)
  Great to see you today.  I have refilled the medication(s) we provide.   If labs were collected, we will inform you of lab results once received either by echart message or telephone call.   - echart message- for normal results that have been seen by the patient already.   - telephone call: abnormal results or if patient has not viewed results in their echart.   Next appt mid March- schedule this as your physical.

## 2021-07-18 ENCOUNTER — Other Ambulatory Visit: Payer: Self-pay | Admitting: Podiatry

## 2021-07-18 MED ORDER — OXYCODONE-ACETAMINOPHEN 10-325 MG PO TABS: 1.0000 | ORAL_TABLET | Freq: Three times a day (TID) | ORAL | 0 refills | Status: AC | PRN

## 2021-07-18 MED ORDER — CEPHALEXIN 500 MG PO CAPS: 500.0000 mg | ORAL_CAPSULE | Freq: Three times a day (TID) | ORAL | 0 refills | Status: DC

## 2021-07-18 MED ORDER — ONDANSETRON HCL 4 MG PO TABS: 4.0000 mg | ORAL_TABLET | Freq: Three times a day (TID) | ORAL | 0 refills | Status: DC | PRN

## 2021-07-20 DIAGNOSIS — Q742 Other congenital malformations of lower limb(s), including pelvic girdle: Secondary | ICD-10-CM | POA: Diagnosis not present

## 2021-07-24 ENCOUNTER — Telehealth: Payer: Self-pay | Admitting: *Deleted

## 2021-07-25 NOTE — Telephone Encounter (Signed)
Patient has an appointment on Thursday and she has no means of transportation,requesting to do a virtual visit. Please advise.

## 2021-07-26 ENCOUNTER — Encounter: Payer: Self-pay | Admitting: Podiatry

## 2021-07-26 ENCOUNTER — Ambulatory Visit (INDEPENDENT_AMBULATORY_CARE_PROVIDER_SITE_OTHER): Payer: Self-pay | Admitting: Podiatry

## 2021-07-26 ENCOUNTER — Other Ambulatory Visit: Payer: Self-pay

## 2021-07-26 ENCOUNTER — Ambulatory Visit (INDEPENDENT_AMBULATORY_CARE_PROVIDER_SITE_OTHER): Payer: 59

## 2021-07-26 VITALS — BP 149/77 | HR 56 | Temp 98.3°F

## 2021-07-26 DIAGNOSIS — M2141 Flat foot [pes planus] (acquired), right foot: Secondary | ICD-10-CM

## 2021-07-26 DIAGNOSIS — M2142 Flat foot [pes planus] (acquired), left foot: Secondary | ICD-10-CM

## 2021-07-26 DIAGNOSIS — Q742 Other congenital malformations of lower limb(s), including pelvic girdle: Secondary | ICD-10-CM

## 2021-07-26 DIAGNOSIS — Z9889 Other specified postprocedural states: Secondary | ICD-10-CM

## 2021-07-26 DIAGNOSIS — M79676 Pain in unspecified toe(s): Secondary | ICD-10-CM

## 2021-07-26 NOTE — Progress Notes (Signed)
She presents today for her first postop visit she is status post Kidner procedure with excision of os naviculare and cast application.  States that I feel pain on the inside is not too bad and he really has a bother me too much as she presents today on a walker.  Objective: Vital signs are stable she is alert and oriented x3 cast is intact and dry and clean on the bottom.  It is loose at the top and fitting around the ankle.  She has good range of motion of her toes with good sensation and good capillary fill time.  Assessment: Well-healing Kidner procedure.  Plan: Follow-up with her in 1 week for an x-ray of the foot and a cast removal with new cast application.

## 2021-08-02 ENCOUNTER — Encounter: Payer: Self-pay | Admitting: Podiatry

## 2021-08-02 ENCOUNTER — Other Ambulatory Visit: Payer: Self-pay

## 2021-08-02 ENCOUNTER — Ambulatory Visit (INDEPENDENT_AMBULATORY_CARE_PROVIDER_SITE_OTHER): Payer: 59 | Admitting: Podiatry

## 2021-08-02 DIAGNOSIS — Q742 Other congenital malformations of lower limb(s), including pelvic girdle: Secondary | ICD-10-CM | POA: Diagnosis not present

## 2021-08-02 DIAGNOSIS — M2142 Flat foot [pes planus] (acquired), left foot: Secondary | ICD-10-CM

## 2021-08-02 DIAGNOSIS — M2141 Flat foot [pes planus] (acquired), right foot: Secondary | ICD-10-CM

## 2021-08-02 DIAGNOSIS — Z9889 Other specified postprocedural states: Secondary | ICD-10-CM

## 2021-08-02 NOTE — Progress Notes (Signed)
She presents today with her husband for her second postop visit she is status post posterior tibial tendon repair with excision of os naviculare.  Date of surgery 07/20/2021 she denies fever chills nausea vomiting muscle aches and pains.  States that she has recently purchased a knee scooter and is enjoying that a lot more than the crutches and the walker.  Objective: Vital signs are stable oriented x3 cast was intact was removed today demonstrates mild edema to the forefoot and to the leg she has no calf pain.  Staples are intact margins well coapted I see no signs of infection.  Assessment: Well-healing surgical foot left.  Plan: Redressed the foot today dressed a compressive dressing prepared it for casting recasted today smaller lighter weight cast.  Continued use of the knee scooter nonweightbearing keep the foot dry and clean I will follow-up with her in 2 weeks for cast removal and possible application of walking boot which we will remain nonweightbearing for at least 4 more weeks from today.

## 2021-08-14 ENCOUNTER — Ambulatory Visit (INDEPENDENT_AMBULATORY_CARE_PROVIDER_SITE_OTHER): Payer: 59 | Admitting: Podiatry

## 2021-08-14 ENCOUNTER — Other Ambulatory Visit: Payer: Self-pay

## 2021-08-14 DIAGNOSIS — Q742 Other congenital malformations of lower limb(s), including pelvic girdle: Secondary | ICD-10-CM | POA: Diagnosis not present

## 2021-08-14 NOTE — Progress Notes (Signed)
She presents today she is status post posterior tibial tendon repair with excision os naviculare left foot.  Denies fever chills nausea vomiting muscle aches pains calf pain back pain chest pain shortness of breath does state that she slipped and pulled the foot even in the cast.  Objective: Cast is intact dry clean once removed demonstrates staples are intact margins well coapted staples were removed today no purulence no malodor it is mild edema no erythema cellulitis drainage or odor.  She has good inversion against resistance dorsiflexion plantarflexion are good as well as abduction.  Assessment: Well-healing surgical foot.  Plan: Placed in a compression anklet today to be followed by a cam boot she will continue nonweightbearing status her only liberties are to shower seated, perform small physical therapy activities which I demonstrated to her and also apply ice directly to the area.  I will follow-up with her in 2 weeks at which time we hope to be able to start walking.

## 2021-08-28 ENCOUNTER — Encounter: Payer: 59 | Admitting: Podiatry

## 2021-08-30 ENCOUNTER — Encounter: Payer: Self-pay | Admitting: Podiatry

## 2021-08-30 ENCOUNTER — Encounter: Payer: 59 | Admitting: Podiatry

## 2021-08-30 ENCOUNTER — Other Ambulatory Visit: Payer: Self-pay

## 2021-08-30 ENCOUNTER — Ambulatory Visit (INDEPENDENT_AMBULATORY_CARE_PROVIDER_SITE_OTHER): Payer: 59 | Admitting: Podiatry

## 2021-08-30 DIAGNOSIS — Q742 Other congenital malformations of lower limb(s), including pelvic girdle: Secondary | ICD-10-CM

## 2021-08-30 DIAGNOSIS — Z9889 Other specified postprocedural states: Secondary | ICD-10-CM

## 2021-08-30 DIAGNOSIS — M2142 Flat foot [pes planus] (acquired), left foot: Secondary | ICD-10-CM

## 2021-08-30 DIAGNOSIS — M2141 Flat foot [pes planus] (acquired), right foot: Secondary | ICD-10-CM

## 2021-09-01 NOTE — Progress Notes (Signed)
She presents today date of surgery 07/20/2021 status post excision of os naviculare a and repair of the posterior tibial tendon.  She states is still tender on the shin.  She presents with her daughter today utilizing a knee scooter.  Objective: Vital signs are stable alert oriented x3 once we took the cam walker off is noted that she had a Tri-Lock brace under a cam walker.  She has good inversion against resistance she has a palpable margin of the posterior tibial tendon.  Assessment: Well-healing surgical foot and ankle.  Plan: Recommend should you continue to utilize the compression anklet the Tri-Lock brace with tennis shoes or the cam walker.  She does not wear both at the same time.  And she is to start with partial weightbearing this week progressing to full weightbearing over the next couple weeks.

## 2021-09-13 ENCOUNTER — Ambulatory Visit (INDEPENDENT_AMBULATORY_CARE_PROVIDER_SITE_OTHER): Payer: 59 | Admitting: Podiatry

## 2021-09-13 ENCOUNTER — Other Ambulatory Visit: Payer: Self-pay

## 2021-09-13 DIAGNOSIS — Z9889 Other specified postprocedural states: Secondary | ICD-10-CM

## 2021-09-13 NOTE — Progress Notes (Signed)
She presents today date of surgery 07/20/2021 excision os naviculare with a Designer, television/film set.  She states that is still unhappy to apply a lot of pressure to it continues to wear the cam walker has tried the brace with a tennis shoe some but states that it causes too much swelling and soreness.  Objective: Vital signs are stable alert oriented x3 moderately edematous but she has good inversion against resistance.  Is a 4 out of 5 muscle strength there.  I think we just need to get the swelling out of the surgical site and the edema that is present.  Assessment: Slowly healing Kidner procedure  Plan: Encouraged her to wear the Tri-Lock brace in the tennis shoes much as possible to try to milk the swelling from the foot is much as possible.  We are referring her to come in physical therapy.

## 2021-09-20 ENCOUNTER — Ambulatory Visit: Payer: 59 | Attending: Podiatry

## 2021-09-20 ENCOUNTER — Other Ambulatory Visit: Payer: Self-pay

## 2021-09-20 DIAGNOSIS — M6281 Muscle weakness (generalized): Secondary | ICD-10-CM | POA: Diagnosis present

## 2021-09-20 DIAGNOSIS — M25572 Pain in left ankle and joints of left foot: Secondary | ICD-10-CM | POA: Insufficient documentation

## 2021-09-20 DIAGNOSIS — R262 Difficulty in walking, not elsewhere classified: Secondary | ICD-10-CM | POA: Diagnosis present

## 2021-09-20 DIAGNOSIS — R2681 Unsteadiness on feet: Secondary | ICD-10-CM | POA: Diagnosis present

## 2021-09-20 DIAGNOSIS — Z9889 Other specified postprocedural states: Secondary | ICD-10-CM | POA: Diagnosis not present
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
## 2021-09-25 ENCOUNTER — Telehealth: Payer: Self-pay | Admitting: Podiatry

## 2021-09-25 ENCOUNTER — Other Ambulatory Visit: Payer: Self-pay | Admitting: Podiatry

## 2021-09-25 MED ORDER — OXYCODONE-ACETAMINOPHEN 10-325 MG PO TABS: 1.0000 | ORAL_TABLET | Freq: Three times a day (TID) | ORAL | 0 refills | Status: AC | PRN

## 2021-09-25 NOTE — Telephone Encounter (Signed)
Patient called and stated that she needed a refill on Oxy pain meds to alleviate the pain. She also is wanted to know when she will be released back to work.   Please advise

## 2021-09-26 ENCOUNTER — Telehealth: Payer: Self-pay | Admitting: *Deleted

## 2021-09-26 NOTE — Telephone Encounter (Signed)
Patient is calling,wanting to speak to physician concerning returning back to work and starting therapy,her disability is about to stop,called her office today and has to now use all of her vacation and she does not want to do that. Her job has come up with a plan that she will not have to walk a lot. She would like to remain out for a week and a half then return with those conditions if possible. Please advise.

## 2021-09-26 NOTE — Telephone Encounter (Signed)
Notes have been sent over for her short term disability, im not sure if patient has long term disability. I told Estill Bamberg to tell patient to speak with her Human Resources to see if she has long term. Her leave has been extended until 11/21/2021, dependant on recovery.

## 2021-10-02 ENCOUNTER — Ambulatory Visit: Payer: Self-pay | Admitting: Physical Therapy

## 2021-10-03 NOTE — Telephone Encounter (Signed)
Called patient to give recommendations per Dr Clyde Lundborg understanding, is going back to work on 10/09/21. She wanted to let the physician know that she cannot afford the PT at this time, new insurance will not cover but is doing exercises given at her first PT appointment. Her foot is getting better,still tenderness, redness when coming out of shower,can walk with one crutch,able to balance on both feet.  Encouraged her to schedule f/u appointment as soon as possible.

## 2021-10-04 ENCOUNTER — Ambulatory Visit: Payer: Self-pay | Admitting: Physical Therapy

## 2021-10-07 ENCOUNTER — Other Ambulatory Visit: Payer: Self-pay | Admitting: Internal Medicine

## 2021-10-07 DIAGNOSIS — R0609 Other forms of dyspnea: Secondary | ICD-10-CM

## 2021-10-09 ENCOUNTER — Ambulatory Visit: Payer: Self-pay | Admitting: Physical Therapy

## 2021-10-11 ENCOUNTER — Ambulatory Visit: Payer: Self-pay | Admitting: Physical Therapy

## 2021-10-16 ENCOUNTER — Ambulatory Visit: Payer: Self-pay

## 2021-10-16 ENCOUNTER — Other Ambulatory Visit: Payer: Self-pay

## 2021-10-16 ENCOUNTER — Ambulatory Visit: Payer: 59

## 2021-10-16 ENCOUNTER — Encounter: Payer: Self-pay | Admitting: Podiatry

## 2021-10-16 ENCOUNTER — Ambulatory Visit (INDEPENDENT_AMBULATORY_CARE_PROVIDER_SITE_OTHER): Payer: 59 | Admitting: Podiatry

## 2021-10-16 DIAGNOSIS — Z9889 Other specified postprocedural states: Secondary | ICD-10-CM

## 2021-10-16 DIAGNOSIS — M76822 Posterior tibial tendinitis, left leg: Secondary | ICD-10-CM

## 2021-10-16 DIAGNOSIS — M722 Plantar fascial fibromatosis: Secondary | ICD-10-CM

## 2021-10-16 DIAGNOSIS — M2141 Flat foot [pes planus] (acquired), right foot: Secondary | ICD-10-CM

## 2021-10-16 DIAGNOSIS — I872 Venous insufficiency (chronic) (peripheral): Secondary | ICD-10-CM

## 2021-10-16 NOTE — Progress Notes (Signed)
She presents today for follow-up of her Kidner procedure on she states that pretty much remains the same still swollen and stays painful about a 5 out of 10 constant pain she states that she tries to walk and work but there is still making her stay up on her foot..  She states that she only did 1 physical therapy because it was too expensive.  She is continues to use oxycodone bracing and icing.  Objective: Vital signs are stable oriented x3 she has good inversion against resistance her only place is tender is right on the navicular tuberosity.  She still has pitting edema of the left leg.  But no calf pain.  Assessment: Slowly resolving/improving posterior tibial tendon repair with a Kidner and transfer.  She will continue to wear the Tri-Lock brace with her tennis shoes unguinal put her in a high compression garment and I recommended that she start back on her meloxicam.  May need to consider venous insufficiency exam next visit may need to consider continuing out of work for at least another month may need to consider gabapentin.

## 2021-10-16 NOTE — Progress Notes (Signed)
SITUATION Reason for Consult: Evaluation for Bilateral Custom Foot Orthoses Patient / Caregiver Report: Patient is ready for foot orthotics  OBJECTIVE DATA: Patient History / Diagnosis:    ICD-10-CM   1. Posterior tibial tendinitis of left lower extremity  M76.822     2. Pes planus of both feet  M21.41    M21.42     3. Plantar fasciitis  M72.2     4. Status post left foot surgery  Z98.890       Current or Previous Devices: None  Foot Examination: Skin presentation:   Intact Ulcers & [place]:   None and no history Toe / Foot Deformities:  Pes planus Weight Bearing Presentation:  Planus Sensation:    Intact  ORTHOTIC RECOMMENDATION Recommended Device: 1x pair of custom functional foot orthotics  GOALS OF ORTHOSES - Reduce Pain - Prevent Foot Deformity - Prevent Progression of Further Foot Deformity - Relieve Pressure - Improve the Overall Biomechanical Function of the Foot and Lower Extremity.  ACTIONS PERFORMED Patient was casted for Foot Orthoses via crush box. Procedure was explained and patient tolerated procedure well. All questions were answered and concerns addressed.  PLAN Potential out of pocket cost was communicated to patient. Casts are to be sent to [facility] for fabrication. Patient is to be called for fitting when devices are ready.

## 2021-10-18 ENCOUNTER — Ambulatory Visit: Payer: Self-pay

## 2021-10-19 ENCOUNTER — Ambulatory Visit (HOSPITAL_COMMUNITY)
Admission: RE | Admit: 2021-10-19 | Discharge: 2021-10-19 | Disposition: A | Discharge status: Home / Self Care | Payer: 59 | Source: Ambulatory Visit | Attending: Podiatry | Admitting: Podiatry

## 2021-10-19 ENCOUNTER — Other Ambulatory Visit: Payer: Self-pay

## 2021-10-19 DIAGNOSIS — I872 Venous insufficiency (chronic) (peripheral): Secondary | ICD-10-CM

## 2021-10-22 ENCOUNTER — Telehealth: Payer: Self-pay | Admitting: *Deleted

## 2021-10-22 NOTE — Telephone Encounter (Signed)
Patient is calling for Vas [place] results from 10/19/21. Please advise.

## 2021-10-23 ENCOUNTER — Ambulatory Visit: Payer: Self-pay

## 2021-10-25 ENCOUNTER — Ambulatory Visit: Payer: Self-pay

## 2021-11-02 ENCOUNTER — Encounter: Payer: Self-pay | Admitting: Podiatry

## 2021-11-02 ENCOUNTER — Telehealth: Payer: Self-pay | Admitting: *Deleted

## 2021-11-02 NOTE — Telephone Encounter (Signed)
Patient called - her out of work note runs out today and wondering what the next step is.

## 2021-11-02 NOTE — Telephone Encounter (Signed)
Mary Escobar - extend out of work until March 6th  [name]- call her when her orthotics come in and also schedule one with [facility] same day for POV

## 2021-11-02 NOTE — Telephone Encounter (Deleted)
Mary Escobar - extend out of work until March 6th  Mary Escobar

## 2021-11-22 ENCOUNTER — Ambulatory Visit (INDEPENDENT_AMBULATORY_CARE_PROVIDER_SITE_OTHER): Payer: 59 | Admitting: Podiatry

## 2021-11-22 ENCOUNTER — Other Ambulatory Visit: Payer: Self-pay

## 2021-11-22 DIAGNOSIS — Z9889 Other specified postprocedural states: Secondary | ICD-10-CM | POA: Diagnosis not present

## 2021-11-22 DIAGNOSIS — M2141 Flat foot [pes planus] (acquired), right foot: Secondary | ICD-10-CM | POA: Diagnosis not present

## 2021-11-22 DIAGNOSIS — M76822 Posterior tibial tendinitis, left leg: Secondary | ICD-10-CM

## 2021-11-22 DIAGNOSIS — M2142 Flat foot [pes planus] (acquired), left foot: Secondary | ICD-10-CM

## 2021-11-22 DIAGNOSIS — M722 Plantar fascial fibromatosis: Secondary | ICD-10-CM | POA: Diagnosis not present

## 2021-11-22 MED ORDER — CELECOXIB 200 MG PO CAPS: 200.0000 mg | ORAL_CAPSULE | Freq: Two times a day (BID) | ORAL | 3 refills | Status: DC

## 2021-11-22 MED ORDER — OXYCODONE-ACETAMINOPHEN 10-325 MG PO TABS: 1.0000 | ORAL_TABLET | Freq: Three times a day (TID) | ORAL | 0 refills | Status: AC | PRN

## 2021-11-22 NOTE — Progress Notes (Signed)
She presents today to pick up her orthotics and to go back to work.  She states that she is ready to go back to work she notices [place] and she is going to have some pain.  States that she does relate some pain right and here she points to the subtalar joint area.  Still has some tenderness in the posterior tibial tendon area but not as much. ? ?Objective: Vital signs are stable alert oriented x3 has planovalgus some residual tenderness of the posterior tibial tendon after repair.  Much decrease in edema from previous evaluation.  Still has some tenderness on palpation of the sinus tarsi left foot. ? ?Assessment: Resolving posterior tibial tendon repair left. ? ?Plan: We will let her get back into her orthotics and tennis shoes I will follow-up with in about 6 weeks she will try to get back to work next week if she has problems with this she will notify us.  If this surgery fails to correct her pain.  With her foot deformity it may be necessary flatfoot repair or rear foot fusion. ?

## 2021-12-06 ENCOUNTER — Other Ambulatory Visit: Payer: Self-pay

## 2021-12-06 MED ORDER — BUPROPION HCL ER (XL) 300 MG PO TB24: 300.0000 mg | ORAL_TABLET | Freq: Every day | ORAL | 0 refills | Status: DC

## 2021-12-11 ENCOUNTER — Encounter: Payer: 59 | Admitting: Family Medicine

## 2021-12-14 ENCOUNTER — Telehealth: Payer: Self-pay | Admitting: *Deleted

## 2021-12-14 NOTE — Telephone Encounter (Signed)
Patient is calling , is returning back to work having really bad swelling, she is wearing orthotics and compression socks, please advise ?

## 2021-12-17 NOTE — Telephone Encounter (Signed)
Patient wanted to return to work. Continue wearing compression and orthotics. If having trouble, reappointment for evaluation. ?

## 2021-12-18 NOTE — Telephone Encounter (Signed)
Post surgical heel of foot is causing problems when getting up in middle of night,feels like she is walking on bone,also her compression sock is causing tightness and discomfort below the knee, would like a sooner appointment if possible to be revaluated.

## 2021-12-19 NOTE — Telephone Encounter (Signed)
Left message for pt that Dr Milinda Pointer is full in the [place] location until 4.13 but I did put pt on waitlist if we have any cancelations. ?

## 2022-01-01 ENCOUNTER — Other Ambulatory Visit: Payer: Self-pay | Admitting: *Deleted

## 2022-01-01 DIAGNOSIS — I7121 Aneurysm of the ascending aorta, without rupture: Secondary | ICD-10-CM

## 2022-01-03 ENCOUNTER — Ambulatory Visit (INDEPENDENT_AMBULATORY_CARE_PROVIDER_SITE_OTHER): Payer: 59 | Admitting: Podiatry

## 2022-01-03 ENCOUNTER — Encounter: Payer: Self-pay | Admitting: Podiatry

## 2022-01-03 DIAGNOSIS — M66872 Spontaneous rupture of other tendons, left ankle and foot: Secondary | ICD-10-CM

## 2022-01-03 DIAGNOSIS — M2142 Flat foot [pes planus] (acquired), left foot: Secondary | ICD-10-CM

## 2022-01-03 DIAGNOSIS — M7752 Other enthesopathy of left foot: Secondary | ICD-10-CM | POA: Diagnosis not present

## 2022-01-03 DIAGNOSIS — M76822 Posterior tibial tendinitis, left leg: Secondary | ICD-10-CM

## 2022-01-06 NOTE — Progress Notes (Signed)
Melia presents today date of surgery July 20, 2021 Kidner procedure excision os naviculare and cast.  States the inside of my foot is swollen still and it itches.  The outside has electrical-like sensations and pain. ? ?Objective: Vital signs are stable she is alert oriented x3.  When asked if she had taken the Celebrex she states that she did not take it because she was worried about side effects.  She said they scared her.  States that she still has lateral foot pain.  The majority of her foot pain is overlying the fourth fifth cuboid articulation.  She still has severe pes planus.  And her inversion ability of her posterior tibial tendon is limited and is not very strong. ? ?Initially after surgery her foot was rectus her posterior tibial tendon appears to have weakened or even torn even further allowing for pes planovalgus deformity to develop.  She also has pain on palpation of the subtalar joint on end range of motion very much like capsulitis or osteoarthritis. ? ?Assessment: At this point severe pes planovalgus and failure of the repair of the posterior tibial tendon. ? ?Plan: Currently requesting MRI of the midfoot and rear foot for surgical evaluation of a triple arthrodesis or flatfoot deformity repair. ?

## 2022-01-09 ENCOUNTER — Telehealth: Payer: Self-pay | Admitting: *Deleted

## 2022-01-09 NOTE — Telephone Encounter (Addendum)
Patient has [employer] and facility will not take her insurance. Will fax to [facility] once order location has been changed to reflect the new location. ?Changed order to [facility] per patient's request,resent MRI order to [employer] for prior authorization. ?

## 2022-01-09 NOTE — Telephone Encounter (Signed)
Thank you :)

## 2022-01-09 NOTE — Telephone Encounter (Signed)
Patient has pre authorized (# [id number]) for the MRI lower ext. w/wo contrast ,valid from 01/09/22-04/10/22. ?Molli Posey to notify and they have schedule patient for April 26 th @ 5:00 pm,arrival time is 4;30 pm. Patient has been notified. ?

## 2022-01-16 ENCOUNTER — Ambulatory Visit (HOSPITAL_COMMUNITY)
Admission: RE | Admit: 2022-01-16 | Discharge: 2022-01-16 | Disposition: A | Discharge status: Home / Self Care | Payer: 59 | Source: Ambulatory Visit | Attending: Podiatry | Admitting: Podiatry

## 2022-01-16 DIAGNOSIS — M7752 Other enthesopathy of left foot: Secondary | ICD-10-CM | POA: Insufficient documentation

## 2022-01-16 DIAGNOSIS — M2142 Flat foot [pes planus] (acquired), left foot: Secondary | ICD-10-CM | POA: Insufficient documentation

## 2022-01-16 DIAGNOSIS — M66872 Spontaneous rupture of other tendons, left ankle and foot: Secondary | ICD-10-CM | POA: Insufficient documentation

## 2022-01-16 IMAGING — MR MR ANKLE*L* W/O CM
5 series · 37 of 40 positions shown · non-contrast
Comparison: MRI [DATE]

CLINICAL DATA: Medial left ankle pain. Os navicular excision 6
months ago. Pes planovalgus alignment

EXAM:
MRI OF THE LEFT ANKLE WITHOUT CONTRAST
TECHNIQUE: Multiplanar, multisequence MR imaging of the ankle was performed. No
intravenous contrast was administered.

[Series 3: T2 fat-sat · axial · left · 4.0mm · 0.42mm/px · z∈[-87,+38]mm · 8 of 26 slices shown (1 of 2)]
[im 1/26]
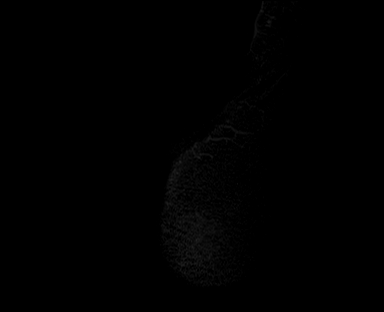
[im 4/26]
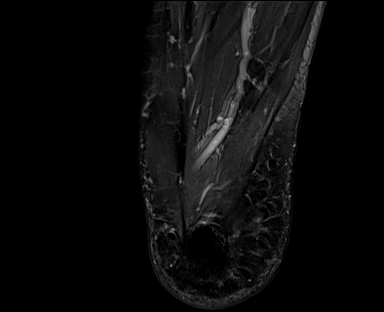
[im 8/26]
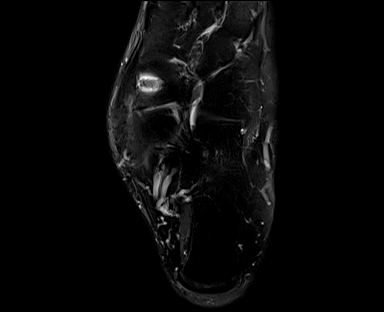
[im 11/26]
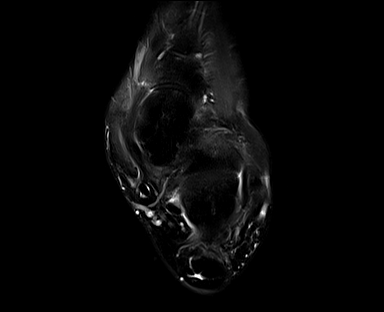
[im 15/26]
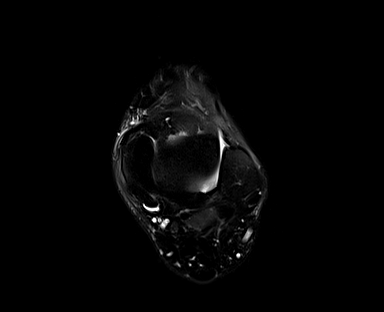
[im 18/26]
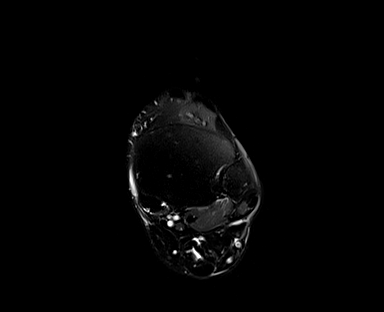
[im 22/26]
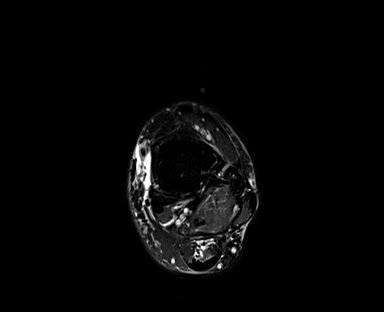
[im 26/26]
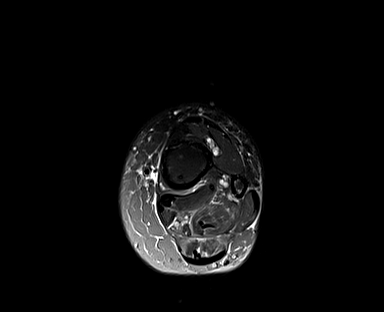

[Series 4: PD fat-sat · axial · left · 4.0mm · 0.50mm/px · z∈[-87,+38]mm · 8 of 26 slices shown]
[im 1/26]
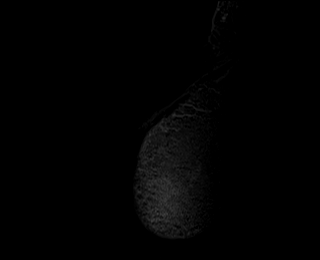
[im 4/26]
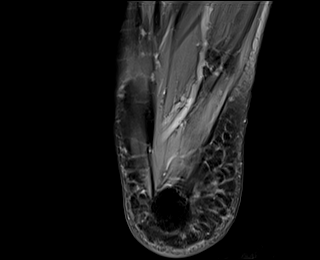
[im 8/26]
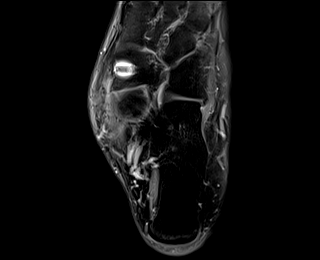
[im 11/26]
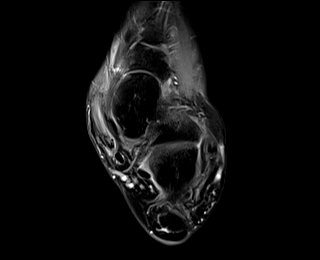
[im 15/26]
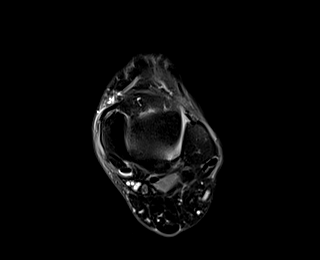
[im 18/26]
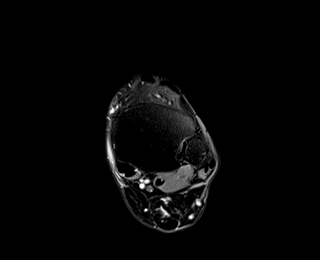
[im 22/26]
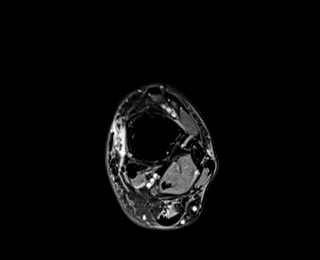
[im 26/26]
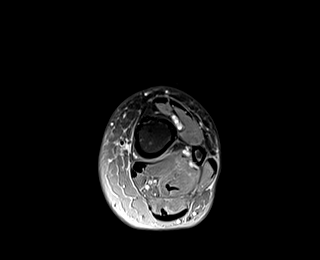

[Series 5: T2 fat-sat · coronal · left · 3.0mm · 0.50mm/px · 8 of 34 slices shown (2 of 2)]
[im 1/34]
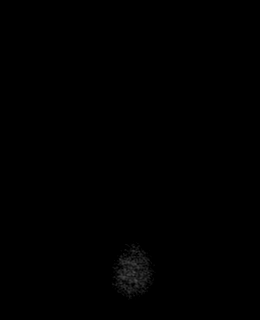
[im 4/34]
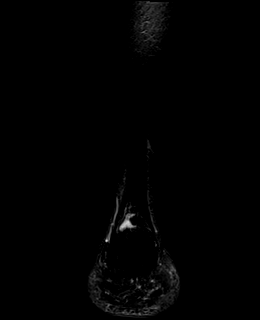
[im 12/34]
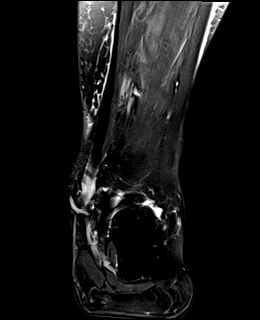
[im 15/34]
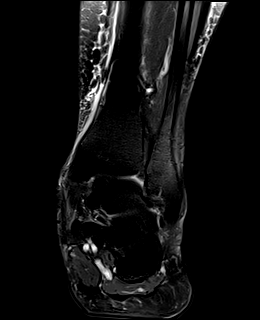
[im 19/34]
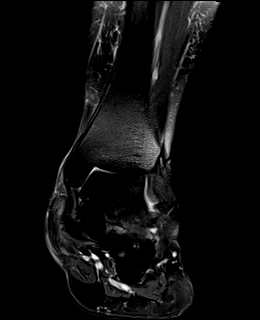
[im 23/34]
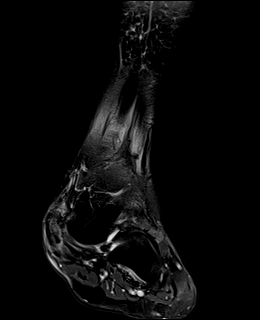
[im 30/34]
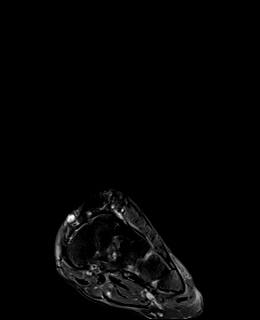
[im 34/34]
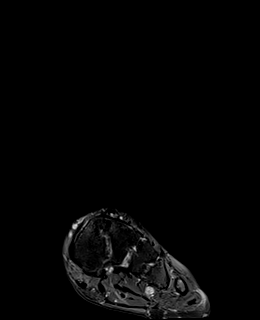

[Series 6: T1 · sagittal · left · 3.0mm · 0.50mm/px · 7 of 24 slices shown]
[im 1/24]
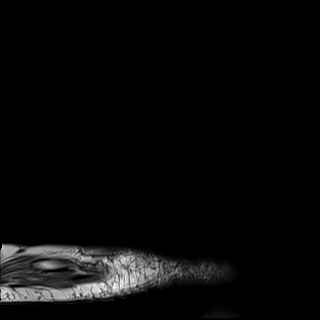
[im 4/24]
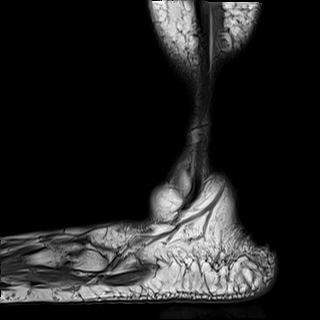
[im 8/24]
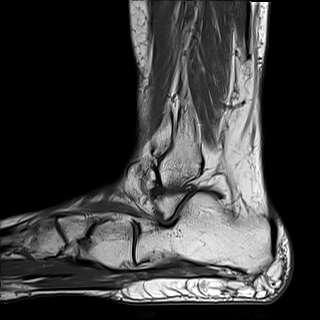
[im 12/24]
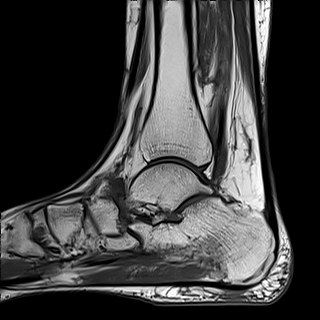
[im 16/24]
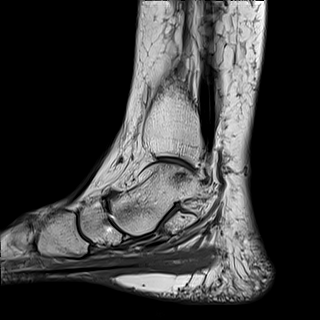
[im 20/24]
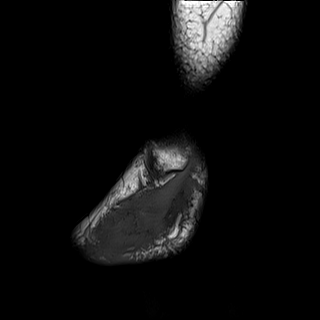
[im 24/24]
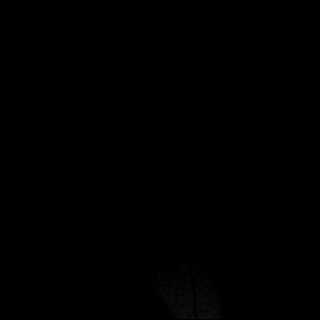

[Series 7: STIR · sagittal · left · 3.0mm · 0.31mm/px · 6 of 24 slices shown]
[im 1/24]
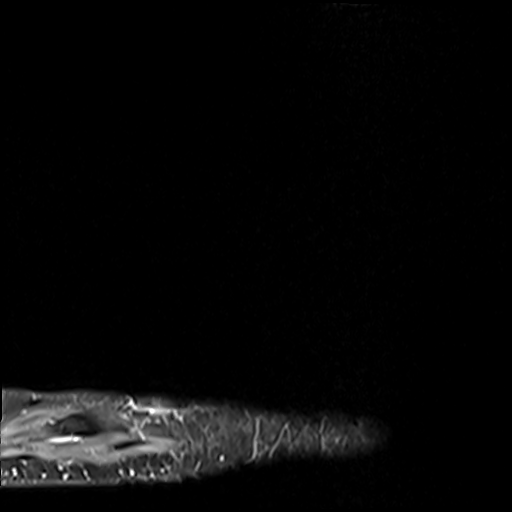
[im 4/24]
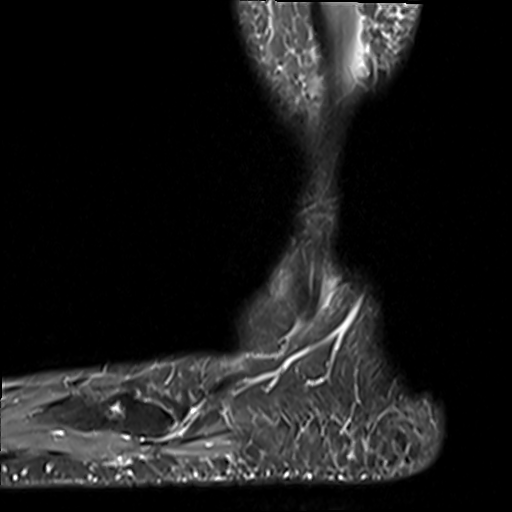
[im 8/24]
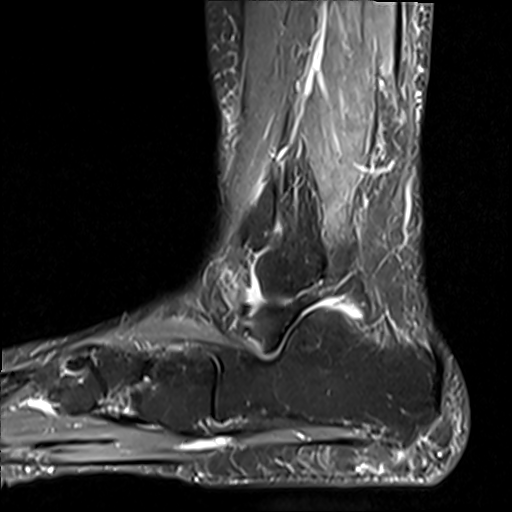
[im 12/24]
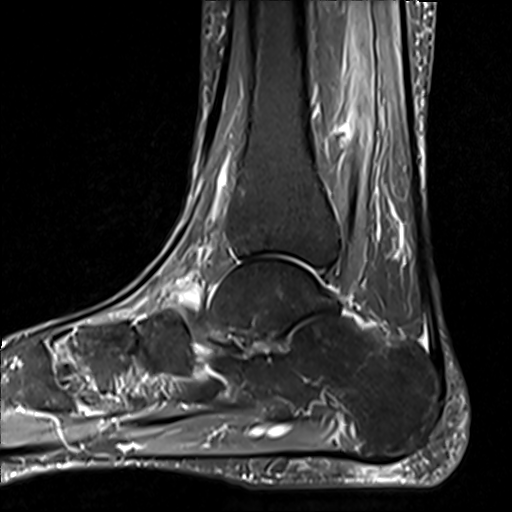
[im 16/24]
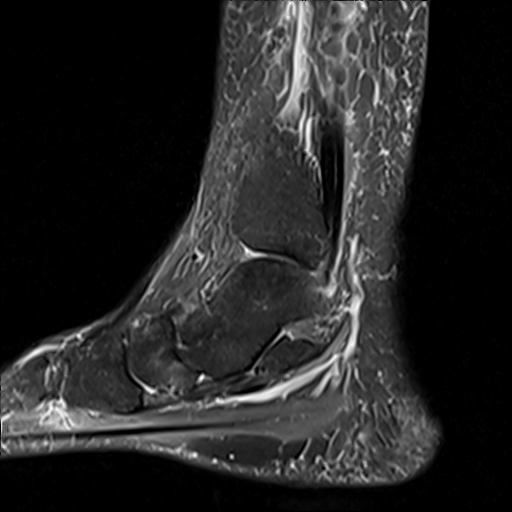
[im 20/24]
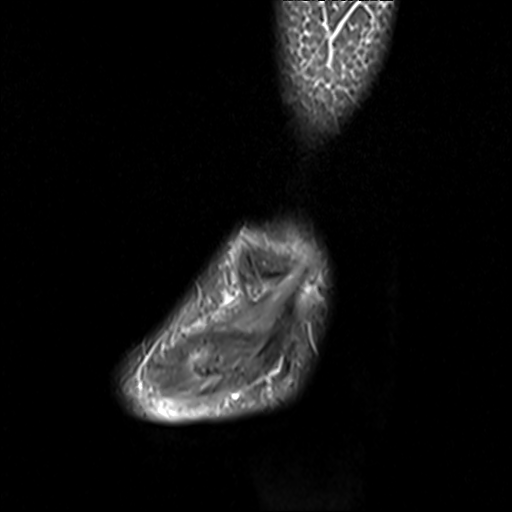

[37 of 40 positions shown; findings below may reference images not displayed]

FINDINGS: TENDONS

Peroneal: Peroneus longus and brevis tendons are intact and normally
positioned.

Posteromedial: Distal aspect of the tibialis posterior tendon is
thickened with intermediate intrasubstance signal with
partial-thickness interstitial tearing. No full-thickness or
retracted tear. Flexor hallucis longus and flexor digitorum longus
tendons are intact. Small amount of fluid within the extensor
digitorum longus tendon sheath.

Anterior: Tibialis anterior, extensor hallucis longus, and extensor
digitorum longus tendons are intact and normally positioned.

Achilles: Intact.

Plantar Fascia: Intact.

LIGAMENTS

Lateral: Intact tibiofibular ligaments. The anterior and posterior
talofibular ligaments are intact. Intact calcaneofibular ligament.

Medial: Thickening of the spring ligament, most pronounced at its
superomedial component. The deltoid ligament appears intact.

CARTILAGE AND BONES

Ankle Joint: No significant ankle joint effusion. The talar dome and
tibial plafond are intact.

Subtalar Joints/Sinus Tarsi: No cartilage defect. No effusion.
Preservation of the anatomic fat within the sinus tarsi.

Bones: Interval resection of accessory navicular. A surgical anchor
is present within the medial aspect of the navicular bone. Pes
planovalgus alignment. No acute fracture. No bone marrow edema. No
suspicious bone lesion.

Other: No significant soft tissue findings.
IMPRESSION: 1. Interval resection of accessory navicular bone.
2. Worsening distal tibialis posterior tendinosis with
partial-thickness interstitial tearing. No full-thickness or
retracted tear.
3. Thickening of the spring ligament, most pronounced at its
superomedial component.
4. Pes planovalgus alignment.

## 2022-01-17 ENCOUNTER — Telehealth: Payer: Self-pay | Admitting: *Deleted

## 2022-01-17 NOTE — Telephone Encounter (Signed)
Patient is calling for the results of her MRI completed 1 day ago, should she schedule an appointment to go over the results.Please advise. ?

## 2022-01-18 NOTE — Telephone Encounter (Signed)
Please schedule patient w/ Dr Sherryle Lis for MRI results appointment per Dr Alvera Novel will be out next week.

## 2022-01-18 NOTE — Telephone Encounter (Signed)
Ammie- this is what Dr Milinda Pointer said on her results! ? ?Have her in to see Dr. Sherryle Lis while Im out if possible.  I want him to take a look at her to see if he thinks a tendon repair would work or if she needs a rear foot fusion. ?

## 2022-01-18 NOTE — Telephone Encounter (Signed)
Pt called back and is scheduled to see Dr Sherryle Lis on 5.8.2023... ?

## 2022-01-28 ENCOUNTER — Ambulatory Visit (INDEPENDENT_AMBULATORY_CARE_PROVIDER_SITE_OTHER): Payer: 59

## 2022-01-28 ENCOUNTER — Ambulatory Visit: Payer: 59 | Admitting: Podiatry

## 2022-01-28 DIAGNOSIS — Z9889 Other specified postprocedural states: Secondary | ICD-10-CM | POA: Diagnosis not present

## 2022-01-28 DIAGNOSIS — M76822 Posterior tibial tendinitis, left leg: Secondary | ICD-10-CM

## 2022-01-28 DIAGNOSIS — M2142 Flat foot [pes planus] (acquired), left foot: Secondary | ICD-10-CM | POA: Diagnosis not present

## 2022-01-28 NOTE — Patient Instructions (Addendum)
?Stretching and range-of-motion exercises ?These exercises warm up your muscles and joints and improve the movement and flexibility in your ankle and foot. These exercises may also help to relieve pain. ?Standing wall calf stretch, knee straight ? ? ?Stand with your hands against a wall. ?Extend your left / right leg behind you, and bend your front knee slightly. If directed, place a folded washcloth under the arch of your foot for support. ?Point the toes of your back foot slightly inward. ?Keeping your heels on the floor and your back knee straight, shift your weight toward the wall. Do not allow your back to arch. You should feel a gentle stretch in your upper left / right calf. ?Hold this position for 10 seconds. ?Repeat 10 times. Complete this exercise 2 times a day. ?Standing wall calf stretch, knee bent ?Stand with your hands against a wall. ?Extend your left / right leg behind you, and bend your front knee slightly. If directed, place a folded washcloth under the arch of your foot for support. ?Point the toes of your back foot slightly inward. ?Unlock your back knee so it is bent. Keep your heels on the floor. You should feel a gentle stretch deep in your lower left / right calf. ?Hold this position for 10 seconds. ?Repeat 10 times. Complete this exercise 2 times a day. ?Strengthening exercises ?These exercises build strength and endurance in your ankle and foot. Endurance is the ability to use your muscles for a long time, even after they get tired. ?Ankle inversion with band ?Secure one end of a rubber exercise band or tubing to a fixed object, such as a table leg or a pole, that will stay still when the band is pulled. ?Loop the other end of the band around the middle of your left / right foot. ?Sit on the floor facing the object with your left / right leg extended. The band or tube should be slightly tense when your foot is relaxed. ?Leading with your big toe, slowly bring your left / right foot and  ankle inward, toward your other foot (inversion). ?Hold this position for 10 seconds. ?Slowly return your foot to the starting position. ?Repeat 10 times. Complete this exercise 2 times a day. ?Towel curls ? ? ?Sit in a chair on a non-carpeted surface, and put your feet on the floor. ?Place a towel in front of your feet. ?Keeping your heel on the floor, put your left / right foot on the towel. ?Pull the towel toward you by grabbing the towel with your toes and curling them under. Keep your heel on the floor while you do this. ?Let your toes relax. ?Grab the towel with your toes again. Keep going until the towel is completely underneath your foot. ?Repeat 10 times. Complete this exercise 2 times a day. ?Balance exercise ?This exercise improves or maintains your balance. Balance is important in preventing falls. ?Single leg stand ?Without wearing shoes, stand near a railing or in a doorway. You may hold on to the railing or door frame as needed for balance. ?Stand on your left / right foot. Keep your big toe down on the floor and try to keep your arch lifted. ?If balancing in this position is too easy, try the exercise with your eyes closed or while standing on a pillow. ?Hold this position for 10 seconds. ?Repeat 10 times. Complete this exercise 2 times a day. ?This information is not intended to replace advice given to you by your health care provider.  Make sure you discuss any questions you have with your health care provider.  ?

## 2022-02-01 ENCOUNTER — Ambulatory Visit: Payer: 59 | Admitting: Physician Assistant

## 2022-02-03 ENCOUNTER — Encounter: Payer: Self-pay | Admitting: Podiatry

## 2022-02-03 NOTE — Progress Notes (Signed)
?  Subjective:  ?Patient ID: Mary Escobar, female    DOB: [birth year],  MRN: [id number] ? ?Chief Complaint  ?Patient presents with  ? Results  ?  mri results  ? ? ?56 y.o. female presents with the above complaint. History confirmed with patient.  She is here for follow-up of her left foot surgery in October 22, she underwent Kidner procedure, has had persistent pain at the site.  Some days are better than others she does have sharp pain occasionally. ? ?Objective:  ?Physical Exam: ?warm, good capillary refill, no trophic changes or ulcerative lesions, normal DP and PT pulses, normal sensory exam, and she has pes planovalgus, more valgus alignment on the left there is edema over the medial ankle, some tenderness to palpation good 5 out of 5 strength ? ? ? ? ? ?Radiographs: ?Multiple views x-ray of the left foot: no fracture, dislocation, swelling or degenerative changes noted and new radiographs taken today show valgus alignment ?Assessment:  ? ?1. Status post left foot surgery   ?2. Posterior tibial tendinitis of left lower extremity   ?3. Pes planovalgus, acquired, left   ? ? ? ?Plan:  ?Patient was evaluated and treated and all questions answered. ? ?I reviewed her postoperative progress so far and my clinical exam findings today as well as the radiographic findings.  She does still have quite a flatfoot on this left side.  She does have decent strength.  I would like her to try aggressive physical therapy to see if we can strengthen the medial tendons and ankle, referral was sent to [facility] in [place].  I will see her back in 6 weeks for reevaluation.  May need to consider additional osteotomies or arthrodesis.  She would be a higher risk surgery candidate for this due to her smoking for bone healing.  We will reevaluate to see if she has any improvement with physical therapy. ? ?Return in about 6 weeks (around 03/11/2022) for f/u left foot surgery .  ? ?

## 2022-02-14 ENCOUNTER — Encounter: Payer: Self-pay | Admitting: *Deleted

## 2022-02-15 ENCOUNTER — Encounter: Payer: 59 | Admitting: Family Medicine

## 2022-02-22 ENCOUNTER — Ambulatory Visit: Payer: 59 | Admitting: Physician Assistant

## 2022-02-26 ENCOUNTER — Other Ambulatory Visit: Payer: 59

## 2022-03-11 ENCOUNTER — Ambulatory Visit: Payer: 59 | Admitting: Podiatry

## 2022-03-19 ENCOUNTER — Ambulatory Visit: Payer: 59 | Admitting: Physician Assistant

## 2022-03-28 ENCOUNTER — Ambulatory Visit: Payer: 59 | Admitting: Podiatry

## 2022-04-01 ENCOUNTER — Telehealth: Payer: Self-pay

## 2022-04-01 NOTE — Telephone Encounter (Signed)
PCP added.

## 2022-04-01 NOTE — Telephone Encounter (Signed)
It would seem by our last note at the end of 2022, I was expecting to see her again.

## 2022-04-01 NOTE — Telephone Encounter (Addendum)
PCP was removed at some point. Please confirm if pt is still your pt. Pt is schedule to have CPE on 04/12/22

## 2022-04-02 ENCOUNTER — Ambulatory Visit (INDEPENDENT_AMBULATORY_CARE_PROVIDER_SITE_OTHER): Payer: 59

## 2022-04-02 ENCOUNTER — Ambulatory Visit: Payer: 59 | Admitting: Podiatry

## 2022-04-02 DIAGNOSIS — M2142 Flat foot [pes planus] (acquired), left foot: Secondary | ICD-10-CM | POA: Diagnosis not present

## 2022-04-02 NOTE — Progress Notes (Signed)
Subjective:  56 y.o. female presenting today for follow-up evaluation of pain and tenderness to the left foot.  Patient does have a history of excision of an accessory navicular with repair of posterior tibial tendon left foot/ankle October 2022.  Patient states that since the surgery she has had continued pain and tenderness to the left foot and ankle.  She actually had a follow-up MRI of the left ankle on 01/16/2022.  She was seen in the office about 6 weeks ago with Dr. Sherryle Lis who recommended physical therapy.  She did not go to physical therapy due to insurance issues so the patient says.  She does home exercises daily however.  She also wears custom orthotics to help support the foot with good supportive shoes.  She has had no significant relief of symptoms and she is experiencing pain on a daily basis despite being almost 1 year out from surgery.  She presents for further treatment and evaluation  Past Medical History:  Diagnosis Date   Allergy    Anxiety    Arthritis    Chronic constipation 01/11/2020   Chronic kidney disease    kidney stones   COPD (chronic obstructive pulmonary disease) (HCC)    DOE (dyspnea on exertion) 01/11/2020   Fainting spell    Family history of premature CAD    Fatigue 2020   multifactoral   GERD (gastroesophageal reflux disease)    History of kidney stones    Hot flashes 02/11/2020   Hyperlipidemia    Hypertension    Hypothyroid    IBS (irritable bowel syndrome)    Kidney stone 08/18/2019   mild nonobstructive CAD on cath 2009    Osteoporosis    Other complicated headache syndrome 12/21/2020   Palpitation    Polyarthralgia 12/08/2018   Smoker    Snoring 09/08/2018   Vitamin D deficiency    Past Surgical History:  Procedure Laterality Date   CORONARY ANGIOGRAM  2009   mild CAD noted after abn GXT   CYSTOSCOPY W/ URETERAL STENT PLACEMENT Right 03/31/2020   Procedure: CYSTOSCOPY WITH STENT PLACEMENT;  Surgeon: Alexis Frock, MD;  Location: [facility]  [facility];  Service: Urology;  Laterality: Right;   CYSTOSCOPY WITH RETROGRADE PYELOGRAM, URETEROSCOPY AND STENT PLACEMENT Right 04/21/2020   Procedure: CYSTOSCOPY WITH RETROGRADE PYELOGRAM, RIGHT URETEROSCOPY;  Surgeon: Alexis Frock, MD;  Location: [facility];  Service: Urology;  Laterality: Right;  1 HR   HOLMIUM LASER APPLICATION Right 5/32/9924   Procedure: HOLMIUM LASER APPLICATION;  Surgeon: Alexis Frock, MD;  Location: [facility];  Service: Urology;  Laterality: Right;   WRIST SURGERY     Allergies  Allergen Reactions   Citalopram Palpitations   Ciprofloxacin Nausea And Vomiting   Effexor [Venlafaxine]     nausea     Objective/Physical Exam General: The patient is alert and oriented x3 in no acute distress.  Dermatology: Skin is warm, dry and supple bilateral lower extremities. Negative for open lesions or macerations.  The skin incision to the medial aspect of the left foot has healed nicely  Vascular: Palpable pedal pulses bilaterally. No edema or erythema noted. Capillary refill within normal limits.  Neurological: Epicritic and protective threshold grossly intact bilaterally.   Musculoskeletal Exam: Range of motion within normal limits to all pedal and ankle joints bilateral. Muscle strength 5/5 in all groups bilateral.  Upon weightbearing there is a medial longitudinal arch collapse noted to the left foot.  Rearfoot valgus noted to the left lower extremity with excessive pronation upon mid  stance.  Radiographic Exam LT foot 04/02/2022:  Normal osseous mineralization. Joint spaces appear to be somewhat preserved. No fracture/dislocation/boney destruction.   Pes planus noted on radiographic exam lateral views. Decreased calcaneal inclination and metatarsal declination angle is noted. Anterior break in the cyma line noted on lateral views. Medial deviation of the talar head  noted on AP radiograph.  There is an anchor within the body of the medial aspect of the navicular from prior PT  tendon repair/accessory navicular exostectomy  MR ANKLE LT WO CONTRAST 01/16/2022: IMPRESSION: 1. Interval resection of accessory navicular bone. 2. Worsening distal tibialis posterior tendinosis with partial-thickness interstitial tearing. No full-thickness or retracted tear. 3. Thickening of the spring ligament, most pronounced at its superomedial component. 4. Pes planovalgus alignment.    Assessment: 1.  Symptomatic pes planus bilateral w/ PTTD 2. PSxHx accessory navicular excision with PT tendon repair LT foot: October 2022 performed by Dr. Laureen Ochs   Plan of Care:  1. Patient was evaluated. X-Rays and prior MRI reviewed.  2.  Unfortunately the patient is almost 10 months postop and continues to have pain and tenderness to the left foot because of the medial longitudinal arch collapse and severe flatfoot deformity of the foot.  She has tried conservative treatment including strengthening/stretching exercises at home as well as custom orthotics with different attempts at different shoe gear modifications.  Unfortunately she has had no success with conservative approach and management. 3.  We did discuss the possibility of revisional surgery.  There are two valid surgical options for the patient: joint preserving surgery which would include FDL tendon transfer to reinforce the PT tendon and medial slide calcaneal osteotomy.  The other surgical option is triple arthrodesis which I believe would offer a more definitive surgical outcome eliminating the possibility of recurrence of collapse of the medial longitudinal arch and flatfoot if the rear foot is fused in the correct anatomical alignment. 4.  Both surgical options were discussed with the patient.  They were explained in detail.  I also explained to the patient this is not an emergent surgery.  Recommend that she goes and discussed the surgery with her family.  She also needs to accrue the appropriate time off of work.  I do believe that  triple arthrodesis would offer her relief from her symptoms and provide long-term resolution of her flatfoot deformity. 5.  The patient is contemplating having surgery next year.  In the meantime continue custom orthotics and good supportive shoes and sneakers 6.  Return to clinic as needed for surgical consult   Edrick Kins, DPM [facility]  Dr. Edrick Kins, DPM    [address], [place] [address]                Office [phone]  Fax [phone]

## 2022-04-04 ENCOUNTER — Other Ambulatory Visit: Payer: Self-pay

## 2022-04-04 MED ORDER — METOPROLOL SUCCINATE ER 25 MG PO TB24: 12.5000 mg | ORAL_TABLET | Freq: Every day | ORAL | 0 refills | Status: DC

## 2022-04-05 ENCOUNTER — Ambulatory Visit: Payer: 59 | Admitting: Nurse Practitioner

## 2022-04-12 ENCOUNTER — Ambulatory Visit (INDEPENDENT_AMBULATORY_CARE_PROVIDER_SITE_OTHER): Payer: 59 | Admitting: Family Medicine

## 2022-04-12 ENCOUNTER — Encounter: Payer: Self-pay | Admitting: Family Medicine

## 2022-04-12 VITALS — BP 126/79 | HR 56 | Temp 98.6°F | Ht 66.73 in | Wt 194.0 lb

## 2022-04-12 DIAGNOSIS — E034 Atrophy of thyroid (acquired): Secondary | ICD-10-CM | POA: Diagnosis not present

## 2022-04-12 DIAGNOSIS — I25119 Atherosclerotic heart disease of native coronary artery with unspecified angina pectoris: Secondary | ICD-10-CM | POA: Diagnosis not present

## 2022-04-12 DIAGNOSIS — R69 Illness, unspecified: Secondary | ICD-10-CM | POA: Diagnosis not present

## 2022-04-12 DIAGNOSIS — Z79899 Other long term (current) drug therapy: Secondary | ICD-10-CM | POA: Diagnosis not present

## 2022-04-12 DIAGNOSIS — Z6830 Body mass index (BMI) 30.0-30.9, adult: Secondary | ICD-10-CM | POA: Diagnosis not present

## 2022-04-12 DIAGNOSIS — J309 Allergic rhinitis, unspecified: Secondary | ICD-10-CM | POA: Diagnosis not present

## 2022-04-12 DIAGNOSIS — Z8249 Family history of ischemic heart disease and other diseases of the circulatory system: Secondary | ICD-10-CM | POA: Diagnosis not present

## 2022-04-12 DIAGNOSIS — E559 Vitamin D deficiency, unspecified: Secondary | ICD-10-CM

## 2022-04-12 DIAGNOSIS — I10 Essential (primary) hypertension: Secondary | ICD-10-CM

## 2022-04-12 DIAGNOSIS — Z0000 Encounter for general adult medical examination without abnormal findings: Secondary | ICD-10-CM | POA: Diagnosis not present

## 2022-04-12 DIAGNOSIS — F419 Anxiety disorder, unspecified: Secondary | ICD-10-CM | POA: Diagnosis not present

## 2022-04-12 MED ORDER — FLUTICASONE PROPIONATE 50 MCG/ACT NA SUSP: 2.0000 | Freq: Two times a day (BID) | NASAL | 6 refills | Status: DC

## 2022-04-12 MED ORDER — ESCITALOPRAM OXALATE 20 MG PO TABS
20.0000 mg | ORAL_TABLET | Freq: Every day | ORAL | 1 refills | Status: DC
Start: 2022-04-12 — End: 2022-10-22

## 2022-04-12 MED ORDER — BUPROPION HCL ER (XL) 300 MG PO TB24: 300.0000 mg | ORAL_TABLET | Freq: Every day | ORAL | 1 refills | Status: DC

## 2022-04-12 MED ORDER — METOPROLOL SUCCINATE ER 25 MG PO TB24: 12.5000 mg | ORAL_TABLET | Freq: Every day | ORAL | 1 refills | Status: DC

## 2022-04-12 MED ORDER — LEVOCETIRIZINE DIHYDROCHLORIDE 5 MG PO TABS: 5.0000 mg | ORAL_TABLET | Freq: Every evening | ORAL | 3 refills | Status: DC

## 2022-04-12 NOTE — Patient Instructions (Signed)
No follow-ups on file.        Great to see you today.  I have refilled the medication(s) we provide.   If labs were collected, we will inform you of lab results once received either by echart message or telephone call.   - echart message- for normal results that have been seen by the patient already.   - telephone call: abnormal results or if patient has not viewed results in their echart.  Health Maintenance, Female Adopting a healthy lifestyle and getting preventive care are important in promoting health and wellness. Ask your health care provider about: The right schedule for you to have regular tests and exams. Things you can do on your own to prevent diseases and keep yourself healthy. What should I know about diet, weight, and exercise? Eat a healthy diet  Eat a diet that includes plenty of vegetables, fruits, low-fat dairy products, and lean protein. Do not eat a lot of foods that are high in solid fats, added sugars, or sodium. Maintain a healthy weight Body mass index (BMI) is used to identify weight problems. It estimates body fat based on height and weight. Your health care provider can help determine your BMI and help you achieve or maintain a healthy weight. Get regular exercise Get regular exercise. This is one of the most important things you can do for your health. Most adults should: Exercise for at least 150 minutes each week. The exercise should increase your heart rate and make you sweat (moderate-intensity exercise). Do strengthening exercises at least twice a week. This is in addition to the moderate-intensity exercise. Spend less time sitting. Even light physical activity can be beneficial. Watch cholesterol and blood lipids Have your blood tested for lipids and cholesterol at 57 years of age, then have this test every 5 years. Have your cholesterol levels checked more often if: Your lipid or cholesterol levels are high. You are older than 57 years of  age. You are at high risk for heart disease. What should I know about cancer screening? Depending on your health history and family history, you may need to have cancer screening at various ages. This may include screening for: Breast cancer. Cervical cancer. Colorectal cancer. Skin cancer. Lung cancer. What should I know about heart disease, diabetes, and high blood pressure? Blood pressure and heart disease High blood pressure causes heart disease and increases the risk of stroke. This is more likely to develop in people who have high blood pressure readings or are overweight. Have your blood pressure checked: Every 3-5 years if you are 18-39 years of age. Every year if you are 40 years old or older. Diabetes Have regular diabetes screenings. This checks your fasting blood sugar level. Have the screening done: Once every three years after age 40 if you are at a normal weight and have a low risk for diabetes. More often and at a younger age if you are overweight or have a high risk for diabetes. What should I know about preventing infection? Hepatitis B If you have a higher risk for hepatitis B, you should be screened for this virus. Talk with your health care provider to find out if you are at risk for hepatitis B infection. Hepatitis C Testing is recommended for: Everyone born from 1945 through 1965. Anyone with known risk factors for hepatitis C. Sexually transmitted infections (STIs) Get screened for STIs, including gonorrhea and chlamydia, if: You are sexually active and are younger than 57 years of age. You are   older than 57 years of age and your health care provider tells you that you are at risk for this type of infection. Your sexual activity has changed since you were last screened, and you are at increased risk for chlamydia or gonorrhea. Ask your health care provider if you are at risk. Ask your health care provider about whether you are at high risk for HIV. Your health  care provider may recommend a prescription medicine to help prevent HIV infection. If you choose to take medicine to prevent HIV, you should first get tested for HIV. You should then be tested every 3 months for as long as you are taking the medicine. Pregnancy If you are about to stop having your period (premenopausal) and you may become pregnant, seek counseling before you get pregnant. Take 400 to 800 micrograms (mcg) of folic acid every day if you become pregnant. Ask for birth control (contraception) if you want to prevent pregnancy. Osteoporosis and menopause Osteoporosis is a disease in which the bones lose minerals and strength with aging. This can result in bone fractures. If you are 65 years old or older, or if you are at risk for osteoporosis and fractures, ask your health care provider if you should: Be screened for bone loss. Take a calcium or vitamin D supplement to lower your risk of fractures. Be given hormone replacement therapy (HRT) to treat symptoms of menopause. Follow these instructions at home: Alcohol use Do not drink alcohol if: Your health care provider tells you not to drink. You are pregnant, may be pregnant, or are planning to become pregnant. If you drink alcohol: Limit how much you have to: 0-1 drink a day. Know how much alcohol is in your drink. In the [place] one drink equals one 12 oz bottle of beer (355 mL), one 5 oz glass of wine (148 mL), or one 1 oz glass of hard liquor (44 mL). Lifestyle Do not use any products that contain nicotine or tobacco. These products include cigarettes, chewing tobacco, and vaping devices, such as e-cigarettes. If you need help quitting, ask your health care provider. Do not use street drugs. Do not share needles. Ask your health care provider for help if you need support or information about quitting drugs. General instructions Schedule regular health, dental, and eye exams. Stay current with your vaccines. Tell your health  care provider if: You often feel depressed. You have ever been abused or do not feel safe at home. Summary Adopting a healthy lifestyle and getting preventive care are important in promoting health and wellness. Follow your health care provider's instructions about healthy diet, exercising, and getting tested or screened for diseases. Follow your health care provider's instructions on monitoring your cholesterol and blood pressure. This information is not intended to replace advice given to you by your health care provider. Make sure you discuss any questions you have with your health care provider. Document Revised: 01/29/2021 Document Reviewed: 01/29/2021 Elsevier Patient Education  2023 [employer]  

## 2022-04-12 NOTE — Progress Notes (Unsigned)
Patient ID: Mary Escobar, female  DOB: Jan 11, 1966, 56 y.o.   MRN: [id number] Patient Care Team    Relationship Specialty Notifications Start End  Ma Hillock, DO PCP - General Family Medicine  04/01/22   Alexis Frock, MD Consulting Physician Urology  06/12/20   [facility]    06/12/20   Kerry Dory, NP Nurse Practitioner Nurse Practitioner  06/12/20    Comment: [facility]  Lelon Perla, MD Consulting Physician Cardiology  04/01/22     Chief Complaint  Patient presents with   Annual Exam    Pt is not fasting    Subjective: Mary Escobar is a 56 y.o.  Female  present for [place] All past medical history, surgical history, allergies, family history, immunizations, medications and social history were updated in the electronic medical record today. All recent labs, ED visits and hospitalizations within the last year were reviewed.  Health maintenance:  Colonoscopy: 01/2021- 3 yr. ***No prior screenings.  Referred to gastroenterology today. Mammogram: completed: 08/23/2019-GYN Cervical cancer screening: last pap: 03/24/2018-gynecology Immunizations: tdap due-updated today, Influenza declined (will receive at work), do not see evidence of a pneumonia vaccine, if records do not indicate it has been given will offer next appointment secondary to smoking history/COPD, COVID series declined Infectious disease screening: Hep C completed DEXA: Completed at gynecology's office per patient.  Will await records Assistive device: *** Oxygen use:*** Patient has a Dental home. Hospitalizations/ED visits: ***     04/12/2022    3:11 PM 07/06/2021    2:09 PM 02/08/2021    8:30 AM 11/20/2020   11:22 AM 06/23/2020    2:35 PM  Depression screen PHQ 2/9  Decreased Interest 0 0 0 0 0  Down, Depressed, Hopeless 0 0 0 0 2  PHQ - 2 Score 0 0 0 0 2  Altered sleeping 3 1  0 3  Tired, decreased energy 3 3  0 3  Change in appetite 0 1  0 1  Feeling bad or  failure about yourself  0 0  0 0  Trouble concentrating 0 2  0 0  Moving slowly or fidgety/restless 0 0  0 0  Suicidal thoughts 0 0  0 0  PHQ-9 Score 6 7  0 9      04/12/2022    3:11 PM 07/06/2021    2:09 PM 06/23/2020    2:36 PM  GAD 7 : Generalized Anxiety Score  Nervous, Anxious, on Edge '2 3 2  '$ Control/stop worrying '2 2 2  '$ Worry too much - different things '2 2 2  '$ Trouble relaxing '2 2 2  '$ Restless 0 0 0  Easily annoyed or irritable '1 2 3  '$ Afraid - awful might happen 0 0 0  Total GAD 7 Score '9 11 11    '$ Immunization History  Administered Date(s) Administered   Influenza Whole 08/12/2006, 08/28/2007   Influenza,inj,Quad PF,6+ Mos 06/17/2018, 05/26/2019, 07/06/2021   Influenza-Unspecified 08/25/2012, 07/15/2013   Tdap 06/09/2020    Past Medical History:  Diagnosis Date   Allergy    Anxiety    Arthritis    Chronic constipation 01/11/2020   Chronic kidney disease    kidney stones   COPD (chronic obstructive pulmonary disease) ([place])    DOE (dyspnea on exertion) 01/11/2020   Fainting spell    Family history of premature CAD    Fatigue 2020   multifactoral   GERD (gastroesophageal reflux disease)    History of kidney  stones    Hot flashes 02/11/2020   Hyperlipidemia    Hypertension    Hypothyroid    IBS (irritable bowel syndrome)    Kidney stone 08/18/2019   mild nonobstructive CAD on cath 2009    Osteoporosis    Other complicated headache syndrome 12/21/2020   Palpitation    Polyarthralgia 12/08/2018   Smoker    Snoring 09/08/2018   Vitamin D deficiency    Allergies  Allergen Reactions   Citalopram Palpitations   Ciprofloxacin Nausea And Vomiting   Effexor [Venlafaxine]     nausea   Past Surgical History:  Procedure Laterality Date   CORONARY ANGIOGRAM  2009   mild CAD noted after abn GXT   CYSTOSCOPY W/ URETERAL STENT PLACEMENT Right 03/31/2020   Procedure: CYSTOSCOPY WITH STENT PLACEMENT;  Surgeon: Alexis Frock, MD;  Location: [facility];  Service: Urology;   Laterality: Right;   CYSTOSCOPY WITH RETROGRADE PYELOGRAM, URETEROSCOPY AND STENT PLACEMENT Right 04/21/2020   Procedure: CYSTOSCOPY WITH RETROGRADE PYELOGRAM, RIGHT URETEROSCOPY;  Surgeon: Alexis Frock, MD;  Location: [facility];  Service: Urology;  Laterality: Right;  1 HR   HOLMIUM LASER APPLICATION Right 9/62/9528   Procedure: HOLMIUM LASER APPLICATION;  Surgeon: Alexis Frock, MD;  Location: [facility];  Service: Urology;  Laterality: Right;   WRIST SURGERY     Family History  Problem Relation Age of Onset   Diabetes Mother    Other Mother        carcinoid tumor   Hyperlipidemia Mother    Arthritis Mother    Hypertension Mother    Colon cancer Mother    CAD Father        MI at age 59   Heart disease Father        CABG x 3   Sleep apnea Father    Arrhythmia Father        pacemaker   COPD Father    Diabetes Father    Thyroid disease Father    Hyperlipidemia Father    Arthritis Father    Hypertension Father    Heart attack Father    Colon polyps Father    Crohn's disease Sister    Arthritis Maternal Grandmother    Diabetes Maternal Grandmother    Heart attack Maternal Grandmother    Heart disease Maternal Grandmother    Arthritis Maternal Grandfather    Heart attack Maternal Grandfather 61       died 61 of MI   Arthritis Paternal Grandmother    Stroke Paternal Grandmother    Arthritis Paternal Grandfather    Heart attack Paternal Grandfather 64       Died of MI   Pulmonary fibrosis Paternal Grandfather    Healthy Daughter    Colon cancer Maternal Aunt    Pulmonary fibrosis Paternal Uncle    Pulmonary fibrosis Paternal Aunt    Esophageal cancer Neg Hx    Stomach cancer Neg Hx    Rectal cancer Neg Hx    Social History   Social History Narrative   Marital status/children/pets: Single, 1 child.    education/employment: 12th grade education, employed as a Biomedical engineer.   Safety:      -smoke alarm in the home:Yes     - wears seatbelt: Yes     - Feels safe in  their relationships: Yes    Allergies as of 04/12/2022       Reactions   Citalopram Palpitations   Ciprofloxacin Nausea And Vomiting   Effexor [venlafaxine]  nausea        Medication List        Accurate as of April 12, 2022  3:13 PM. If you have any questions, ask your nurse or doctor.          albuterol 108 (90 Base) MCG/ACT inhaler Commonly known as: VENTOLIN HFA Inhale 2 puffs into the lungs every 6 (six) hours as needed for wheezing or shortness of breath.   aspirin EC 81 MG tablet Take 81 mg by mouth daily. Swallow whole.   buPROPion 300 MG 24 hr tablet Commonly known as: WELLBUTRIN XL Take 1 tablet (300 mg total) by mouth daily.   celecoxib 200 MG capsule Commonly known as: CeleBREX Take 1 capsule (200 mg total) by mouth 2 (two) times daily.   cetirizine 10 MG tablet Commonly known as: ZYRTEC Take 1 tablet (10 mg total) by mouth daily.   cimetidine 300 MG tablet Commonly known as: TAGAMET Take 300-600 mg by mouth at bedtime.   cyanocobalamin 1000 MCG tablet Take 1,000 mcg by mouth daily.   D3-1000 PO Take by mouth.   escitalopram 20 MG tablet Commonly known as: LEXAPRO Take 1 tablet (20 mg total) by mouth daily.   famotidine 20 MG tablet Commonly known as: Pepcid One after supper   FIBER ADULT GUMMIES PO Take 2 each by mouth daily.   fluticasone 50 MCG/ACT nasal spray Commonly known as: FLONASE Place 2 sprays into both nostrils in the morning and at bedtime.   levothyroxine 75 MCG tablet Commonly known as: Synthroid Take 1 tablet (75 mcg total) by mouth daily. Prior scripts- new provider   MAGNESIUM PO Take 500 mg by mouth daily.   metoprolol succinate 25 MG 24 hr tablet Commonly known as: TOPROL-XL Take 0.5-1 tablets (12.5-25 mg total) by mouth daily.   nitroGLYCERIN 0.4 MG SL tablet Commonly known as: NITROSTAT Place 1 tablet (0.4 mg total) under the tongue every 5 (five) minutes as needed for chest pain.   ondansetron 4 MG  tablet Commonly known as: Zofran Take 1 tablet (4 mg total) by mouth every 8 (eight) hours as needed.   pantoprazole 40 MG tablet Commonly known as: PROTONIX TAKE 1 TABLET(40 MG) BY MOUTH DAILY 30 TO 60 MINUTES BEFORE FIRST MEAL OF THE DAY   predniSONE 20 MG tablet Commonly known as: DELTASONE Take by mouth.   rosuvastatin 40 MG tablet Commonly known as: CRESTOR Take 1 tablet (40 mg total) by mouth daily.   Vitamin D (Ergocalciferol) 1.25 MG (50000 UNIT) Caps capsule Commonly known as: DRISDOL Take 50,000 Units by mouth once a week.        All past medical history, surgical history, allergies, family history, immunizations andmedications were updated in the EMR today and reviewed under the history and medication portions of their EMR.     No results found for this or any previous visit (from the past 2160 hour(s)).   ROS 14 pt review of systems performed and negative (unless mentioned in an HPI)  Objective: BP 126/79   Pulse (!) 56   Temp 98.6 F (37 C)   Ht 5' 6.73" (1.695 m)   Wt 194 lb (88 kg)   LMP  (LMP Unknown)   SpO2 97%   BMI 30.63 kg/m  Physical Exam Vitals and nursing note reviewed.  Constitutional:      General: She is not in acute distress.    Appearance: Normal appearance. She is not ill-appearing or toxic-appearing.  HENT:  Head: Normocephalic and atraumatic.     Right Ear: Tympanic membrane, ear canal and external ear normal. There is no impacted cerumen.     Left Ear: Tympanic membrane, ear canal and external ear normal. There is no impacted cerumen.     Nose: No congestion or rhinorrhea.     Mouth/Throat:     Mouth: Mucous membranes are moist.     Pharynx: Oropharynx is clear. No oropharyngeal exudate or posterior oropharyngeal erythema.  Eyes:     General: No scleral icterus.       Right eye: No discharge.        Left eye: No discharge.     Extraocular Movements: Extraocular movements intact.     Conjunctiva/sclera: Conjunctivae  normal.     Pupils: Pupils are equal, round, and reactive to light.  Cardiovascular:     Rate and Rhythm: Normal rate and regular rhythm.     Pulses: Normal pulses.     Heart sounds: Normal heart sounds. No murmur heard.    No friction rub. No gallop.  Pulmonary:     Effort: Pulmonary effort is normal. No respiratory distress.     Breath sounds: Normal breath sounds. No stridor. No wheezing, rhonchi or rales.  Chest:     Chest wall: No tenderness.  Abdominal:     General: Abdomen is flat. Bowel sounds are normal. There is no distension.     Palpations: Abdomen is soft. There is no mass.     Tenderness: There is no abdominal tenderness. There is no right CVA tenderness, left CVA tenderness, guarding or rebound.     Hernia: No hernia is present.  Musculoskeletal:        General: No swelling, tenderness or deformity. Normal range of motion.     Cervical back: Normal range of motion and neck supple. No rigidity or tenderness.     Right lower leg: No edema.     Left lower leg: No edema.  Lymphadenopathy:     Cervical: No cervical adenopathy.  Skin:    General: Skin is warm and dry.     Coloration: Skin is not jaundiced or pale.     Findings: No bruising, erythema, lesion or rash.  Neurological:     General: No focal deficit present.     Mental Status: She is alert and oriented to person, place, and time. Mental status is at baseline.     Cranial Nerves: No cranial nerve deficit.     Sensory: No sensory deficit.     Motor: No weakness.     Coordination: Coordination normal.     Gait: Gait normal.     Deep Tendon Reflexes: Reflexes normal.  Psychiatric:        Mood and Affect: Mood normal.        Behavior: Behavior normal.        Thought Content: Thought content normal.        Judgment: Judgment normal.      No results found.  Assessment/plan: Mary Escobar is a 56 y.o. female present for CPE ***   Patient was encouraged to exercise greater than 150 minutes a week.  Patient was encouraged to choose a diet filled with fresh fruits and vegetables, and lean meats. AVS provided to patient today for education/recommendation on gender specific health and safety maintenance. No follow-ups on file.  No orders of the defined types were placed in this encounter.   No orders of the defined types were placed in this  encounter.  No orders of the defined types were placed in this encounter.  Referral Orders  No referral(s) requested today     Electronically signed by: Howard Pouch, [place]

## 2022-04-13 LAB — LIPID PANEL
Cholesterol: 117 mg/dL (ref <200)
HDL: 54 mg/dL (ref >=50)
LDL Cholesterol (Calc): 46 mg/dL (calc)
Non-HDL Cholesterol (Calc): 63 mg/dL (calc) (ref <130)
Total CHOL/HDL Ratio: 2.2 (calc) (ref <5.0)
Triglycerides: 85 mg/dL (ref <150)

## 2022-04-13 LAB — COMPREHENSIVE METABOLIC PANEL
AG Ratio: 1.8 (calc) (ref 1.0–2.5)
ALT: 10 U/L (ref 6–29)
AST: 12 U/L (ref 10–35)
Albumin: 4.3 g/dL (ref 3.6–5.1)
Alkaline phosphatase (APISO): 67 U/L (ref 37–153)
BUN: 16 mg/dL (ref 7–25)
CO2: 25 mmol/L (ref 20–32)
Calcium: 9.2 mg/dL (ref 8.6–10.4)
Chloride: 108 mmol/L (ref 98–110)
Creat: 0.8 mg/dL (ref 0.50–1.03)
Globulin: 2.4 g/dL (calc) (ref 1.9–3.7)
Glucose, Bld: 89 mg/dL (ref 65–99)
Potassium: 3.9 mmol/L (ref 3.5–5.3)
Sodium: 142 mmol/L (ref 135–146)
Total Bilirubin: 0.4 mg/dL (ref 0.2–1.2)
Total Protein: 6.7 g/dL (ref 6.1–8.1)

## 2022-04-13 LAB — HEMOGLOBIN A1C
Hgb A1c MFr Bld: 5.8 % of total Hgb — ABNORMAL HIGH (ref <5.7)
Mean Plasma Glucose: 120 mg/dL
eAG (mmol/L): 6.6 mmol/L

## 2022-04-13 LAB — CBC
HCT: 42.3 % (ref 35.0–45.0)
Hemoglobin: 14.2 g/dL (ref 11.7–15.5)
MCH: 32 pg (ref 27.0–33.0)
MCHC: 33.6 g/dL (ref 32.0–36.0)
MCV: 95.3 fL (ref 80.0–100.0)
MPV: 10.2 fL (ref 7.5–12.5)
Platelets: 249 10*3/uL (ref 140–400)
RBC: 4.44 10*6/uL (ref 3.80–5.10)
RDW: 12.6 % (ref 11.0–15.0)
WBC: 6.2 10*3/uL (ref 3.8–10.8)

## 2022-04-13 LAB — T4, FREE: Free T4: 1.1 ng/dL (ref 0.8–1.8)

## 2022-04-18 DIAGNOSIS — Z6830 Body mass index (BMI) 30.0-30.9, adult: Secondary | ICD-10-CM | POA: Insufficient documentation

## 2022-04-19 ENCOUNTER — Telehealth: Payer: Self-pay | Admitting: Family Medicine

## 2022-04-19 LAB — TSH: TSH: 1.42 mIU/L (ref 0.40–4.50)

## 2022-04-19 LAB — THYROID PEROXIDASE ANTIBODY: Thyroperoxidase Ab SerPl-aCnc: 123 IU/mL — ABNORMAL HIGH (ref <9)

## 2022-04-19 LAB — VITAMIN D 25 HYDROXY (VIT D DEFICIENCY, FRACTURES): Vit D, 25-Hydroxy: 65 ng/mL (ref 30–100)

## 2022-04-19 MED ORDER — LEVOTHYROXINE SODIUM 88 MCG PO TABS: ORAL_TABLET | ORAL | 3 refills | Status: DC

## 2022-04-19 NOTE — Telephone Encounter (Signed)
Spoke with pt regarding labs and instructions.   

## 2022-04-19 NOTE — Telephone Encounter (Signed)
Please inform patient her thyroid/TSH level finally has been resulted. Her TSH is in normal range, but her thyroid antibodies are significantly high.  When thyroid antibodies are elevated this can make people more tired and have thyroid symptoms despite thyroid being in normal range. I believe she would benefit from [place] attempting to get her TSH levels a little bit more suppressed by increasing her levothyroxine dose.  I have called in a new dose of levothyroxine 88 mcg.  She will take 1 tab daily except for on Sunday take a half a tab.   We discussed during her visit her desire for weight loss counseling.  If she is still interested in this we can bring her back for weight loss counseling as we discussed at her earliest convenience (if she has not made an appointment already).

## 2022-04-24 ENCOUNTER — Telehealth: Payer: Self-pay | Admitting: Family Medicine

## 2022-04-24 DIAGNOSIS — R310 Gross hematuria: Secondary | ICD-10-CM

## 2022-04-24 NOTE — Telephone Encounter (Signed)
Okay to place referral for insurance purposes for her.

## 2022-04-24 NOTE — Telephone Encounter (Signed)
Patient is requesting referral to [facility] for blood in urine. Please advise

## 2022-04-24 NOTE — Telephone Encounter (Signed)
Please advise on dx.

## 2022-04-24 NOTE — Telephone Encounter (Signed)
Pt already a pt. Pt states that ins requires new referral Referral pending. Please advise on dx.

## 2022-04-25 DIAGNOSIS — B9620 Unspecified Escherichia coli [E. coli] as the cause of diseases classified elsewhere: Secondary | ICD-10-CM | POA: Diagnosis not present

## 2022-04-25 DIAGNOSIS — N390 Urinary tract infection, site not specified: Secondary | ICD-10-CM | POA: Diagnosis not present

## 2022-04-25 DIAGNOSIS — R310 Gross hematuria: Secondary | ICD-10-CM | POA: Diagnosis not present

## 2022-04-25 DIAGNOSIS — M5127 Other intervertebral disc displacement, lumbosacral region: Secondary | ICD-10-CM | POA: Diagnosis not present

## 2022-04-25 DIAGNOSIS — Z87442 Personal history of urinary calculi: Secondary | ICD-10-CM | POA: Diagnosis not present

## 2022-04-25 DIAGNOSIS — R1084 Generalized abdominal pain: Secondary | ICD-10-CM | POA: Diagnosis not present

## 2022-04-25 DIAGNOSIS — R109 Unspecified abdominal pain: Secondary | ICD-10-CM | POA: Diagnosis not present

## 2022-04-25 DIAGNOSIS — N3000 Acute cystitis without hematuria: Secondary | ICD-10-CM | POA: Diagnosis not present

## 2022-04-25 NOTE — Telephone Encounter (Signed)
Referral placed.

## 2022-05-03 ENCOUNTER — Ambulatory Visit: Payer: Self-pay | Admitting: Nurse Practitioner

## 2022-05-06 ENCOUNTER — Other Ambulatory Visit: Payer: Self-pay | Admitting: Internal Medicine

## 2022-05-06 ENCOUNTER — Other Ambulatory Visit: Payer: Self-pay | Admitting: Family Medicine

## 2022-05-06 DIAGNOSIS — R0609 Other forms of dyspnea: Secondary | ICD-10-CM

## 2022-06-14 ENCOUNTER — Telehealth: Payer: Self-pay | Admitting: Licensed Clinical Social Worker

## 2022-06-14 ENCOUNTER — Ambulatory Visit: Payer: Self-pay | Admitting: Nurse Practitioner

## 2022-06-14 NOTE — Telephone Encounter (Signed)
[facility] Navigation CSW Progress Note  LCSW following for pt appt w/ [facility] today. Note it was cancelled for today. Unclear if pt has current coverage. If pt should reschedule appt can meet with our team for further needed assistance.   Westley Hummer, MSW, [place]  [phone]- work cell phone (preferred) [phone]- desk phone

## 2022-06-17 ENCOUNTER — Ambulatory Visit: Payer: 59

## 2022-06-17 ENCOUNTER — Encounter: Payer: Self-pay | Admitting: Family Medicine

## 2022-06-17 ENCOUNTER — Telehealth: Payer: Self-pay | Admitting: Family Medicine

## 2022-06-17 ENCOUNTER — Telehealth (INDEPENDENT_AMBULATORY_CARE_PROVIDER_SITE_OTHER): Payer: 59 | Admitting: Family Medicine

## 2022-06-17 DIAGNOSIS — R051 Acute cough: Secondary | ICD-10-CM

## 2022-06-17 DIAGNOSIS — R519 Headache, unspecified: Secondary | ICD-10-CM

## 2022-06-17 DIAGNOSIS — R0982 Postnasal drip: Secondary | ICD-10-CM | POA: Diagnosis not present

## 2022-06-17 DIAGNOSIS — B349 Viral infection, unspecified: Secondary | ICD-10-CM | POA: Diagnosis not present

## 2022-06-17 LAB — POCT INFLUENZA A/B
Influenza A, POC: NEGATIVE
Influenza B, POC: NEGATIVE

## 2022-06-17 LAB — POC COVID19 BINAXNOW: SARS Coronavirus 2 Ag: NEGATIVE

## 2022-06-17 MED ORDER — IPRATROPIUM BROMIDE 0.03 % NA SOLN
2.0000 | Freq: Four times a day (QID) | NASAL | 0 refills | Status: DC
Start: 2022-06-17 — End: 2022-10-22

## 2022-06-17 MED ORDER — DOXYCYCLINE HYCLATE 100 MG PO TABS: 100.0000 mg | ORAL_TABLET | Freq: Two times a day (BID) | ORAL | 0 refills | Status: DC

## 2022-06-17 NOTE — Telephone Encounter (Signed)
Please inform patient both her COVID and influenza tests are negative. Rest, hydrate, Mucinex products can be helpful to decrease phlegm production. I called in the Atrovent nasal spray which is helpful to decrease the postnasal drip for her.  I also called in doxycycline to start in around 2-3 days if symptoms do not continue to improve, sooner if worsening.  Should be able to return to work tomorrow.

## 2022-06-17 NOTE — Progress Notes (Signed)
VIRTUAL VISIT VIA VIDEO  I connected with [name] on 06/17/22 at 11:20 AM EDT by a video enabled telemedicine application and verified that I am speaking with the correct person using two identifiers. Location patient: Home Location provider: [facility], Office Persons participating in the virtual visit: Patient, Dr. Raoul Pitch and Cyndra Numbers, CMA  I discussed the limitations of evaluation and management by telemedicine and the availability of in person appointments. The patient expressed understanding and agreed to proceed.     Mary Escobar , 1966/04/09, 56 y.o., female MRN: [id number] Patient Care Team    Relationship Specialty Notifications Start End  Ma Hillock, DO PCP - General Family Medicine  04/01/22   Alexis Frock, MD Consulting Physician Urology  06/12/20   [facility]    06/12/20   Kerry Dory, NP Nurse Practitioner Nurse Practitioner  06/12/20    Comment: [facility]  Lelon Perla, MD Consulting Physician Cardiology  04/01/22     Chief Complaint  Patient presents with   Nasal Congestion    Pt c/o low grade temp, sinus pressure, dry cough, post nasal drip, HA, fatigue, and congestion x 5 days; pt has not tested for covid;      Subjective: Pt presents for an OV with complaints of nasal congestion, low-grade temp, sinus pressure, dry cough, postnasal drip, headache and fatigue that started about 5 days ago.  She has not tested for COVID. She reports her family has been ill, but she also works with patients.  She missed Thursday, Friday and today from work secondary to illness.    04/12/2022    3:11 PM 07/06/2021    2:09 PM 02/08/2021    8:30 AM 11/20/2020   11:22 AM 06/23/2020    2:35 PM  Depression screen PHQ 2/9  Decreased Interest 0 0 0 0 0  Down, Depressed, Hopeless 0 0 0 0 2  PHQ - 2 Score 0 0 0 0 2  Altered sleeping 3 1  0 3  Tired, decreased energy 3 3  0 3  Change in appetite 0 1  0 1  Feeling  bad or failure about yourself  0 0  0 0  Trouble concentrating 0 2  0 0  Moving slowly or fidgety/restless 0 0  0 0  Suicidal thoughts 0 0  0 0  PHQ-9 Score 6 7  0 9    Allergies  Allergen Reactions   Citalopram Palpitations   Ciprofloxacin Nausea And Vomiting   Effexor [Venlafaxine]     nausea   Social History   Social History Narrative   Marital status/children/pets: Single, 1 child.    education/employment: 12th grade education, employed as a Biomedical engineer.   Safety:      -smoke alarm in the home:Yes     - wears seatbelt: Yes     - Feels safe in their relationships: Yes   Past Medical History:  Diagnosis Date   Allergy    Anxiety    Arthritis    Chronic constipation 01/11/2020   Chronic kidney disease    kidney stones   COPD (chronic obstructive pulmonary disease) (HCC)    DOE (dyspnea on exertion) 01/11/2020   Fainting spell    Family history of premature CAD    Fatigue 2020   multifactoral   GERD (gastroesophageal reflux disease)    History of kidney stones    Hot flashes 02/11/2020   Hyperlipidemia  Hypertension    Hypothyroid    IBS (irritable bowel syndrome)    Kidney stone 08/18/2019   mild nonobstructive CAD on cath 2009    Osteoporosis    Other complicated headache syndrome 12/21/2020   Palpitation    Polyarthralgia 12/08/2018   Smoker    Snoring 09/08/2018   Vitamin D deficiency    Past Surgical History:  Procedure Laterality Date   CORONARY ANGIOGRAM  2009   mild CAD noted after abn GXT   CYSTOSCOPY W/ URETERAL STENT PLACEMENT Right 03/31/2020   Procedure: CYSTOSCOPY WITH STENT PLACEMENT;  Surgeon: Alexis Frock, MD;  Location: [facility];  Service: Urology;  Laterality: Right;   CYSTOSCOPY WITH RETROGRADE PYELOGRAM, URETEROSCOPY AND STENT PLACEMENT Right 04/21/2020   Procedure: CYSTOSCOPY WITH RETROGRADE PYELOGRAM, RIGHT URETEROSCOPY;  Surgeon: Alexis Frock, MD;  Location: [facility];  Service: Urology;  Laterality: Right;  1 HR   HOLMIUM LASER  APPLICATION Right 0/16/0109   Procedure: HOLMIUM LASER APPLICATION;  Surgeon: Alexis Frock, MD;  Location: [facility];  Service: Urology;  Laterality: Right;   WRIST SURGERY     Family History  Problem Relation Age of Onset   Diabetes Mother    Other Mother        carcinoid tumor   Hyperlipidemia Mother    Arthritis Mother    Hypertension Mother    Colon cancer Mother    CAD Father        MI at age 37   Heart disease Father        CABG x 3   Sleep apnea Father    Arrhythmia Father        pacemaker   COPD Father    Diabetes Father    Thyroid disease Father    Hyperlipidemia Father    Arthritis Father    Hypertension Father    Heart attack Father    Colon polyps Father    Crohn's disease Sister    Arthritis Maternal Grandmother    Diabetes Maternal Grandmother    Heart attack Maternal Grandmother    Heart disease Maternal Grandmother    Arthritis Maternal Grandfather    Heart attack Maternal Grandfather 33       died 60 of MI   Arthritis Paternal Grandmother    Stroke Paternal Grandmother    Arthritis Paternal Grandfather    Heart attack Paternal Grandfather 36       Died of MI   Pulmonary fibrosis Paternal Grandfather    Healthy Daughter    Colon cancer Maternal Aunt    Pulmonary fibrosis Paternal Uncle    Pulmonary fibrosis Paternal Aunt    Esophageal cancer Neg Hx    Stomach cancer Neg Hx    Rectal cancer Neg Hx    Allergies as of 06/17/2022       Reactions   Citalopram Palpitations   Ciprofloxacin Nausea And Vomiting   Effexor [venlafaxine]    nausea        Medication List        Accurate as of June 17, 2022  2:35 PM. If you have any questions, ask your nurse or doctor.          STOP taking these medications    cimetidine 300 MG tablet Commonly known as: TAGAMET Stopped by: Howard Pouch, DO   MAGNESIUM PO Stopped by: Howard Pouch, DO       TAKE these medications    albuterol 108 (90 Base) MCG/ACT inhaler Commonly known as:  VENTOLIN HFA Inhale  2 puffs into the lungs every 6 (six) hours as needed for wheezing or shortness of breath.   aspirin EC 81 MG tablet Take 81 mg by mouth daily. Swallow whole.   buPROPion 300 MG 24 hr tablet Commonly known as: WELLBUTRIN XL Take 1 tablet (300 mg total) by mouth daily.   celecoxib 200 MG capsule Commonly known as: CeleBREX Take 1 capsule (200 mg total) by mouth 2 (two) times daily.   cyanocobalamin 1000 MCG tablet Take 1,000 mcg by mouth daily.   D3-1000 PO Take by mouth.   escitalopram 20 MG tablet Commonly known as: LEXAPRO Take 1 tablet (20 mg total) by mouth daily.   famotidine 20 MG tablet Commonly known as: Pepcid One after supper   FIBER ADULT GUMMIES PO Take 2 each by mouth daily.   fluticasone 50 MCG/ACT nasal spray Commonly known as: FLONASE Place 2 sprays into both nostrils in the morning and at bedtime.   ipratropium 0.03 % nasal spray Commonly known as: ATROVENT Place 2 sprays into both nostrils 4 (four) times daily. Started by: Howard Pouch, DO   levocetirizine 5 MG tablet Commonly known as: XYZAL Take 1 tablet (5 mg total) by mouth every evening.   levothyroxine 88 MCG tablet Commonly known as: Synthroid 1 tab p.o. 6 days a week and a half a tab on Sunday.   metoprolol succinate 25 MG 24 hr tablet Commonly known as: TOPROL-XL Take 0.5-1 tablets (12.5-25 mg total) by mouth daily.   nitroGLYCERIN 0.4 MG SL tablet Commonly known as: NITROSTAT Place 1 tablet (0.4 mg total) under the tongue every 5 (five) minutes as needed for chest pain.   ondansetron 4 MG tablet Commonly known as: Zofran Take 1 tablet (4 mg total) by mouth every 8 (eight) hours as needed.   pantoprazole 40 MG tablet Commonly known as: PROTONIX TAKE 1 TABLET(40 MG) BY MOUTH DAILY 30 TO 60 MINUTES BEFORE FIRST MEAL OF THE DAY   rosuvastatin 40 MG tablet Commonly known as: CRESTOR Take 1 tablet (40 mg total) by mouth daily.   Vitamin D (Ergocalciferol)  1.25 MG (50000 UNIT) Caps capsule Commonly known as: DRISDOL Take 50,000 Units by mouth once a week.        All past medical history, surgical history, allergies, family history, immunizations andmedications were updated in the EMR today and reviewed under the history and medication portions of their EMR.     Review of Systems  Constitutional:  Positive for fever and malaise/fatigue.  HENT:  Positive for congestion and sinus pain.   Respiratory:  Positive for cough. Negative for shortness of breath.   Gastrointestinal:  Negative for abdominal pain, nausea and vomiting.  Skin:  Negative for rash.  Neurological:  Positive for headaches. Negative for dizziness.   Negative, with the exception of above mentioned in HPI   Objective:  LMP  (LMP Unknown)  There is no height or weight on file to calculate BMI.  Physical Exam Vitals and nursing note reviewed.  Constitutional:      General: She is not in acute distress.    Appearance: Normal appearance. She is normal weight. She is not ill-appearing or toxic-appearing.  Eyes:     Extraocular Movements: Extraocular movements intact.     Conjunctiva/sclera: Conjunctivae normal.     Pupils: Pupils are equal, round, and reactive to light.  Pulmonary:     Effort: Pulmonary effort is normal.     Breath sounds: Normal breath sounds.  Neurological:  Mental Status: She is alert and oriented to person, place, and time. Mental status is at baseline.  Psychiatric:        Mood and Affect: Mood normal.        Behavior: Behavior normal.        Thought Content: Thought content normal.        Judgment: Judgment normal.      No results found. No results found. Results for orders placed or performed in visit on 06/17/22 (from the past 24 hour(s))  POCT Influenza A/B     Status: None   Collection Time: 06/17/22  1:28 PM  Result Value Ref Range   Influenza A, POC Negative Negative   Influenza B, POC Negative Negative  POC COVID-19 BinaxNow      Status: None   Collection Time: 06/17/22  1:36 PM  Result Value Ref Range   SARS Coronavirus 2 Ag Negative Negative    Assessment/Plan: Eloise L Iracheta is a 56 y.o. female present for OV for  Acute cough/headache/postnasal drip/ Patient scheduled for nurse visit for swabbing to rule out COVID and pneumonia. - POC COVID-19 BinaxNow - POCT Influenza A/B Rest, hydrate.  +/- flonase, mucinex (DM if cough), nettie pot or nasal saline.  Atrovent nasal spray prescribed Doxycycline twice daily x7 days if symptoms progress or worsen within the next 3 days.  If symptoms continue to improve, do not start. Follow-up in 2 weeks if not improving sooner if worsening    Reviewed expectations re: course of current medical issues. Discussed self-management of symptoms. Outlined signs and symptoms indicating need for more acute intervention. Patient verbalized understanding and all questions were answered. Patient received an After-Visit Summary.    Orders Placed This Encounter  Procedures   POC COVID-19 BinaxNow   POCT Influenza A/B   Meds ordered this encounter  Medications   ipratropium (ATROVENT) 0.03 % nasal spray    Sig: Place 2 sprays into both nostrils 4 (four) times daily.    Dispense:  30 mL    Refill:  0   Referral Orders  No referral(s) requested today     Note is dictated utilizing voice recognition software. Although note has been proof read prior to signing, occasional typographical errors still can be missed. If any questions arise, please do not hesitate to call for verification.   electronically signed by:  Howard Pouch, DO  [place]

## 2022-06-17 NOTE — Telephone Encounter (Signed)
Spoke with patient regarding results/recommendations.  

## 2022-07-01 ENCOUNTER — Other Ambulatory Visit: Payer: Self-pay

## 2022-07-12 ENCOUNTER — Other Ambulatory Visit: Payer: Self-pay

## 2022-07-14 ENCOUNTER — Other Ambulatory Visit: Payer: Self-pay

## 2022-07-14 ENCOUNTER — Ambulatory Visit (INDEPENDENT_AMBULATORY_CARE_PROVIDER_SITE_OTHER): Payer: 59

## 2022-07-14 ENCOUNTER — Encounter (HOSPITAL_COMMUNITY): Payer: Self-pay | Admitting: Emergency Medicine

## 2022-07-14 ENCOUNTER — Ambulatory Visit (HOSPITAL_COMMUNITY)
Admission: EM | Admit: 2022-07-14 | Discharge: 2022-07-14 | Disposition: A | Discharge status: Home / Self Care | Payer: 59 | Attending: Emergency Medicine | Admitting: Emergency Medicine

## 2022-07-14 DIAGNOSIS — T1490XA Injury, unspecified, initial encounter: Secondary | ICD-10-CM

## 2022-07-14 DIAGNOSIS — M7989 Other specified soft tissue disorders: Secondary | ICD-10-CM | POA: Diagnosis not present

## 2022-07-14 DIAGNOSIS — M79672 Pain in left foot: Secondary | ICD-10-CM | POA: Diagnosis not present

## 2022-07-14 DIAGNOSIS — S99912A Unspecified injury of left ankle, initial encounter: Secondary | ICD-10-CM | POA: Diagnosis not present

## 2022-07-14 DIAGNOSIS — W19XXXA Unspecified fall, initial encounter: Secondary | ICD-10-CM

## 2022-07-14 DIAGNOSIS — S99922A Unspecified injury of left foot, initial encounter: Secondary | ICD-10-CM

## 2022-07-14 DIAGNOSIS — M25572 Pain in left ankle and joints of left foot: Secondary | ICD-10-CM

## 2022-07-14 NOTE — ED Provider Notes (Signed)
[place]    CSN: [id number] Arrival date & time: 07/14/22  1516      History   Chief Complaint Chief Complaint  Patient presents with   Foot Pain    HPI Mary Escobar is a 56 y.o. female.  Presents with left foot injury Stepped over a gate and fell Swelling and pain at left ankle, midfoot, and great toe. No medications PTA  History of left foot surgery, flat feet/collapsed arches  Past Medical History:  Diagnosis Date   Allergy    Anxiety    Arthritis    Chronic constipation 01/11/2020   Chronic kidney disease    kidney stones   COPD (chronic obstructive pulmonary disease) (HCC)    DOE (dyspnea on exertion) 01/11/2020   Fainting spell    Family history of premature CAD    Fatigue 2020   multifactoral   GERD (gastroesophageal reflux disease)    History of kidney stones    Hot flashes 02/11/2020   Hyperlipidemia    Hypertension    Hypothyroid    IBS (irritable bowel syndrome)    Kidney stone 08/18/2019   mild nonobstructive CAD on cath 2009    Osteoporosis    Other complicated headache syndrome 12/21/2020   Palpitation    Polyarthralgia 12/08/2018   Smoker    Snoring 09/08/2018   Vitamin D deficiency     Patient Active Problem List   Diagnosis Date Noted   BMI 30.0-30.9,adult 04/18/2022   Morbid obesity ([place]) 12/21/2020   Hypertension    Hyperlipidemia LDL goal <70    Depression, major, single episode, moderate ([place]) 01/11/2020   COPD (chronic obstructive pulmonary disease) ([place]) 12/08/2019   Coronary artery disease involving native coronary artery of native heart with angina pectoris ([place]) 12/08/2019   Irritable bowel syndrome 07/16/2019   Vitamin D deficiency 12/08/2018   Family history of premature CAD 09/08/2018   Cigarette smoker 11/21/2015   Allergic rhinitis 10/29/2007   History of palpitations 04/08/2007   Hypothyroidism 07/01/2006   Anxiety 07/01/2006    Past Surgical History:  Procedure Laterality Date   CORONARY  ANGIOGRAM  2009   mild CAD noted after abn GXT   CYSTOSCOPY W/ URETERAL STENT PLACEMENT Right 03/31/2020   Procedure: CYSTOSCOPY WITH STENT PLACEMENT;  Surgeon: Alexis Frock, MD;  Location: [facility];  Service: Urology;  Laterality: Right;   CYSTOSCOPY WITH RETROGRADE PYELOGRAM, URETEROSCOPY AND STENT PLACEMENT Right 04/21/2020   Procedure: CYSTOSCOPY WITH RETROGRADE PYELOGRAM, RIGHT URETEROSCOPY;  Surgeon: Alexis Frock, MD;  Location: [facility];  Service: Urology;  Laterality: Right;  1 HR   HOLMIUM LASER APPLICATION Right 5/91/6384   Procedure: HOLMIUM LASER APPLICATION;  Surgeon: Alexis Frock, MD;  Location: [facility];  Service: Urology;  Laterality: Right;   WRIST SURGERY      OB History     Gravida  1   Para  1   Term      Preterm      AB      Living  1      SAB      IAB      Ectopic      Multiple      Live Births               Home Medications    Prior to Admission medications   Medication Sig Start Date End Date Taking? Authorizing Provider  albuterol (VENTOLIN HFA) 108 (90 Base) MCG/ACT inhaler Inhale 2 puffs into the lungs every 6 (  six) hours as needed for wheezing or shortness of breath.    [provider]  aspirin EC 81 MG tablet Take 81 mg by mouth daily. Swallow whole.    [provider]  buPROPion (WELLBUTRIN XL) 300 MG 24 hr tablet Take 1 tablet (300 mg total) by mouth daily. 04/12/22   Kuneff, Renee A, DO  celecoxib (CELEBREX) 200 MG capsule Take 1 capsule (200 mg total) by mouth 2 (two) times daily. Patient not taking: Reported on 07/14/2022 11/22/21   Garrel Ridgel, DPM  Cholecalciferol (D3-1000 PO) Take by mouth.    [provider]  cyanocobalamin 1000 MCG tablet Take 1,000 mcg by mouth daily.    [provider]  doxycycline (VIBRA-TABS) 100 MG tablet Take 1 tablet (100 mg total) by mouth 2 (two) times daily. Patient not taking: Reported on 07/14/2022 06/17/22   Howard Pouch A, DO  escitalopram (LEXAPRO) 20 MG  tablet Take 1 tablet (20 mg total) by mouth daily. 04/12/22   Raoul Pitch, Renee A, DO  famotidine (PEPCID) 20 MG tablet One after supper 06/29/21   Tanda Rockers, MD  FIBER ADULT GUMMIES PO Take 2 each by mouth daily.    [provider]  fluticasone (FLONASE) 50 MCG/ACT nasal spray Place 2 sprays into both nostrils in the morning and at bedtime. 04/12/22   Kuneff, Renee A, DO  ipratropium (ATROVENT) 0.03 % nasal spray Place 2 sprays into both nostrils 4 (four) times daily. 06/17/22   Kuneff, Renee A, DO  levocetirizine (XYZAL) 5 MG tablet Take 1 tablet (5 mg total) by mouth every evening. Patient not taking: Reported on 07/14/2022 04/12/22   Howard Pouch A, DO  levothyroxine (SYNTHROID) 88 MCG tablet 1 tab p.o. 6 days a week and a half a tab on Sunday. 04/19/22   Kuneff, Renee A, DO  metoprolol succinate (TOPROL-XL) 25 MG 24 hr tablet Take 0.5-1 tablets (12.5-25 mg total) by mouth daily. 04/12/22   Kuneff, Renee A, DO  nitroGLYCERIN (NITROSTAT) 0.4 MG SL tablet Place 1 tablet (0.4 mg total) under the tongue every 5 (five) minutes as needed for chest pain. 12/08/19 06/29/21  Erlene Quan, PA-C  ondansetron (ZOFRAN) 4 MG tablet Take 1 tablet (4 mg total) by mouth every 8 (eight) hours as needed. 07/18/21   Hyatt, Max T, DPM  pantoprazole (PROTONIX) 40 MG tablet TAKE 1 TABLET(40 MG) BY MOUTH DAILY 30 TO 60 MINUTES BEFORE FIRST MEAL OF THE DAY 10/08/21   Tanda Rockers, MD  rosuvastatin (CRESTOR) 40 MG tablet Take 1 tablet (40 mg total) by mouth daily. 07/06/21   Kuneff, Renee A, DO  Vitamin D, Ergocalciferol, (DRISDOL) 1.25 MG (50000 UNIT) CAPS capsule Take 50,000 Units by mouth once a week. 01/18/22   [provider]    Family History Family History  Problem Relation Age of Onset   Diabetes Mother    Other Mother        carcinoid tumor   Hyperlipidemia Mother    Arthritis Mother    Hypertension Mother    Colon cancer Mother    CAD Father        MI at age 48   Heart disease Father         CABG x 3   Sleep apnea Father    Arrhythmia Father        pacemaker   COPD Father    Diabetes Father    Thyroid disease Father    Hyperlipidemia Father  Arthritis Father    Hypertension Father    Heart attack Father    Colon polyps Father    Crohn's disease Sister    Arthritis Maternal Grandmother    Diabetes Maternal Grandmother    Heart attack Maternal Grandmother    Heart disease Maternal Grandmother    Arthritis Maternal Grandfather    Heart attack Maternal Grandfather 27       died 14 of MI   Arthritis Paternal Grandmother    Stroke Paternal Grandmother    Arthritis Paternal Grandfather    Heart attack Paternal Grandfather 47       Died of MI   Pulmonary fibrosis Paternal Grandfather    Healthy Daughter    Colon cancer Maternal Aunt    Pulmonary fibrosis Paternal Uncle    Pulmonary fibrosis Paternal Aunt    Esophageal cancer Neg Hx    Stomach cancer Neg Hx    Rectal cancer Neg Hx     Social History Social History   Tobacco Use   Smoking status: Every Day    Packs/day: 2.00    Years: 41.00    Total pack years: 82.00    Types: Cigarettes   Smokeless tobacco: Never   Tobacco comments:    Currently 5-7 cigs per day 06/29/21  Vaping Use   Vaping Use: Never used  Substance Use Topics   Alcohol use: No    Alcohol/week: 0.0 standard drinks of alcohol   Drug use: No     Allergies   Citalopram, Ciprofloxacin, and Effexor [venlafaxine]   Review of Systems Review of Systems Per HPI  Physical Exam Triage Vital Signs ED Triage Vitals  Enc Vitals Group     BP 07/14/22 1553 105/65     Pulse Rate 07/14/22 1553 (!) 56     Resp 07/14/22 1553 18     Temp 07/14/22 1553 98.1 F (36.7 C)     Temp Source 07/14/22 1553 Oral     SpO2 07/14/22 1553 97 %     Weight --      Height --      Head Circumference --      Peak Flow --      Pain Score 07/14/22 1549 5     Pain Loc --      Pain Edu? --      Excl. in [place]? --    No data found.  Updated Vital  Signs BP 105/65 (BP Location: Right Arm) Comment (BP Location): large cuff  Pulse (!) 56   Temp 98.1 F (36.7 C) (Oral)   Resp 18   LMP  (LMP Unknown)   SpO2 97%   Physical Exam Vitals and nursing note reviewed.  Constitutional:      General: She is not in acute distress. HENT:     Mouth/Throat:     Pharynx: Oropharynx is clear.  Cardiovascular:     Rate and Rhythm: Normal rate and regular rhythm.     Pulses: Normal pulses.  Pulmonary:     Effort: Pulmonary effort is normal.  Musculoskeletal:        General: Swelling and tenderness present.       Feet:     Comments: Pain with palpation at noted areas. Dec ROM. Swelling at midfoot/great toe. Sensation intact with cap refill < 2 seconds. DP pulse 2+  Skin:    General: Skin is warm and dry.     Capillary Refill: Capillary refill takes less than 2 seconds.     Findings:  No bruising or erythema.  Neurological:     Mental Status: She is alert and oriented to person, place, and time.     Gait: Gait abnormal.      UC Treatments / Results  Labs (all labs ordered are listed, but only abnormal results are displayed) Labs Reviewed - No data to display  EKG  Radiology DG Foot Complete Left  Result Date: 07/14/2022 CLINICAL DATA:  injury today, pain and swelling; pain swelling injury. Patient fell stepping over a "doggy gate" today. Pain to left foot, visible swelling. Left radial pulse 2 +. Minimal movement of toes. Patient has had surgery on left ankle EXAM: LEFT FOOT - COMPLETE 3+ VIEW; LEFT ANKLE COMPLETE - 3+ VIEW COMPARISON:  X-ray left foot 01/28/2022. x-ray left foot 04/02/2022 FINDINGS: Left foot: Pes planus. There is no evidence of fracture or dislocation. Tendon repair anchor suture. Mild degenerative changes of the first digit metatarsophalangeal joint. Soft tissues are unremarkable. Left ankle: No evidence of fracture, dislocation, or joint effusion. No evidence of severe arthropathy. No aggressive appearing focal bone  abnormality. Soft tissues are unremarkable. IMPRESSION: No acute displaced fracture or dislocation of the bones of the left foot and ankle. Electronically Signed   By: Iven Finn M.D.   On: 07/14/2022 16:25   DG Ankle Complete Left  Result Date: 07/14/2022 CLINICAL DATA:  injury today, pain and swelling; pain swelling injury. Patient fell stepping over a "doggy gate" today. Pain to left foot, visible swelling. Left radial pulse 2 +. Minimal movement of toes. Patient has had surgery on left ankle EXAM: LEFT FOOT - COMPLETE 3+ VIEW; LEFT ANKLE COMPLETE - 3+ VIEW COMPARISON:  X-ray left foot 01/28/2022. x-ray left foot 04/02/2022 FINDINGS: Left foot: Pes planus. There is no evidence of fracture or dislocation. Tendon repair anchor suture. Mild degenerative changes of the first digit metatarsophalangeal joint. Soft tissues are unremarkable. Left ankle: No evidence of fracture, dislocation, or joint effusion. No evidence of severe arthropathy. No aggressive appearing focal bone abnormality. Soft tissues are unremarkable. IMPRESSION: No acute displaced fracture or dislocation of the bones of the left foot and ankle. Electronically Signed   By: Iven Finn M.D.   On: 07/14/2022 16:25    Procedures Procedures   Medications Ordered in UC Medications - No data to display  Initial Impression / Assessment and Plan / UC Course  I have reviewed the triage vital signs and the nursing notes.  Pertinent labs & imaging results that were available during my care of the patient were reviewed by me and considered in my medical decision making (see chart for details).  Declined pain medication in clinic  Foot and ankle x ray are negative for acute osseous injury. Recommend RICE therapy with ibu or tylenol Ankle brace provided Ortho follow up as needed Patient agrees to plan   Final Clinical Impressions(s) / UC Diagnoses   Final diagnoses:  Injury of left ankle, initial encounter  Foot injury, left,  initial encounter  Soft tissue injury     Discharge Instructions      Rest - try to avoid heavy lifting and high impact activity Ice - apply for 20 minutes a few times daily Compression - use ankle sleeve as needed when walking/standing Elevation - prop up on a pillow  Ibuprofen, up to 800 mg every 6 hours as needed. Or tylenol  Follow up with orthopedics as needed.      ED Prescriptions   None    PDMP not reviewed this  encounter.   [place], [place] 07/14/22 1654

## 2022-07-14 NOTE — Discharge Instructions (Signed)
Rest - try to avoid heavy lifting and high impact activity Ice - apply for 20 minutes a few times daily Compression - use ankle sleeve as needed when walking/standing Elevation - prop up on a pillow  Ibuprofen, up to 800 mg every 6 hours as needed. Or tylenol  Follow up with orthopedics as needed.

## 2022-07-14 NOTE — ED Triage Notes (Addendum)
Patient fell stepping over a "doggy gate" today.  Pain to left foot, visible swelling.  Left radial pulse 2 +.  Minimal movement of toes.   Patient has had surgery on left ankle

## 2022-07-17 ENCOUNTER — Telehealth: Payer: Self-pay | Admitting: *Deleted

## 2022-07-17 NOTE — Telephone Encounter (Signed)
Patient fell on Sunday, went to ER '@[facility] , they took her out of work for 2 days,left foot is still swollen, nothing broken but top of foot is still painful,was told to take up to 800 mg of ibuprofen. She will contact Ortho since the hospital has suggested that she f/u with them, will call back if she decides to schedule here.  She wanted to let the doctor know about the incident.

## 2022-07-25 DIAGNOSIS — S9032XA Contusion of left foot, initial encounter: Secondary | ICD-10-CM | POA: Diagnosis not present

## 2022-07-25 DIAGNOSIS — M25572 Pain in left ankle and joints of left foot: Secondary | ICD-10-CM | POA: Diagnosis not present

## 2022-08-09 ENCOUNTER — Other Ambulatory Visit: Payer: Self-pay

## 2022-08-09 MED ORDER — ROSUVASTATIN CALCIUM 40 MG PO TABS: 40.0000 mg | ORAL_TABLET | Freq: Every day | ORAL | 1 refills | Status: DC

## 2022-10-18 ENCOUNTER — Telehealth: Payer: Self-pay

## 2022-10-18 NOTE — Telephone Encounter (Signed)
Prior authorization approved for Rosuvastain 40 mg from 10/17/2022 to 10/18/23.  Approval number [id number]

## 2022-10-18 NOTE — Telephone Encounter (Signed)
Noted  

## 2022-10-22 ENCOUNTER — Encounter: Payer: Self-pay | Admitting: Family Medicine

## 2022-10-22 ENCOUNTER — Ambulatory Visit: Payer: 59 | Admitting: Family Medicine

## 2022-10-22 DIAGNOSIS — R0609 Other forms of dyspnea: Secondary | ICD-10-CM

## 2022-10-22 MED ORDER — BUPROPION HCL ER (XL) 300 MG PO TB24: 300.0000 mg | ORAL_TABLET | Freq: Every day | ORAL | 1 refills | Status: DC

## 2022-10-22 MED ORDER — METOPROLOL SUCCINATE ER 25 MG PO TB24: 12.5000 mg | ORAL_TABLET | Freq: Every day | ORAL | 1 refills | Status: DC

## 2022-10-22 MED ORDER — LEVOTHYROXINE SODIUM 88 MCG PO TABS: ORAL_TABLET | ORAL | 1 refills | Status: DC

## 2022-10-22 MED ORDER — ROSUVASTATIN CALCIUM 40 MG PO TABS: 40.0000 mg | ORAL_TABLET | Freq: Every day | ORAL | 1 refills | Status: DC

## 2022-10-22 MED ORDER — METHYLPREDNISOLONE ACETATE 80 MG/ML IJ SUSP: 80.0000 mg | Freq: Once | INTRAMUSCULAR | Status: DC

## 2022-10-22 MED ORDER — ESCITALOPRAM OXALATE 20 MG PO TABS: 20.0000 mg | ORAL_TABLET | Freq: Every day | ORAL | 1 refills | Status: DC

## 2022-10-22 MED ORDER — TRAZODONE HCL 50 MG PO TABS: 25.0000 mg | ORAL_TABLET | Freq: Every evening | ORAL | 1 refills | Status: DC | PRN

## 2022-10-22 MED ORDER — FLUTICASONE PROPIONATE 50 MCG/ACT NA SUSP: 2.0000 | Freq: Two times a day (BID) | NASAL | 6 refills | Status: AC

## 2022-10-22 NOTE — Progress Notes (Unsigned)
Patient ID: Mary Escobar, female  DOB: March 01, 1966, 57 y.o.   MRN: [id number] Patient Care Team    Relationship Specialty Notifications Start End  Ma Hillock, DO PCP - General Family Medicine  04/01/22   Alexis Frock, MD Consulting Physician Urology  06/12/20   [facility]    06/12/20   Kerry Dory, NP Nurse Practitioner Nurse Practitioner  06/12/20    Comment: [facility]  Lelon Perla, MD Consulting Physician Cardiology  04/01/22     Chief Complaint  Patient presents with   Hypothyroidism    Subjective: Mary Escobar is a 57 y.o.  Female  present for Chronic Conditions/illness Management All past medical history, surgical history, allergies, family history, immunizations, medications and social history were updated in the electronic medical record today. All recent labs, ED visits and hospitalizations within the last year were reviewed.   Hypertension/HLD-LDL goal less than 70/palpitations/CAD/family history of premature CAD She has a significant medical history of nonobstructive CAD, coronary CTA 01/29/20 showed mild (25-49%) calcified plaque in the LAD, she was sent for [facility] which showed no hemodynamically significant stenosis.  Her calcium score was 156 placing her in the 96 percentile.  She has had a prior history of cardiac catheterization in 2009 after a abnormal stress treadmill test which revealed nonobstructive CAD-20% ostial LAD stenosis and 30% left circumferential disease. Pt reports compliance with metoprolol 12.5-25 mg daily.   Patient denies chest pain, shortness of breath, dizziness or lower extremity edema.  Pt is compliant with daily baby ASA. Pt is compliant with Crestor RF: Hypertension, CAD, family history of premature CAD, smoker, hyperlipidemia   Anxiety/hot flashes Patient reports compliance with Wellbutrin 300 mg daily and lexpro 20 mg qd. Sx are not as well-controlled.  She does not feel the Wellbutrin is working  well for her anymore.  She had been temporarily tried on Effexor but unfortunately had side effects to medication and return back to using Wellbutrin.  She has also tried Celexa in the past with side effects.  She does report today she is not sleeping well at all waking up every couple hours.  She is finding herself more irritable throughout the day. Pulmonary emphysema, unspecified emphysema type (HCC)/nicotine dependence Patient reports she still is having feelings of shortness of breath, but feels it may have improved. She is now established with pulm. That started her on gerd therapy and did not feel she had copd by exam. Trelegy stopped and she has been ok. She states she has been on that inhaler for years from prior pcp. .  Cardiac causes have been ruled out.   Prior note: She reports history of COPD and use of Trelegy Ellipta.  She states her COPD been controlled on this medication but she has noticed an increase in her shortness of breath. She is prescribed albuterol and rarely uses.  She is still smoking at this time however she has cut back and is using Wellbutrin and her smoking cessation.   Echo 01/26/2021: IMPRESSIONS   1. Left ventricular ejection fraction, by estimation, is 60 to 65%. The  left ventricle has normal function. The left ventricle has no regional  wall motion abnormalities. Left ventricular diastolic parameters were  normal. The average left ventricular  global longitudinal strain is -24.9 %. The global longitudinal strain is  normal.   2. Right ventricular systolic function is normal. The right ventricular  size is normal. There is normal pulmonary artery systolic pressure.  The  estimated right ventricular systolic pressure is 29.5 mmHg.   3. The mitral valve is grossly normal. Trivial mitral valve  regurgitation.   4. The aortic valve is tricuspid. Aortic valve regurgitation is not  visualized.   5. The inferior vena cava is normal in size with greater than 50%   respiratory variability, suggesting right atrial pressure of 3 mmHg.   Neck soft tissue US 01/04/2021: IMPRESSION: Right submandibular palpable abnormality correlates with benign appearing cervical lymph node.   US Carotid 01/04/2021: IMPRESSION: Minor carotid atherosclerosis. No hemodynamically significant ICA stenosis. Degree of narrowing less than 50% bilaterally by ultrasound criteria.   Patent antegrade vertebral flow bilaterally      10/22/2022    3:08 PM 04/12/2022    3:11 PM 07/06/2021    2:09 PM 02/08/2021    8:30 AM 11/20/2020   11:22 AM  Depression screen PHQ 2/9  Decreased Interest 0 0 0 0 0  Down, Depressed, Hopeless 0 0 0 0 0  PHQ - 2 Score 0 0 0 0 0  Altered sleeping  3 1  0  Tired, decreased energy  3 3  0  Change in appetite  0 1  0  Feeling bad or failure about yourself   0 0  0  Trouble concentrating  0 2  0  Moving slowly or fidgety/restless  0 0  0  Suicidal thoughts  0 0  0  PHQ-9 Score  6 7  0      04/12/2022    3:11 PM 07/06/2021    2:09 PM 06/23/2020    2:36 PM  GAD 7 : Generalized Anxiety Score  Nervous, Anxious, on Edge '2 3 2  '$ Control/stop worrying '2 2 2  '$ Worry too much - different things '2 2 2  '$ Trouble relaxing '2 2 2  '$ Restless 0 0 0  Easily annoyed or irritable '1 2 3  '$ Afraid - awful might happen 0 0 0  Total GAD 7 Score '9 11 11    '$ Immunization History  Administered Date(s) Administered   Influenza Whole 08/12/2006, 08/28/2007   Influenza,inj,Quad PF,6+ Mos 06/17/2018, 05/26/2019, 07/06/2021, 06/24/2022   Influenza-Unspecified 08/25/2012, 07/15/2013   Tdap 06/09/2020    Past Medical History:  Diagnosis Date   Allergy    Anxiety    Arthritis    Chronic constipation 01/11/2020   Chronic kidney disease    kidney stones   COPD (chronic obstructive pulmonary disease) ([place])    DOE (dyspnea on exertion) 01/11/2020   Fainting spell    Family history of premature CAD    Fatigue 2020   multifactoral   GERD (gastroesophageal reflux  disease)    History of kidney stones    Hot flashes 02/11/2020   Hyperlipidemia    Hypertension    Hypothyroid    IBS (irritable bowel syndrome)    Kidney stone 08/18/2019   mild nonobstructive CAD on cath 2009    Osteoporosis    Other complicated headache syndrome 12/21/2020   Palpitation    Polyarthralgia 12/08/2018   Smoker    Snoring 09/08/2018   Vitamin D deficiency    Allergies  Allergen Reactions   Citalopram Palpitations   Ciprofloxacin Nausea And Vomiting   Effexor [Venlafaxine]     nausea   Past Surgical History:  Procedure Laterality Date   CORONARY ANGIOGRAM  2009   mild CAD noted after abn GXT   CYSTOSCOPY W/ URETERAL STENT PLACEMENT Right 03/31/2020   Procedure: CYSTOSCOPY WITH STENT PLACEMENT;  Surgeon: Alexis Frock, MD;  Location: [facility];  Service: Urology;  Laterality: Right;   CYSTOSCOPY WITH RETROGRADE PYELOGRAM, URETEROSCOPY AND STENT PLACEMENT Right 04/21/2020   Procedure: CYSTOSCOPY WITH RETROGRADE PYELOGRAM, RIGHT URETEROSCOPY;  Surgeon: Alexis Frock, MD;  Location: [facility];  Service: Urology;  Laterality: Right;  1 HR   HOLMIUM LASER APPLICATION Right 9/51/8841   Procedure: HOLMIUM LASER APPLICATION;  Surgeon: Alexis Frock, MD;  Location: [facility];  Service: Urology;  Laterality: Right;   WRIST SURGERY     Family History  Problem Relation Age of Onset   Diabetes Mother    Other Mother        carcinoid tumor   Hyperlipidemia Mother    Arthritis Mother    Hypertension Mother    Colon cancer Mother    CAD Father        MI at age 56   Heart disease Father        CABG x 3   Sleep apnea Father    Arrhythmia Father        pacemaker   COPD Father    Diabetes Father    Thyroid disease Father    Hyperlipidemia Father    Arthritis Father    Hypertension Father    Heart attack Father    Colon polyps Father    Crohn's disease Sister    Arthritis Maternal Grandmother    Diabetes Maternal Grandmother    Heart attack Maternal Grandmother     Heart disease Maternal Grandmother    Arthritis Maternal Grandfather    Heart attack Maternal Grandfather 40       died 73 of MI   Arthritis Paternal Grandmother    Stroke Paternal Grandmother    Arthritis Paternal Grandfather    Heart attack Paternal Grandfather 12       Died of MI   Pulmonary fibrosis Paternal Grandfather    Healthy Daughter    Colon cancer Maternal Aunt    Pulmonary fibrosis Paternal Uncle    Pulmonary fibrosis Paternal Aunt    Esophageal cancer Neg Hx    Stomach cancer Neg Hx    Rectal cancer Neg Hx    Social History   Social History Narrative   Marital status/children/pets: Single, 1 child.    education/employment: 12th grade education, employed as a Biomedical engineer.   Safety:      -smoke alarm in the home:Yes     - wears seatbelt: Yes     - Feels safe in their relationships: Yes    Allergies as of 10/22/2022       Reactions   Citalopram Palpitations   Ciprofloxacin Nausea And Vomiting   Effexor [venlafaxine]    nausea        Medication List        Accurate as of October 22, 2022 11:59 PM. If you have any questions, ask your nurse or doctor.          STOP taking these medications    buPROPion 300 MG 24 hr tablet Commonly known as: WELLBUTRIN XL Stopped by: Howard Pouch, DO   celecoxib 200 MG capsule Commonly known as: CeleBREX Stopped by: Howard Pouch, DO   cyanocobalamin 1000 MCG tablet Stopped by: Howard Pouch, DO   doxycycline 100 MG tablet Commonly known as: VIBRA-TABS Stopped by: Howard Pouch, DO   FIBER ADULT GUMMIES PO Stopped by: Howard Pouch, DO   ipratropium 0.03 % nasal spray Commonly known as: ATROVENT Stopped by: Howard Pouch, DO  ondansetron 4 MG tablet Commonly known as: Zofran Stopped by: Howard Pouch, DO   Vitamin D (Ergocalciferol) 1.25 MG (50000 UNIT) Caps capsule Commonly known as: DRISDOL Stopped by: Howard Pouch, DO       TAKE these medications    albuterol 108 (90 Base) MCG/ACT  inhaler Commonly known as: VENTOLIN HFA Inhale 2 puffs into the lungs every 6 (six) hours as needed for wheezing or shortness of breath.   aspirin EC 81 MG tablet Take 81 mg by mouth daily. Swallow whole.   BIOTIN BEAUTY EXTRA STRENGTH PO Take by mouth.   D3-1000 PO Take by mouth.   escitalopram 20 MG tablet Commonly known as: LEXAPRO Take 1 tablet (20 mg total) by mouth daily.   famotidine 20 MG tablet Commonly known as: Pepcid One after supper   fluticasone 50 MCG/ACT nasal spray Commonly known as: FLONASE Place 2 sprays into both nostrils in the morning and at bedtime.   levocetirizine 5 MG tablet Commonly known as: XYZAL Take 1 tablet (5 mg total) by mouth every evening.   levothyroxine 88 MCG tablet Commonly known as: Synthroid 1 tab p.o. 6 days a week and a half a tab on Sunday.   metoprolol succinate 25 MG 24 hr tablet Commonly known as: TOPROL-XL Take 0.5-1 tablets (12.5-25 mg total) by mouth daily.   nitroGLYCERIN 0.4 MG SL tablet Commonly known as: NITROSTAT Place 1 tablet (0.4 mg total) under the tongue every 5 (five) minutes as needed for chest pain.   pantoprazole 40 MG tablet Commonly known as: PROTONIX TAKE 1 TABLET(40 MG) BY MOUTH DAILY 30 TO 60 MINUTES BEFORE FIRST MEAL OF THE DAY   rosuvastatin 40 MG tablet Commonly known as: CRESTOR Take 1 tablet (40 mg total) by mouth daily.   traZODone 50 MG tablet Commonly known as: DESYREL Take 0.5-1.5 tablets (25-75 mg total) by mouth at bedtime as needed for sleep. Started by: Howard Pouch, DO        All past medical history, surgical history, allergies, family history, immunizations andmedications were updated in the EMR today and reviewed under the history and medication portions of their EMR.     No results found for this or any previous visit (from the past 2160 hour(s)).    ROS 14 pt review of systems performed and negative (unless mentioned in an HPI)  Objective: BP 111/68   Pulse (!)  51   Temp 98 F (36.7 C)   Wt 194 lb 6.4 oz (88.2 kg)   LMP  (LMP Unknown)   BMI 30.69 kg/m  Physical Exam Vitals and nursing note reviewed.  Constitutional:      General: She is not in acute distress.    Appearance: Normal appearance. She is not ill-appearing, toxic-appearing or diaphoretic.  HENT:     Head: Normocephalic and atraumatic.  Eyes:     General: No scleral icterus.       Right eye: No discharge.        Left eye: No discharge.     Extraocular Movements: Extraocular movements intact.     Conjunctiva/sclera: Conjunctivae normal.     Pupils: Pupils are equal, round, and reactive to light.  Cardiovascular:     Rate and Rhythm: Normal rate and regular rhythm.  Pulmonary:     Effort: Pulmonary effort is normal. No respiratory distress.     Breath sounds: Normal breath sounds. No wheezing, rhonchi or rales.  Musculoskeletal:     Right lower leg: No edema.  Left lower leg: No edema.  Skin:    General: Skin is warm.     Findings: No rash.  Neurological:     Mental Status: She is alert and oriented to person, place, and time. Mental status is at baseline.     Motor: No weakness.     Gait: Gait normal.  Psychiatric:        Mood and Affect: Mood normal.        Behavior: Behavior normal.        Thought Content: Thought content normal.        Judgment: Judgment normal.      No results found.  Assessment/plan: RUFUS CYPERT is a 57 y.o. female present for Chronic Conditions/illness Management Hypothyroidism due to acquired atrophy of thyroid Continue levothyroxine 75 mcg daily.  Labs up-to-date Allergic rhinitis, unspecified seasonality, unspecified trigger Continue Xyzal nightly  Vitamin D deficiency Continue vitamin D supplement.  Labs up-to-date  Hypertension/HLD-LDL goal less than 70/palpitations/CAD/family history of premature CAD Stable Continue metoprolol 1/2-1 tab daily.  Nitro as needed prescribed by cardiology Continue Crestor 40 mg daily   Continue daily baby aspirin Continue follow-ups with cardiology  Obesity/BMI 30.63 Patient has concerns over her weight.  She does not seem to be able to lose weight despite trying and would like further guidance on weight loss. If you are interested in weight loss counseling please make appt to discuss and bring with you 2 weeks of a food diary/log on notebook paper.  Food log is mandatory on first appt to proceed with counseling.  Weight loss counseling encompasses diet, exercise and can include  medications when appropriate and affordable.  There are routine appts for check-ins and weights to track progress and keep you on track.  Routine check-ins (in person) are also mandatory to continue with prescription refills. Check-in timeline  can range from 4 weeks to 12 weeks, depending on physician's recommendations and which step you are in of your weight loss journey.  Your BMI today is Body mass index is 30.69 kg/m.  Please check with your insurance prior to appt and ask them if they cover weight loss medications for your BMI? And if so, which medications. They may tell you some of the diabetes meds that are used for weight loss also,  are on your formulary- but this does not mean they are covered for weight loss only.  Even if they tell with a prior auth it is covered- make sure they check to see if you personally meet criteria with your BMI.   Anxiety/hot flashes/smoking cessation Inadequate coverage. Discontinue Wellbutrin 300 mg daily. Continue lexapro 20 mg qd Start trazodone 25-75 mg tapering nightly.  She was provided proper instructions with this today. Follow-up in 5 and half months sooner if needed.  Return in about 24 weeks (around 04/08/2023) for cpe (20 min), Routine chronic condition follow-up.   No orders of the defined types were placed in this encounter.  Meds ordered this encounter  Medications   escitalopram (LEXAPRO) 20 MG tablet    Sig: Take 1 tablet (20 mg  total) by mouth daily.    Dispense:  90 tablet    Refill:  1   fluticasone (FLONASE) 50 MCG/ACT nasal spray    Sig: Place 2 sprays into both nostrils in the morning and at bedtime.    Dispense:  16 g    Refill:  6   metoprolol succinate (TOPROL-XL) 25 MG 24 hr tablet    Sig: Take 0.5-1  tablets (12.5-25 mg total) by mouth daily.    Dispense:  90 tablet    Refill:  1   levothyroxine (SYNTHROID) 88 MCG tablet    Sig: 1 tab p.o. 6 days a week and a half a tab on Sunday.    Dispense:  90 tablet    Refill:  1   DISCONTD: buPROPion (WELLBUTRIN XL) 300 MG 24 hr tablet    Sig: Take 1 tablet (300 mg total) by mouth daily.    Dispense:  90 tablet    Refill:  1   rosuvastatin (CRESTOR) 40 MG tablet    Sig: Take 1 tablet (40 mg total) by mouth daily.    Dispense:  90 tablet    Refill:  1   DISCONTD: methylPREDNISolone acetate (DEPO-MEDROL) injection 80 mg   traZODone (DESYREL) 50 MG tablet    Sig: Take 0.5-1.5 tablets (25-75 mg total) by mouth at bedtime as needed for sleep.    Dispense:  135 tablet    Refill:  1   Referral Orders  No referral(s) requested today     Electronically signed by: Howard Pouch, [employer]

## 2022-10-22 NOTE — Patient Instructions (Addendum)
Return in about 24 weeks (around 04/08/2023) for cpe (20 min), Routine chronic condition follow-up.        Great to see you today.  I have refilled the medication(s) we provide.   If labs were collected, we will inform you of lab results once received either by echart message or telephone call.   - echart message- for normal results that have been seen by the patient already.   - telephone call: abnormal results or if patient has not viewed results in their echart.  If you are interested in weight loss counseling please make appt to discuss and bring with you 2 weeks of a food diary/log on notebook paper.  Food log is mandatory on first appt to proceed with counseling.  Weight loss counseling encompasses diet, exercise and can include  medications when appropriate and affordable.  There are routine appts for check-ins and weights to track progress and keep you on track.  Routine check-ins (in person) are also mandatory to continue with prescription refills. Check-in timeline  can range from 4 weeks to 12 weeks, depending on physician's recommendations and which step you are in of your weight loss journey.  Your BMI today is Body mass index is 30.69 kg/m.  Please check with your insurance prior to appt and ask them if they cover weight loss medications for your BMI? And if so, which medications. They may tell you some of the diabetes meds that are used for weight loss also,  are on your formulary- but this does not mean they are covered for weight loss only.  Even if they tell with a prior auth it is covered- make sure they check to see if you personally meet criteria with your BMI.

## 2022-10-23 ENCOUNTER — Encounter: Payer: Self-pay | Admitting: Family Medicine

## 2022-11-22 ENCOUNTER — Ambulatory Visit (INDEPENDENT_AMBULATORY_CARE_PROVIDER_SITE_OTHER): Payer: 59

## 2022-11-22 ENCOUNTER — Ambulatory Visit (INDEPENDENT_AMBULATORY_CARE_PROVIDER_SITE_OTHER): Payer: 59 | Admitting: Podiatry

## 2022-11-22 DIAGNOSIS — M79672 Pain in left foot: Secondary | ICD-10-CM

## 2022-11-22 DIAGNOSIS — M76822 Posterior tibial tendinitis, left leg: Secondary | ICD-10-CM

## 2022-11-22 DIAGNOSIS — M2142 Flat foot [pes planus] (acquired), left foot: Secondary | ICD-10-CM | POA: Diagnosis not present

## 2022-11-22 DIAGNOSIS — M62462 Contracture of muscle, left lower leg: Secondary | ICD-10-CM

## 2022-11-27 NOTE — Progress Notes (Signed)
Chief Complaint  Patient presents with   Foot Pain    Left foot and ankle pain,     Subjective:  57 y.o. female presenting today for follow-up evaluation of pain and tenderness to the left foot.  Patient does have a history of excision of an accessory navicular with repair of posterior tibial tendon left foot/ankle October 2022.  Patient states that since the surgery she has had continued pain and tenderness to the left foot and ankle.  She actually had a follow-up MRI of the left ankle on 01/16/2022.    Despite conservative treatment she continues to have pain and tenderness associated to the left foot and ankle.  Unfortunately she has tried multiple conservative modalities including physical therapy, orthotics, and shoe gear modifications with no improvement.  She has pain on a daily basis.  She presents for further treatment and evaluation  Past Medical History:  Diagnosis Date   Allergy    Anxiety    Arthritis    Chronic constipation 01/11/2020   Chronic kidney disease    kidney stones   COPD (chronic obstructive pulmonary disease) (HCC)    DOE (dyspnea on exertion) 01/11/2020   Fainting spell    Family history of premature CAD    Fatigue 2020   multifactoral   GERD (gastroesophageal reflux disease)    History of kidney stones    Hot flashes 02/11/2020   Hyperlipidemia    Hypertension    Hypothyroid    IBS (irritable bowel syndrome)    Kidney stone 08/18/2019   mild nonobstructive CAD on cath 2009    Osteoporosis    Other complicated headache syndrome 12/21/2020   Palpitation    Polyarthralgia 12/08/2018   Smoker    Snoring 09/08/2018   Vitamin D deficiency    Past Surgical History:  Procedure Laterality Date   CORONARY ANGIOGRAM  2009   mild CAD noted after abn GXT   CYSTOSCOPY W/ URETERAL STENT PLACEMENT Right 03/31/2020   Procedure: CYSTOSCOPY WITH STENT PLACEMENT;  Surgeon: Alexis Frock, MD;  Location: [facility];  Service: Urology;  Laterality: Right;   CYSTOSCOPY  WITH RETROGRADE PYELOGRAM, URETEROSCOPY AND STENT PLACEMENT Right 04/21/2020   Procedure: CYSTOSCOPY WITH RETROGRADE PYELOGRAM, RIGHT URETEROSCOPY;  Surgeon: Alexis Frock, MD;  Location: [facility];  Service: Urology;  Laterality: Right;  1 HR   HOLMIUM LASER APPLICATION Right 0000000   Procedure: HOLMIUM LASER APPLICATION;  Surgeon: Alexis Frock, MD;  Location: [facility];  Service: Urology;  Laterality: Right;   WRIST SURGERY     Allergies  Allergen Reactions   Citalopram Palpitations   Ciprofloxacin Nausea And Vomiting   Effexor [Venlafaxine]     nausea         Objective/Physical Exam General: The patient is alert and oriented x3 in no acute distress.  Dermatology: Skin is warm, dry and supple bilateral lower extremities. Negative for open lesions or macerations.  The skin incision to the medial aspect of the left foot has healed nicely  Vascular: Palpable pedal pulses bilaterally. No edema or erythema noted. Capillary refill within normal limits.  Neurological: Light touch and protective threshold grossly intact   Musculoskeletal Exam: Range of motion within normal limits to all pedal and ankle joints bilateral. Muscle strength 5/5 in all groups bilateral.  Upon weightbearing there is a medial longitudinal arch collapse noted to the left foot.  Rearfoot valgus noted to the left lower extremity with excessive pronation upon mid stance.  There is also a component of limited  ankle joint dorsiflexion with tight gastrocnemius equinus noted.  Radiographic Exam LT foot and ankle 11/22/2022:  Normal osseous mineralization. Joint spaces appear to be somewhat preserved. No fracture/dislocation/boney destruction.   Pes planus noted on radiographic exam lateral views. Decreased calcaneal inclination and metatarsal declination angle is noted. Anterior break in the cyma line noted on lateral views. Medial deviation of the talar head  noted on AP radiograph.  There is an anchor within the body of  the medial aspect of the navicular from prior PT tendon repair/accessory navicular exostectomy  MR ANKLE LT WO CONTRAST 01/16/2022: IMPRESSION: 1. Interval resection of accessory navicular bone. 2. Worsening distal tibialis posterior tendinosis with partial-thickness interstitial tearing. No full-thickness or retracted tear. 3. Thickening of the spring ligament, most pronounced at its superomedial component. 4. Pes planovalgus alignment.    Assessment: 1.  Symptomatic pes planus bilateral w/ PTTD 2. PSxHx accessory navicular excision with PT tendon repair LT foot: October 2022 performed by Dr. Milinda Pointer 3.  Equinus with tight posterior complex left lower extremity   Plan of Care:  1. Patient was evaluated. X-Rays and prior MRI reviewed again today with the patient.  2.  The patient is now over 1 year from surgery and she continues to have pain and tenderness with collapse of the medial longitudinal arch of the foot.  This is despite conservative modalities which include arch supports, and physical therapy with no lasting alleviation of her symptoms or improvement. 3.  After discussing with the patient in detail her condition I do believe that revisional surgery is warranted at this time.  Personally I would recommend a triple arthrodesis which would provide more definitive results and prevent recurrence of the collapse of the medial longitudinal arch of the foot.  The procedure was explained in detail with the patient including the risk benefits advantages and disadvantages.  Postoperative recovery course was also explained in detail.  She understands that she would be strictly nonweightbearing for approximately 8 weeks postoperatively.  Patient also understands the risk of surgery specifically triple arthrodesis which would include but not limited to infection, nonunion, malunion, complications from orthopedic hardware.  After discussing with the patient she agrees with that she would like to  proceed with surgery to correct for her foot to address the collapse of the arch of the foot 4.  Authorization for surgery was initiated today.  Surgery will consist of triple arthrodesis left.  Tendo Achilles lengthening left. 5.  Return to clinic 1 week postop   Edrick Kins, DPM [facility]  Dr. Edrick Kins, DPM    [address], [place] [address]                Office [phone]  Fax [phone]

## 2023-01-13 ENCOUNTER — Ambulatory Visit (INDEPENDENT_AMBULATORY_CARE_PROVIDER_SITE_OTHER): Payer: 59 | Admitting: Orthopedic Surgery

## 2023-01-13 ENCOUNTER — Encounter: Payer: Self-pay | Admitting: Orthopedic Surgery

## 2023-01-13 DIAGNOSIS — M76822 Posterior tibial tendinitis, left leg: Secondary | ICD-10-CM | POA: Diagnosis not present

## 2023-01-13 NOTE — Progress Notes (Signed)
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  Patient states she underwent surgery with Dr. Logan Bores with excision of the accessory navicular and debridement posterior tibial tendon in October 2022.  Patient states she recently has been recommended to proceed with a triple arthrodesis and Achilles lengthening.  Patient has trialed orthotics and therapy.  Assessment & Plan: Visit Diagnoses:  1. Insufficiency of left posterior tibial tendon     Plan: Recommended the surgical treatment would be a talonavicular fusion and subtalar fusion we will try to correct some of her hindfoot valgus and correct some of the pronation of the forefoot.  Risks and benefits were discussed including infection neurovascular injury persistent pain nonhealing the bone need for additional surgery.  Discussed that she would be out of work for about 2 months.  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  Patient states she like to proceed as soon as possible.  Follow-Up Instructions: Return in about 2 weeks (around 01/27/2023).   Ortho Exam  Patient is alert, oriented, no adenopathy, well-dressed, normal affect, normal respiratory effort. Examination patient has a good dorsalis pedis pulses she has a fixed hindfoot valgus with no subtalar motion she has pain to palpation of the sinus Tarsi.  She has pronation of the forefoot she cannot do a single limb heel raise she has bony spurs over the talonavicular joint.  Review of the radiographs shows pes planus with pronation of the  forefoot.  There is a retained screw from advancement of the posterior tibial tendon and the navicular.  Imaging: No results found. No images are attached to the encounter.  Labs: Lab Results  Component Value Date   HGBA1C 5.8 (H) 04/12/2022   HGBA1C 5.7 (H) 06/09/2020   HGBA1C 5.7 (H) 12/13/2019   ESRSEDRATE 18 12/08/2018   REPTSTATUS 06/16/2021 FINAL 06/15/2021   CULT (A) 06/15/2021    <10,000 COLONIES/mL INSIGNIFICANT GROWTH Performed at [facility], [address], [place], [place] [address]      Lab Results  Component Value Date   ALBUMIN 4.3 12/20/2020   ALBUMIN 4.2 03/30/2020   ALBUMIN 4.4 03/25/2020    No results found for: "MG" Lab Results  Component Value Date   VD25OH 65 04/12/2022   VD25OH 63 06/09/2020   VD25OH 24.9 (L) 03/03/2020    No results found for: "PREALBUMIN"    Latest Ref Rng & Units 04/12/2022    3:52 PM 06/29/2021    3:34 PM 12/20/2020    9:37 AM  CBC EXTENDED  WBC 3.8 - 10.8 Thousand/uL 6.2  7.3  6.3   RBC 3.80 - 5.10 Million/uL 4.44  4.55  4.39   Hemoglobin 11.7 - 15.5 g/dL 82.9  56.2  13.0   HCT 35.0 - 45.0 % 42.3  44.1  43.2   Platelets 140 - 400 Thousand/uL 249  252  228.0  NEUT# 1,500 - 7,800 cells/uL  3,672  3.7   Lymph# 850 - 3,900 cells/uL  2,876  2.0      There is no height or weight on file to calculate BMI.  Orders:  No orders of the defined types were placed in this encounter.  No orders of the defined types were placed in this encounter.    Procedures: No procedures performed  Clinical Data: No additional findings.  ROS:  All other systems negative, except as noted in the HPI. Review of Systems  Objective: Vital Signs: LMP  (LMP Unknown)   Specialty Comments:  No specialty comments available.  PMFS History: Patient Active Problem List   Diagnosis Date Noted   BMI 30.0-30.9,adult 04/18/2022   Morbid obesity 12/21/2020   Hypertension    Hyperlipidemia LDL goal <70    Depression, major,  single episode, moderate 01/11/2020   COPD (chronic obstructive pulmonary disease) 12/08/2019   Coronary artery disease involving native coronary artery of native heart with angina pectoris 12/08/2019   Irritable bowel syndrome 07/16/2019   Vitamin D deficiency 12/08/2018   Family history of premature CAD 09/08/2018   Cigarette smoker 11/21/2015   Allergic rhinitis 10/29/2007   History of palpitations 04/08/2007   Hypothyroidism 07/01/2006   Anxiety 07/01/2006   Past Medical History:  Diagnosis Date   Allergy    Anxiety    Arthritis    Chronic constipation 01/11/2020   Chronic kidney disease    kidney stones   COPD (chronic obstructive pulmonary disease)    DOE (dyspnea on exertion) 01/11/2020   Fainting spell    Family history of premature CAD    Fatigue 2020   multifactoral   GERD (gastroesophageal reflux disease)    History of kidney stones    Hot flashes 02/11/2020   Hyperlipidemia    Hypertension    Hypothyroid    IBS (irritable bowel syndrome)    Kidney stone 08/18/2019   mild nonobstructive CAD on cath 2009    Osteoporosis    Other complicated headache syndrome 12/21/2020   Palpitation    Polyarthralgia 12/08/2018   Smoker    Snoring 09/08/2018   Vitamin D deficiency     Family History  Problem Relation Age of Onset   Diabetes Mother    Other Mother        carcinoid tumor   Hyperlipidemia Mother    Arthritis Mother    Hypertension Mother    Colon cancer Mother    CAD Father        MI at age 45   Heart disease Father        CABG x 3   Sleep apnea Father    Arrhythmia Father        pacemaker   COPD Father    Diabetes Father    Thyroid disease Father    Hyperlipidemia Father    Arthritis Father    Hypertension Father    Heart attack Father    Colon polyps Father    Crohn's disease Sister    Arthritis Maternal Grandmother    Diabetes Maternal Grandmother    Heart attack Maternal Grandmother    Heart disease Maternal Grandmother    Arthritis  Maternal Grandfather    Heart attack Maternal Grandfather 67       died 42 of MI   Arthritis Paternal Grandmother    Stroke Paternal Grandmother    Arthritis Paternal Grandfather    Heart attack Paternal Grandfather 6  Died of MI   Pulmonary fibrosis Paternal Grandfather    Healthy Daughter    Colon cancer Maternal Aunt    Pulmonary fibrosis Paternal Uncle    Pulmonary fibrosis Paternal Aunt    Esophageal cancer Neg Hx    Stomach cancer Neg Hx    Rectal cancer Neg Hx     Past Surgical History:  Procedure Laterality Date   CORONARY ANGIOGRAM  2009   mild CAD noted after abn GXT   CYSTOSCOPY W/ URETERAL STENT PLACEMENT Right 03/31/2020   Procedure: CYSTOSCOPY WITH STENT PLACEMENT;  Surgeon: Sebastian Ache, MD;  Location: [facility];  Service: Urology;  Laterality: Right;   CYSTOSCOPY WITH RETROGRADE PYELOGRAM, URETEROSCOPY AND STENT PLACEMENT Right 04/21/2020   Procedure: CYSTOSCOPY WITH RETROGRADE PYELOGRAM, RIGHT URETEROSCOPY;  Surgeon: Sebastian Ache, MD;  Location: [facility];  Service: Urology;  Laterality: Right;  1 HR   HOLMIUM LASER APPLICATION Right 04/21/2020   Procedure: HOLMIUM LASER APPLICATION;  Surgeon: Sebastian Ache, MD;  Location: [facility];  Service: Urology;  Laterality: Right;   WRIST SURGERY     Social History   Occupational History   Not on file  Tobacco Use   Smoking status: Every Day    Packs/day: 2.00    Years: 41.00    Additional pack years: 0.00    Total pack years: 82.00    Types: Cigarettes   Smokeless tobacco: Never   Tobacco comments:    Currently 5-7 cigs per day 06/29/21  Vaping Use   Vaping Use: Never used  Substance and Sexual Activity   Alcohol use: No    Alcohol/week: 0.0 standard drinks of alcohol   Drug use: No   Sexual activity: Not Currently    Partners: Male    Birth control/protection: Post-menopausal

## 2023-01-14 ENCOUNTER — Telehealth: Payer: Self-pay

## 2023-01-14 NOTE — Telephone Encounter (Signed)
error 

## 2023-03-24 ENCOUNTER — Ambulatory Visit (INDEPENDENT_AMBULATORY_CARE_PROVIDER_SITE_OTHER): Payer: 59 | Admitting: Family Medicine

## 2023-03-24 ENCOUNTER — Encounter: Payer: Self-pay | Admitting: Family Medicine

## 2023-03-24 VITALS — BP 120/74 | HR 47 | Temp 98.0°F | Wt 194.8 lb

## 2023-03-24 DIAGNOSIS — E063 Autoimmune thyroiditis: Secondary | ICD-10-CM | POA: Diagnosis not present

## 2023-03-24 DIAGNOSIS — E038 Other specified hypothyroidism: Secondary | ICD-10-CM

## 2023-03-24 DIAGNOSIS — Z6830 Body mass index (BMI) 30.0-30.9, adult: Secondary | ICD-10-CM

## 2023-03-24 DIAGNOSIS — R7309 Other abnormal glucose: Secondary | ICD-10-CM | POA: Diagnosis not present

## 2023-03-24 DIAGNOSIS — Z713 Dietary counseling and surveillance: Secondary | ICD-10-CM | POA: Diagnosis not present

## 2023-03-24 LAB — HEMOGLOBIN A1C: Hgb A1c MFr Bld: 6.1 % (ref 4.6–6.5)

## 2023-03-24 LAB — TSH: TSH: 0.36 u[IU]/mL (ref 0.35–5.50)

## 2023-03-24 NOTE — Progress Notes (Deleted)
Mary Escobar , [birth year], 57 y.o., female MRN: [id number] Patient Care Team    Relationship Specialty Notifications Start End  Natalia Leatherwood, DO PCP - General Family Medicine  04/01/22   Loletta Parish., MD Consulting Physician Urology  06/12/20   [facility]    06/12/20   Tanda Rockers, NP Nurse Practitioner Nurse Practitioner  06/12/20    Comment: [facility]  Lewayne Bunting, MD Consulting Physician Cardiology  04/01/22     No chief complaint on file.    Subjective: Mary Escobar is a 57 y.o. Pt presents for an OV with complaints of *** of *** duration.  Associated symptoms include ***.  Pt has tried *** to ease their symptoms.      10/22/2022    3:08 PM 04/12/2022    3:11 PM 07/06/2021    2:09 PM 02/08/2021    8:30 AM 11/20/2020   11:22 AM  Depression screen PHQ 2/9  Decreased Interest 0 0 0 0 0  Down, Depressed, Hopeless 0 0 0 0 0  PHQ - 2 Score 0 0 0 0 0  Altered sleeping  3 1  0  Tired, decreased energy  3 3  0  Change in appetite  0 1  0  Feeling bad or failure about yourself   0 0  0  Trouble concentrating  0 2  0  Moving slowly or fidgety/restless  0 0  0  Suicidal thoughts  0 0  0  PHQ-9 Score  6 7  0    Allergies  Allergen Reactions   Citalopram Palpitations   Ciprofloxacin Nausea And Vomiting   Effexor [Venlafaxine]     nausea   Social History   Social History Narrative   Marital status/children/pets: Single, 1 child.    education/employment: 12th grade education, employed as a Chief Operating Officer.   Safety:      -smoke alarm in the home:Yes     - wears seatbelt: Yes     - Feels safe in their relationships: Yes   Past Medical History:  Diagnosis Date   Allergy    Anxiety    Arthritis    Chronic constipation 01/11/2020   Chronic kidney disease    kidney stones   COPD (chronic obstructive pulmonary disease) (HCC)    DOE (dyspnea on exertion) 01/11/2020   Fainting spell    Family history of  premature CAD    Fatigue 2020   multifactoral   GERD (gastroesophageal reflux disease)    History of kidney stones    Hot flashes 02/11/2020   Hyperlipidemia    Hypertension    Hypothyroid    IBS (irritable bowel syndrome)    Kidney stone 08/18/2019   mild nonobstructive CAD on cath 2009    Osteoporosis    Other complicated headache syndrome 12/21/2020   Palpitation    Polyarthralgia 12/08/2018   Smoker    Snoring 09/08/2018   Vitamin D deficiency    Past Surgical History:  Procedure Laterality Date   CORONARY ANGIOGRAM  2009   mild CAD noted after abn GXT   CYSTOSCOPY W/ URETERAL STENT PLACEMENT Right 03/31/2020   Procedure: CYSTOSCOPY WITH STENT PLACEMENT;  Surgeon: Sebastian Ache, MD;  Location: [facility];  Service: Urology;  Laterality: Right;   CYSTOSCOPY WITH RETROGRADE PYELOGRAM, URETEROSCOPY AND STENT PLACEMENT Right 04/21/2020   Procedure: CYSTOSCOPY WITH RETROGRADE PYELOGRAM, RIGHT URETEROSCOPY;  Surgeon: Sebastian Ache, MD;  Location:  [facility];  Service: Urology;  Laterality: Right;  1 HR   HOLMIUM LASER APPLICATION Right 04/21/2020   Procedure: HOLMIUM LASER APPLICATION;  Surgeon: Sebastian Ache, MD;  Location: [facility];  Service: Urology;  Laterality: Right;   WRIST SURGERY     Family History  Problem Relation Age of Onset   Diabetes Mother    Other Mother        carcinoid tumor   Hyperlipidemia Mother    Arthritis Mother    Hypertension Mother    Colon cancer Mother    CAD Father        MI at age 68   Heart disease Father        CABG x 3   Sleep apnea Father    Arrhythmia Father        pacemaker   COPD Father    Diabetes Father    Thyroid disease Father    Hyperlipidemia Father    Arthritis Father    Hypertension Father    Heart attack Father    Colon polyps Father    Crohn's disease Sister    Arthritis Maternal Grandmother    Diabetes Maternal Grandmother    Heart attack Maternal Grandmother    Heart disease Maternal Grandmother    Arthritis Maternal  Grandfather    Heart attack Maternal Grandfather 38       died 79 of MI   Arthritis Paternal Grandmother    Stroke Paternal Grandmother    Arthritis Paternal Grandfather    Heart attack Paternal Grandfather 59       Died of MI   Pulmonary fibrosis Paternal Grandfather    Healthy Daughter    Colon cancer Maternal Aunt    Pulmonary fibrosis Paternal Uncle    Pulmonary fibrosis Paternal Aunt    Esophageal cancer Neg Hx    Stomach cancer Neg Hx    Rectal cancer Neg Hx    Allergies as of 03/24/2023       Reactions   Citalopram Palpitations   Ciprofloxacin Nausea And Vomiting   Effexor [venlafaxine]    nausea        Medication List        Accurate as of March 24, 2023  7:54 AM. If you have any questions, ask your nurse or doctor.          albuterol 108 (90 Base) MCG/ACT inhaler Commonly known as: VENTOLIN HFA Inhale 2 puffs into the lungs every 6 (six) hours as needed for wheezing or shortness of breath.   aspirin EC 81 MG tablet Take 81 mg by mouth daily. Swallow whole.   BIOTIN BEAUTY EXTRA STRENGTH PO Take by mouth.   D3-1000 PO Take by mouth.   escitalopram 20 MG tablet Commonly known as: LEXAPRO Take 1 tablet (20 mg total) by mouth daily.   famotidine 20 MG tablet Commonly known as: Pepcid One after supper   fluticasone 50 MCG/ACT nasal spray Commonly known as: FLONASE Place 2 sprays into both nostrils in the morning and at bedtime.   levocetirizine 5 MG tablet Commonly known as: XYZAL Take 1 tablet (5 mg total) by mouth every evening.   levothyroxine 88 MCG tablet Commonly known as: Synthroid 1 tab p.o. 6 days a week and a half a tab on Sunday.   metoprolol succinate 25 MG 24 hr tablet Commonly known as: TOPROL-XL Take 0.5-1 tablets (12.5-25 mg total) by mouth daily.   nitroGLYCERIN 0.4 MG SL tablet Commonly known as: NITROSTAT Place 1 tablet (  0.4 mg total) under the tongue every 5 (five) minutes as needed for chest pain.   pantoprazole 40 MG  tablet Commonly known as: PROTONIX TAKE 1 TABLET(40 MG) BY MOUTH DAILY 30 TO 60 MINUTES BEFORE FIRST MEAL OF THE DAY   rosuvastatin 40 MG tablet Commonly known as: CRESTOR Take 1 tablet (40 mg total) by mouth daily.   traZODone 50 MG tablet Commonly known as: DESYREL Take 0.5-1.5 tablets (25-75 mg total) by mouth at bedtime as needed for sleep.        All past medical history, surgical history, allergies, family history, immunizations andmedications were updated in the EMR today and reviewed under the history and medication portions of their EMR.     ROS Negative, with the exception of above mentioned in HPI   Objective:  LMP  (LMP Unknown)  There is no height or weight on file to calculate BMI.  Physical Exam   No results found. No results found. No results found for this or any previous visit (from the past 24 hour(s)).  Assessment/Plan: Mary Escobar is a 57 y.o. female present for OV for  *** Reviewed expectations re: course of current medical issues. Discussed self-management of symptoms. Outlined signs and symptoms indicating need for more acute intervention. Patient verbalized understanding and all questions were answered. Patient received an After-Visit Summary.    No orders of the defined types were placed in this encounter.  No orders of the defined types were placed in this encounter.  Referral Orders  No referral(s) requested today     Note is dictated utilizing voice recognition software. Although note has been proof read prior to signing, occasional typographical errors still can be missed. If any questions arise, please do not hesitate to call for verification.   electronically signed by:  Felix Pacini, DO  [facility]

## 2023-03-24 NOTE — Progress Notes (Signed)
Mary Escobar , 1965-11-20, 57 y.o., female MRN: [id number] Patient Care Team    Relationship Specialty Notifications Start End  Natalia Leatherwood, DO PCP - General Family Medicine  04/01/22   Loletta Parish., MD Consulting Physician Urology  06/12/20   [facility]    06/12/20   Tanda Rockers, NP Nurse Practitioner Nurse Practitioner  06/12/20    Comment: [facility]  Lewayne Bunting, MD Consulting Physician Cardiology  04/01/22     Chief Complaint  Patient presents with   Obesity    Needs to loss weight for surgery; forgot food log     Subjective: Mary Escobar is a 57 y.o. Pt presents for an OV with complaints of weight gain and requesting guidance on weight loss  Wt/lbs: 194 BMI: 30.76 Pt feels since hormone change at age 42, and thyroid disorder diagnosis, she has not been able to lose weight and continues to gain weight.  Diet:skips breakfast, will eat crackers or trail mix, or fruit. Lunch sometimes hummus and pretzels pimento cheese sandwich, yogurt granola, salad Calories:unknown Exercise:no exercise routine bc full throttle at work.  Water:36 ounces.  Barriers: Knee pain and left foot pain.  Foot will eventually require surgery again.      03/24/2023   10:45 AM 10/22/2022    3:08 PM 04/12/2022    3:11 PM 07/06/2021    2:09 PM 02/08/2021    8:30 AM  Depression screen PHQ 2/9  Decreased Interest 0 0 0 0 0  Down, Depressed, Hopeless 0 0 0 0 0  PHQ - 2 Score 0 0 0 0 0  Altered sleeping   3 1   Tired, decreased energy   3 3   Change in appetite   0 1   Feeling bad or failure about yourself    0 0   Trouble concentrating   0 2   Moving slowly or fidgety/restless   0 0   Suicidal thoughts   0 0   PHQ-9 Score   6 7     Allergies  Allergen Reactions   Citalopram Palpitations   Ciprofloxacin Nausea And Vomiting   Effexor [Venlafaxine]     nausea   Social History   Social History Narrative   Marital  status/children/pets: Single, 1 child.    education/employment: 12th grade education, employed as a Chief Operating Officer.   Safety:      -smoke alarm in the home:Yes     - wears seatbelt: Yes     - Feels safe in their relationships: Yes   Past Medical History:  Diagnosis Date   Allergy    Anxiety    Arthritis    Chronic constipation 01/11/2020   Chronic kidney disease    kidney stones   COPD (chronic obstructive pulmonary disease) (HCC)    DOE (dyspnea on exertion) 01/11/2020   Fainting spell    Family history of premature CAD    Fatigue 2020   multifactoral   GERD (gastroesophageal reflux disease)    History of kidney stones    Hot flashes 02/11/2020   Hyperlipidemia    Hypertension    Hypothyroid    IBS (irritable bowel syndrome)    Kidney stone 08/18/2019   mild nonobstructive CAD on cath 2009    Osteoporosis    Other complicated headache syndrome 12/21/2020   Palpitation    Polyarthralgia 12/08/2018   Smoker    Snoring 09/08/2018  Vitamin D deficiency    Past Surgical History:  Procedure Laterality Date   CORONARY ANGIOGRAM  2009   mild CAD noted after abn GXT   CYSTOSCOPY W/ URETERAL STENT PLACEMENT Right 03/31/2020   Procedure: CYSTOSCOPY WITH STENT PLACEMENT;  Surgeon: Sebastian Ache, MD;  Location: [facility];  Service: Urology;  Laterality: Right;   CYSTOSCOPY WITH RETROGRADE PYELOGRAM, URETEROSCOPY AND STENT PLACEMENT Right 04/21/2020   Procedure: CYSTOSCOPY WITH RETROGRADE PYELOGRAM, RIGHT URETEROSCOPY;  Surgeon: Sebastian Ache, MD;  Location: [facility];  Service: Urology;  Laterality: Right;  1 HR   HOLMIUM LASER APPLICATION Right 04/21/2020   Procedure: HOLMIUM LASER APPLICATION;  Surgeon: Sebastian Ache, MD;  Location: [facility];  Service: Urology;  Laterality: Right;   WRIST SURGERY     Family History  Problem Relation Age of Onset   Diabetes Mother    Other Mother        carcinoid tumor   Hyperlipidemia Mother    Arthritis Mother    Hypertension Mother    Colon  cancer Mother    CAD Father        MI at age 44   Heart disease Father        CABG x 3   Sleep apnea Father    Arrhythmia Father        pacemaker   COPD Father    Diabetes Father    Thyroid disease Father    Hyperlipidemia Father    Arthritis Father    Hypertension Father    Heart attack Father    Colon polyps Father    Crohn's disease Sister    Arthritis Maternal Grandmother    Diabetes Maternal Grandmother    Heart attack Maternal Grandmother    Heart disease Maternal Grandmother    Arthritis Maternal Grandfather    Heart attack Maternal Grandfather 51       died 25 of MI   Arthritis Paternal Grandmother    Stroke Paternal Grandmother    Arthritis Paternal Grandfather    Heart attack Paternal Grandfather 90       Died of MI   Pulmonary fibrosis Paternal Grandfather    Healthy Daughter    Colon cancer Maternal Aunt    Pulmonary fibrosis Paternal Uncle    Pulmonary fibrosis Paternal Aunt    Esophageal cancer Neg Hx    Stomach cancer Neg Hx    Rectal cancer Neg Hx    Allergies as of 03/24/2023       Reactions   Citalopram Palpitations   Ciprofloxacin Nausea And Vomiting   Effexor [venlafaxine]    nausea        Medication List        Accurate as of March 24, 2023 10:59 AM. If you have any questions, ask your nurse or doctor.          albuterol 108 (90 Base) MCG/ACT inhaler Commonly known as: VENTOLIN HFA Inhale 2 puffs into the lungs every 6 (six) hours as needed for wheezing or shortness of breath.   aspirin EC 81 MG tablet Take 81 mg by mouth daily. Swallow whole.   BIOTIN BEAUTY EXTRA STRENGTH PO Take by mouth.   D3-1000 PO Take by mouth.   escitalopram 20 MG tablet Commonly known as: LEXAPRO Take 1 tablet (20 mg total) by mouth daily.   famotidine 20 MG tablet Commonly known as: Pepcid One after supper   fluticasone 50 MCG/ACT nasal spray Commonly known as: FLONASE Place 2 sprays into both nostrils in  the morning and at bedtime.    levocetirizine 5 MG tablet Commonly known as: XYZAL Take 1 tablet (5 mg total) by mouth every evening.   levothyroxine 88 MCG tablet Commonly known as: Synthroid 1 tab p.o. 6 days a week and a half a tab on Sunday.   metoprolol succinate 25 MG 24 hr tablet Commonly known as: TOPROL-XL Take 0.5-1 tablets (12.5-25 mg total) by mouth daily.   nitroGLYCERIN 0.4 MG SL tablet Commonly known as: NITROSTAT Place 1 tablet (0.4 mg total) under the tongue every 5 (five) minutes as needed for chest pain.   pantoprazole 40 MG tablet Commonly known as: PROTONIX TAKE 1 TABLET(40 MG) BY MOUTH DAILY 30 TO 60 MINUTES BEFORE FIRST MEAL OF THE DAY   rosuvastatin 40 MG tablet Commonly known as: CRESTOR Take 1 tablet (40 mg total) by mouth daily.   traZODone 50 MG tablet Commonly known as: DESYREL Take 0.5-1.5 tablets (25-75 mg total) by mouth at bedtime as needed for sleep.        All past medical history, surgical history, allergies, family history, immunizations andmedications were updated in the EMR today and reviewed under the history and medication portions of their EMR.     ROS Negative, with the exception of above mentioned in HPI   Objective:  BP 120/74   Pulse (!) 47   Temp 98 F (36.7 C)   Wt 194 lb 12.8 oz (88.4 kg)   LMP  (LMP Unknown)   SpO2 97%   BMI 30.76 kg/m  Body mass index is 30.76 kg/m. Physical Exam Vitals and nursing note reviewed.  Constitutional:      General: She is not in acute distress.    Appearance: Normal appearance. She is normal weight. She is not ill-appearing or toxic-appearing.  HENT:     Head: Normocephalic and atraumatic.  Eyes:     General: No scleral icterus.       Right eye: No discharge.        Left eye: No discharge.     Extraocular Movements: Extraocular movements intact.     Conjunctiva/sclera: Conjunctivae normal.     Pupils: Pupils are equal, round, and reactive to light.  Skin:    Findings: No rash.  Neurological:      Mental Status: She is alert and oriented to person, place, and time. Mental status is at baseline.     Motor: No weakness.     Coordination: Coordination normal.     Gait: Gait normal.  Psychiatric:        Mood and Affect: Mood normal.        Behavior: Behavior normal.        Thought Content: Thought content normal.        Judgment: Judgment normal.     No results found. No results found. No results found for this or any previous visit (from the past 24 hour(s)).  Assessment/Plan: Mary Escobar is a 57 y.o. female present for OV for  Elevated hemoglobin A1c - Hemoglobin A1c Hypothyroidism due to Hashimoto's thyroiditis - TSH Morbid obesity (HCC)/wt loss counseling Patient was counseled on exercise, calorie counting, weight loss and potential medications to help with weight loss today. -Patient was provided with online resources for: Weekly net calorie calculator.  Applications for calorie counting.  Patient was advised to ensure she is taking in adequate nutrition daily by meeting calorie goals. -Patient was educated on dietary changes to not only lose weight but to eat healthy.   -  Patient was educated on glycemic index. -Patient was educated on exercise goal of 150 minutes a week of cardiovascular exercise with goal to obtain optimal heart rate to reach cardiovascular/fat burning zones. -Patient was encouraged to maintain adequate water consumption of at least 80-100 ounces a day. A1c and TSH collected today We discussed trial GLP-1, she would like to start med if possible.  We will discuss in further detail after labs received including follow-up timeline.  Reviewed expectations re: course of current medical issues. Discussed self-management of symptoms. Outlined signs and symptoms indicating need for more acute intervention. Patient verbalized understanding and all questions were answered. Patient received an After-Visit Summary.    No orders of the defined types were  placed in this encounter.  No orders of the defined types were placed in this encounter.  Referral Orders  No referral(s) requested today     Note is dictated utilizing voice recognition software. Although note has been proof read prior to signing, occasional typographical errors still can be missed. If any questions arise, please do not hesitate to call for verification.   electronically signed by:  Felix Pacini, DO  [facility] [place]

## 2023-03-24 NOTE — Patient Instructions (Addendum)
  Return for we will discuss follow up after we get your lab results. Randie Heinz to see you today.   If labs were collected, we will inform you of lab results once received either by echart message or telephone call.   - echart message- for normal results that have been seen by the patient already.   - telephone call: abnormal results or if patient has not viewed results in their echart.  Weekly net calorie calculator.   Applications for calorie counting.   glycemic index. exercise goal of 150 minutes a week (plus warm up and cool down) of cardiovascular exercise.   adequate water consumption of at least 100 ounces a day  Exercise and healthy eating are a must to reach your goals.  You will achieve your goals if you commit to your health 100%. No shortcuts.    - Being and feeling overweight/obese is tough.    - Achieving your healthy body is tough.        - You get to choose your "tough."    You CAN do this.

## 2023-03-25 ENCOUNTER — Telehealth: Payer: Self-pay | Admitting: Family Medicine

## 2023-03-25 DIAGNOSIS — R7303 Prediabetes: Secondary | ICD-10-CM

## 2023-03-25 MED ORDER — WEGOVY 0.25 MG/0.5ML ~~LOC~~ SOAJ: 0.2500 mg | SUBCUTANEOUS | 0 refills | Status: DC

## 2023-03-25 MED ORDER — LEVOTHYROXINE SODIUM 88 MCG PO TABS: ORAL_TABLET | ORAL | 1 refills | Status: AC

## 2023-03-25 NOTE — Telephone Encounter (Signed)
Patient is aware of results and recommendations.

## 2023-03-25 NOTE — Telephone Encounter (Signed)
Please inform patient her thyroid levels are normal. Her A1c is elevated into the prediabetic range at 6.1.  I called in the starter dose for [facility] weekly injections.  This medication will likely need a prior authorization completed.  Although it may be a coverage exclusion, with her history of coronary artery disease we may be able to get this covered under her cardiovascular disease.  If she is approved for this medication, when she picks up med have her make a nurse appointment for proper administration technique tutorial.  She will follow-up 3 weeks after starting medication if approved. If medication is not approved, then we will follow-up in 6 weeks on her weight loss counseling

## 2023-03-31 NOTE — Telephone Encounter (Addendum)
Received response patient is desiring referral to endocrinology Dr. Elvera Lennox why is she desiring a referral to endocrinology, what diagnoses?   -Her thyroid condition does not require endocrinology and diabetes/prediabetes with a daily A1c of 6.1, is not routinely managed by endocrinology.

## 2023-03-31 NOTE — Telephone Encounter (Addendum)
Attempted to contact pt and was unable to LVM 

## 2023-04-04 ENCOUNTER — Encounter: Payer: 59 | Admitting: Family Medicine

## 2023-04-23 ENCOUNTER — Encounter (INDEPENDENT_AMBULATORY_CARE_PROVIDER_SITE_OTHER): Payer: Self-pay

## 2023-05-02 ENCOUNTER — Other Ambulatory Visit: Payer: Self-pay | Admitting: Family Medicine

## 2023-05-08 ENCOUNTER — Encounter: Payer: 59 | Admitting: Family Medicine

## 2023-05-12 ENCOUNTER — Other Ambulatory Visit: Payer: Self-pay | Admitting: Family Medicine

## 2023-05-12 NOTE — Telephone Encounter (Signed)
Please assist with PA.

## 2023-05-14 ENCOUNTER — Telehealth: Payer: Self-pay

## 2023-05-14 ENCOUNTER — Other Ambulatory Visit (HOSPITAL_COMMUNITY): Payer: Self-pay

## 2023-05-14 NOTE — Telephone Encounter (Signed)
PA request has been Submitted. New Encounter created for follow up. For additional info see Pharmacy Prior Auth telephone encounter from 08/21.

## 2023-05-14 NOTE — Telephone Encounter (Signed)
*  Primary  Pharmacy Patient Advocate Encounter   Received notification from RX Request Messages that prior authorization for Wegovy 0.25MG /0.5ML auto-injectors  is required/requested.   Insurance verification completed.   The patient is insured through [facility] .   Per test claim: PA required; PA submitted to [facility] via CoverMyMeds Key/confirmation #/EOC [id number] Status is pending

## 2023-05-21 ENCOUNTER — Other Ambulatory Visit: Payer: Self-pay | Admitting: Family Medicine

## 2023-05-22 NOTE — Telephone Encounter (Signed)
Pharmacy Patient Advocate Encounter  Received notification from [facility] that Prior Authorization for [facility] 0.25MG /0.5ML auto-injectors  has been DENIED.  Full denial letter will be uploaded to the media tab. See denial reason below.   Denial letter attached in patients media

## 2023-05-22 NOTE — Telephone Encounter (Signed)
Please advise if any further suggestions

## 2023-05-23 NOTE — Telephone Encounter (Signed)
Message sent to PCP for further suggestions.

## 2023-05-28 ENCOUNTER — Encounter: Payer: 59 | Admitting: Podiatry

## 2023-06-04 ENCOUNTER — Encounter: Payer: 59 | Admitting: Podiatry

## 2023-06-06 ENCOUNTER — Other Ambulatory Visit: Payer: Self-pay

## 2023-06-06 ENCOUNTER — Telehealth: Payer: Self-pay | Admitting: Family Medicine

## 2023-06-06 MED ORDER — ROSUVASTATIN CALCIUM 40 MG PO TABS: 40.0000 mg | ORAL_TABLET | Freq: Every day | ORAL | 0 refills | Status: DC

## 2023-06-06 NOTE — Telephone Encounter (Signed)
Patient calls to request refill on rosuvastatin (CRESTOR) 40 MG tablet. She is completely out of medication and will be leaving town for a week and I have her scheduled for Oct 4th. Please let the patient know if a refill can be honored.  [facility] [place]

## 2023-06-15 ENCOUNTER — Other Ambulatory Visit: Payer: Self-pay | Admitting: Family Medicine

## 2023-06-18 ENCOUNTER — Encounter: Payer: 59 | Admitting: Podiatry

## 2023-06-24 ENCOUNTER — Other Ambulatory Visit: Payer: Self-pay | Admitting: Family Medicine

## 2023-06-27 ENCOUNTER — Ambulatory Visit: Payer: 59 | Admitting: Family Medicine

## 2023-07-17 ENCOUNTER — Other Ambulatory Visit: Payer: Self-pay

## 2023-07-17 MED ORDER — ROSUVASTATIN CALCIUM 40 MG PO TABS: 40.0000 mg | ORAL_TABLET | Freq: Every day | ORAL | 0 refills | Status: AC

## 2023-07-18 ENCOUNTER — Ambulatory Visit: Payer: 59 | Admitting: Family Medicine

## 2023-07-21 ENCOUNTER — Other Ambulatory Visit: Payer: Self-pay | Admitting: Family Medicine

## 2023-07-23 DIAGNOSIS — M19072 Primary osteoarthritis, left ankle and foot: Secondary | ICD-10-CM | POA: Diagnosis not present

## 2023-07-23 DIAGNOSIS — M76829 Posterior tibial tendinitis, unspecified leg: Secondary | ICD-10-CM | POA: Diagnosis not present

## 2023-07-23 DIAGNOSIS — M79672 Pain in left foot: Secondary | ICD-10-CM | POA: Diagnosis not present

## 2023-08-01 ENCOUNTER — Ambulatory Visit: Payer: 59 | Admitting: Family Medicine

## 2023-08-05 ENCOUNTER — Ambulatory Visit: Payer: 59 | Admitting: Family Medicine

## 2023-08-20 ENCOUNTER — Other Ambulatory Visit: Payer: Self-pay | Admitting: Family Medicine

## 2023-09-03 DIAGNOSIS — M19072 Primary osteoarthritis, left ankle and foot: Secondary | ICD-10-CM | POA: Diagnosis not present

## 2023-09-03 DIAGNOSIS — M76822 Posterior tibial tendinitis, left leg: Secondary | ICD-10-CM | POA: Diagnosis not present

## 2023-09-03 DIAGNOSIS — M21072 Valgus deformity, not elsewhere classified, left ankle: Secondary | ICD-10-CM | POA: Diagnosis not present

## 2023-09-22 DIAGNOSIS — N3001 Acute cystitis with hematuria: Secondary | ICD-10-CM | POA: Diagnosis not present

## 2023-10-02 ENCOUNTER — Other Ambulatory Visit: Payer: Self-pay | Admitting: Family Medicine

## 2024-03-02 ENCOUNTER — Encounter: Payer: Self-pay | Admitting: Dermatology

## 2024-03-02 ENCOUNTER — Ambulatory Visit: Admitting: Dermatology

## 2024-03-02 VITALS — BP 129/77 | HR 50

## 2024-03-02 DIAGNOSIS — L814 Other melanin hyperpigmentation: Secondary | ICD-10-CM

## 2024-03-02 DIAGNOSIS — Z808 Family history of malignant neoplasm of other organs or systems: Secondary | ICD-10-CM

## 2024-03-02 DIAGNOSIS — D1801 Hemangioma of skin and subcutaneous tissue: Secondary | ICD-10-CM

## 2024-03-02 DIAGNOSIS — D229 Melanocytic nevi, unspecified: Secondary | ICD-10-CM

## 2024-03-02 DIAGNOSIS — Z1283 Encounter for screening for malignant neoplasm of skin: Secondary | ICD-10-CM

## 2024-03-02 DIAGNOSIS — W57XXXA Bitten or stung by nonvenomous insect and other nonvenomous arthropods, initial encounter: Secondary | ICD-10-CM | POA: Diagnosis not present

## 2024-03-02 DIAGNOSIS — W908XXA Exposure to other nonionizing radiation, initial encounter: Secondary | ICD-10-CM

## 2024-03-02 DIAGNOSIS — D239 Other benign neoplasm of skin, unspecified: Secondary | ICD-10-CM

## 2024-03-02 DIAGNOSIS — S30861A Insect bite (nonvenomous) of abdominal wall, initial encounter: Secondary | ICD-10-CM | POA: Diagnosis not present

## 2024-03-02 DIAGNOSIS — L578 Other skin changes due to chronic exposure to nonionizing radiation: Secondary | ICD-10-CM | POA: Diagnosis not present

## 2024-03-02 DIAGNOSIS — L821 Other seborrheic keratosis: Secondary | ICD-10-CM

## 2024-03-02 MED ORDER — TRIAMCINOLONE ACETONIDE 0.1 % EX CREA
1.0000 | TOPICAL_CREAM | Freq: Every day | CUTANEOUS | 2 refills | Status: AC | PRN
Start: 2024-03-02 — End: ?

## 2024-03-02 NOTE — Patient Instructions (Signed)

## 2024-03-02 NOTE — Progress Notes (Signed)
   New Patient Visit   Subjective  Mary Escobar is a 58 y.o. female who presents for the following: Skin Cancer Screening and Full Body Skin Exam. No hx of skin cancer. Family hx of skin cancer.  The patient presents for Total-Body Skin Exam (TBSE) for skin cancer screening and mole check. The patient has spots, moles and lesions to be evaluated, some may be new or changing.  The following portions of the chart were reviewed this encounter and updated as appropriate: medications, allergies, medical history  Review of Systems:  No other skin or systemic complaints except as noted in HPI or Assessment and Plan.  Objective  Well appearing patient in no apparent distress; mood and affect are within normal limits.  A full examination was performed including scalp, head, eyes, ears, nose, lips, neck, chest, axillae, abdomen, back, buttocks, bilateral upper extremities, bilateral lower extremities, hands, feet, fingers, toes, fingernails, and toenails. All findings within normal limits unless otherwise noted below.   Relevant physical exam findings are noted in the Assessment and Plan.    Assessment & Plan   SKIN CANCER SCREENING PERFORMED TODAY.  ACTINIC DAMAGE - Chronic condition, secondary to cumulative UV/sun exposure - diffuse scaly erythematous macules with underlying dyspigmentation - Recommend daily broad spectrum sunscreen SPF 30+ to sun-exposed areas, reapply every 2 hours as needed.  - Staying in the shade or wearing long sleeves, sun glasses (UVA+UVB protection) and wide brim hats (4-inch brim around the entire circumference of the hat) are also recommended for sun protection.  - Call for new or changing lesions.  LENTIGINES, SEBORRHEIC KERATOSES, HEMANGIOMAS - Benign normal skin lesions - Benign-appearing - Call for any changes  MELANOCYTIC NEVI - Tan-brown and/or pink-flesh-colored symmetric macules and papules - Benign appearing on exam today - Observation - Call  clinic for new or changing moles - Recommend daily use of broad spectrum spf 30+ sunscreen to sun-exposed areas.   DERMATOFIBROMA Exam: Firm pink/brown papulenodule with dimple sign. Treatment Plan: A dermatofibroma is a benign growth possibly related to trauma, such as an insect bite, cut from shaving, or inflamed acne-type bump.  Treatment options to remove include shave or excision with resulting scar and risk of recurrence.  Since benign-appearing and not bothersome, will observe for now.    Arthropod Bite- Left abdomen - Will Rx triamcinolone  cream PRN BUG BITE, INITIAL ENCOUNTER Left Abdomen (side) - Upper Use triamcinolone  as needed for itching.  Return in about 1 year (around 03/02/2025) for TBSC.  I, Haig Levan, Surg Tech III, am acting as scribe for Deneise Finlay, MD.   Documentation: I have reviewed the above documentation for accuracy and completeness, and I agree with the above.  Deneise Finlay, MD

## 2024-03-29 NOTE — Progress Notes (Deleted)
 New Patient Note  RE: Mary [place] MRN: [id number] DOB: 1966/02/22 Date of Office Visit: 03/30/2024  Consult requested by: Siganporia, Arnaz, FNP Primary care provider: Catherine Charlies LABOR, DO  Chief Complaint: No chief complaint on file.  History of Present Illness: I had the pleasure of seeing Mary [place] for initial evaluation at the [facility] on 03/30/2024. She is [place] 58 y.o. female, who is referred here by Mary Charlies A, DO for the evaluation of environmental allergies.  Discussed the use of AI scribe software for clinical note transcription with the patient, who gave verbal consent to proceed.  History of Present Illness             She reports symptoms of ***. Symptoms have been going on for *** years. The symptoms are present *** all year around with worsening in ***. Other triggers include exposure to ***. Anosmia: ***. Headache: ***. She has used *** with ***fair improvement in symptoms. Sinus infections: ***. Previous work up includes: ***. Previous ENT evaluation: ***. Previous sinus imaging: ***. History of nasal polyps: ***. Last eye exam: ***. History of reflux: ***.  Assessment and Plan: [name] is [place] 58 y.o. female with: ***  Assessment and Plan               No follow-ups on file.  No orders of the defined types were placed in this encounter.  Lab Orders  No laboratory test(s) ordered today    Other allergy screening: Asthma: {Blank single:19197::yes,no} Rhino conjunctivitis: {Blank single:19197::yes,no} Food allergy: {Blank single:19197::yes,no} Medication allergy: {Blank single:19197::yes,no} Hymenoptera allergy: {Blank single:19197::yes,no} Urticaria: {Blank single:19197::yes,no} Eczema:{Blank single:19197::yes,no} History of recurrent infections suggestive of immunodeficency: {Blank single:19197::yes,no}  Diagnostics: Spirometry:  Tracings reviewed. Her effort: {Blank  single:19197::Good reproducible efforts.,It was hard to get consistent efforts and there is Escobar question as to whether this reflects Escobar maximal maneuver.,Poor effort, data can not be interpreted.} FVC: ***L FEV1: ***L, ***% predicted FEV1/FVC ratio: ***% Interpretation: {Blank single:19197::Spirometry consistent with mild obstructive disease,Spirometry consistent with moderate obstructive disease,Spirometry consistent with severe obstructive disease,Spirometry consistent with possible restrictive disease,Spirometry consistent with mixed obstructive and restrictive disease,Spirometry uninterpretable due to technique,Spirometry consistent with normal pattern,No overt abnormalities noted given today's efforts}.  Please see scanned spirometry results for details.  Skin Testing: {Blank single:19197::Select foods,Environmental allergy panel,Environmental allergy panel and select foods,Food allergy panel,None,Deferred due to recent antihistamines use}. *** Results discussed with patient/family.   Past Medical History: Patient Active Problem List   Diagnosis Date Noted  . Prediabetes 03/25/2023  . Weight loss counseling, encounter for 03/24/2023  . BMI 30.0-30.9,adult 04/18/2022  . Morbid obesity (HCC) 12/21/2020  . Hypertension   . Hyperlipidemia LDL goal <70   . Depression, major, single episode, moderate (HCC) 01/11/2020  . COPD (chronic obstructive pulmonary disease) (HCC) 12/08/2019  . Coronary artery disease involving native coronary artery of native heart with angina pectoris (HCC) 12/08/2019  . Irritable bowel syndrome 07/16/2019  . Vitamin D  deficiency 12/08/2018  . Family history of premature CAD 09/08/2018  . Cigarette smoker 11/21/2015  . Allergic rhinitis 10/29/2007  . History of palpitations 04/08/2007  . Hypothyroidism 07/01/2006  . Anxiety 07/01/2006   Past Medical History:  Diagnosis Date  . Allergy   . Anxiety   . Arthritis   . Chronic  constipation 01/11/2020  . Chronic kidney disease    kidney stones  . COPD (chronic obstructive pulmonary disease) (HCC)   . DOE (dyspnea on exertion) 01/11/2020  . Fainting spell   .  Family history of premature CAD   . Fatigue 2020   multifactoral  . GERD (gastroesophageal reflux disease)   . History of kidney stones   . Hot flashes 02/11/2020  . Hyperlipidemia   . Hypertension   . Hypothyroid   . IBS (irritable bowel syndrome)   . Kidney stone 08/18/2019  . mild nonobstructive CAD on cath 2009   . Osteoporosis   . Other complicated headache syndrome 12/21/2020  . Palpitation   . Polyarthralgia 12/08/2018  . Smoker   . Snoring 09/08/2018  . Vitamin D  deficiency    Past Surgical History: Past Surgical History:  Procedure Laterality Date  . CORONARY ANGIOGRAM  2009   mild CAD noted after abn GXT  . CYSTOSCOPY W/ URETERAL STENT PLACEMENT Right 03/31/2020   Procedure: CYSTOSCOPY WITH STENT PLACEMENT;  Surgeon: Alvaro Hummer, MD;  Location: [facility];  Service: Urology;  Laterality: Right;  . CYSTOSCOPY WITH RETROGRADE PYELOGRAM, URETEROSCOPY AND STENT PLACEMENT Right 04/21/2020   Procedure: CYSTOSCOPY WITH RETROGRADE PYELOGRAM, RIGHT URETEROSCOPY;  Surgeon: Alvaro Hummer, MD;  Location: [facility];  Service: Urology;  Laterality: Right;  1 HR  . HOLMIUM LASER APPLICATION Right 04/21/2020   Procedure: HOLMIUM LASER APPLICATION;  Surgeon: Alvaro Hummer, MD;  Location: [facility];  Service: Urology;  Laterality: Right;  . WRIST SURGERY     Medication List:  Current Outpatient Medications  Medication Sig Dispense Refill  . albuterol  (VENTOLIN  HFA) 108 (90 Base) MCG/ACT inhaler Inhale 2 puffs into the lungs every 6 (six) hours as needed for wheezing or shortness of breath.    . aspirin  EC 81 MG tablet Take 81 mg by mouth daily. Swallow whole.    SABRA BIOTIN BEAUTY EXTRA STRENGTH PO Take by mouth.    . Cholecalciferol (D3-1000 PO) Take by mouth.    . escitalopram  (LEXAPRO ) 20 MG tablet TAKE 1  TABLET BY MOUTH EVERY DAY 90 tablet 0  . fluticasone  (FLONASE ) 50 MCG/ACT nasal spray Place 2 sprays into both nostrils in the morning and at bedtime. 16 g 6  . levocetirizine (XYZAL ) 5 MG tablet TAKE 1 TABLET BY MOUTH EVERY DAY IN THE EVENING 30 tablet 11  . levothyroxine  (SYNTHROID ) 88 MCG tablet 1 tab p.o. 6 days [place] week and [place] half [place] tab on Sunday. 90 tablet 1  . loratadine (CLARITIN) 10 MG tablet Take 1 tablet by mouth daily.    . metoprolol  succinate (TOPROL -XL) 25 MG 24 hr tablet TAKE 1/2 TO 1 TABLET (12.5-25 MG TOTAL) BY MOUTH DAILY 30 tablet 5  . nitroGLYCERIN  (NITROSTAT ) 0.4 MG SL tablet Place 1 tablet (0.4 mg total) under the tongue every 5 (five) minutes as needed for chest pain. 25 tablet 4  . rosuvastatin  (CRESTOR ) 40 MG tablet Take 1 tablet (40 mg total) by mouth daily. KEEP SCHEDULED APP. NO FURTHER REFILLS PRIOR TO APPT 30 tablet 0  . rosuvastatin  (CRESTOR ) 40 MG tablet Take 1 tablet by mouth daily.    SABRA topiramate (TOPAMAX) 25 MG tablet Take 25 mg by mouth daily.    . triamcinolone  cream (KENALOG ) 0.1 % Apply 1 Application topically daily as needed. 45 g 2   No current facility-administered medications for this visit.   Allergies: Allergies  Allergen Reactions  . Citalopram Palpitations  . Ciprofloxacin Nausea And Vomiting  . Effexor  [Venlafaxine ]     nausea   Social History: Social History   Socioeconomic History  . Marital status: Divorced    Spouse name: Not on file  . Number of  children: 1  . Years of education: 39  . Highest education level: Not on file  Occupational History  . Not on file  Tobacco Use  . Smoking status: Every Day    Current packs/day: 2.00    Average packs/day: 2.0 packs/day for 41.0 years (82.0 ttl pk-yrs)    Types: Cigarettes  . Smokeless tobacco: Never  . Tobacco comments:    Currently 5-7 cigs per day 06/29/21  Vaping Use  . Vaping status: Never Used  Substance and Sexual Activity  . Alcohol use: No    Alcohol/week: 0.0 standard  drinks of alcohol  . Drug use: No  . Sexual activity: Not Currently    Partners: Male    Birth control/protection: Post-menopausal  Other Topics Concern  . Not on file  Social History Narrative   Marital status/children/pets: Single, 1 child.    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   Safety:      -smoke alarm in the home:Yes     - wears seatbelt: Yes     - Feels safe in their relationships: Yes   Social Drivers of Corporate investment banker Strain: Not on file  Food Insecurity: Low Risk  (02/12/2024)   Received from [facility]   Hunger Vital Sign   . Worried About Programme researcher, broadcasting/film/video in the Last Year: Never true   . Ran Out of Food in the Last Year: Never true  Transportation Needs: No Transportation Needs (02/12/2024)   Received from [facility]   . In the past 12 months, has lack of reliable transportation kept you from medical appointments, meetings, work or from getting things needed for daily living? : No  Physical Activity: Not on file  Stress: Not on file  Social Connections: Not on file   Lives in [place] ***. Smoking: *** Occupation: ***  Environmental HistorySurveyor, minerals in the house: Network engineer in the family room: {Blank single:19197::yes,no} Carpet in the bedroom: {Blank single:19197::yes,no} Heating: {Blank single:19197::electric,gas,heat pump} Cooling: {Blank single:19197::central,window,heat pump} Pet: {Blank single:19197::yes ***,no}  Family History: Family History  Problem Relation Age of Onset  . Diabetes Mother   . Other Mother        carcinoid tumor  . Hyperlipidemia Mother   . Arthritis Mother   . Hypertension Mother   . Colon cancer Mother   . CAD Father        MI at age 47  . Heart disease Father        CABG x 3  . Sleep apnea Father   . Arrhythmia Father        pacemaker  . COPD Father   . Diabetes Father   . Thyroid   disease Father   . Hyperlipidemia Father   . Arthritis Father   . Hypertension Father   . Heart attack Father   . Colon polyps Father   . Crohn's disease Sister   . Arthritis Maternal Grandmother   . Diabetes Maternal Grandmother   . Heart attack Maternal Grandmother   . Heart disease Maternal Grandmother   . Arthritis Maternal Grandfather   . Heart attack Maternal Grandfather 48       died 70 of MI  . Arthritis Paternal Grandmother   . Stroke Paternal Grandmother   . Arthritis Paternal Grandfather   . Heart attack Paternal Grandfather 95       Died of MI  . Pulmonary fibrosis Paternal Grandfather   . Healthy  Daughter   . Colon cancer Maternal Aunt   . Pulmonary fibrosis Paternal Uncle   . Pulmonary fibrosis Paternal Aunt   . Esophageal cancer Neg Hx   . Stomach cancer Neg Hx   . Rectal cancer Neg Hx    Problem                               Relation Asthma                                   *** Eczema                                *** Food allergy                          *** Allergic rhino conjunctivitis     ***  Review of Systems  Constitutional:  Negative for appetite change, chills, fever and unexpected weight change.  HENT:  Negative for congestion and rhinorrhea.   Eyes:  Negative for itching.  Respiratory:  Negative for cough, chest tightness, shortness of breath and wheezing.   Cardiovascular:  Negative for chest pain.  Gastrointestinal:  Negative for abdominal pain.  Genitourinary:  Negative for difficulty urinating.  Skin:  Negative for rash.  Neurological:  Negative for headaches.    Objective: LMP  (LMP Unknown)  There is no height or weight on file to calculate BMI. Physical Exam Vitals and nursing note reviewed.  Constitutional:      Appearance: Normal appearance. She is well-developed.  HENT:     Head: Normocephalic and atraumatic.     Right Ear: Tympanic membrane and external ear normal.     Left Ear: Tympanic membrane and external ear normal.      Nose: Nose normal.     Mouth/Throat:     Mouth: Mucous membranes are moist.     Pharynx: Oropharynx is clear.  Eyes:     Conjunctiva/sclera: Conjunctivae normal.  Cardiovascular:     Rate and Rhythm: Normal rate and regular rhythm.     Heart sounds: Normal heart sounds. No murmur heard.    No friction rub. No gallop.  Pulmonary:     Effort: Pulmonary effort is normal.     Breath sounds: Normal breath sounds. No wheezing, rhonchi or rales.  Musculoskeletal:     Cervical back: Neck supple.  Skin:    General: Skin is warm.     Findings: No rash.  Neurological:     Mental Status: She is alert and oriented to person, place, and time.  Psychiatric:        Behavior: Behavior normal.   The plan was reviewed with the patient/family, and all questions/concerned were addressed.  It was my pleasure to see [facility] today and participate in her care. Please feel free to contact me with any questions or concerns.  Sincerely,  Orlan Cramp, DO Allergy & Immunology  [facility]  [place] office: [phone] [facility] office: [phone]

## 2024-03-30 ENCOUNTER — Ambulatory Visit: Admitting: Allergy

## 2024-04-16 NOTE — Progress Notes (Signed)
 Craig Knoll, FNP  Reason for referral-coronary artery disease  HPI: 58 year old female for evaluation of coronary artery disease at request of Siganporia, Arnaz, FNP.  Patient seen previously but not since April 2022. Cardiac catheterization was performed in 2009 following an abnormal exercise treadmill. She had mild nonobstructive coronary disease. Ejection fraction 65%. Normal right-sided pressures. She did have a brief run of SVT during a right heart catheterization.  Monitor October 2018 showed sinus bradycardia to sinus tachycardia.  Cardiac CTA May 2021 showed calcium  score 156, mild (25 to 49%) LAD.  FFR was negative.  There was also note of 4 cm ectasia of ascending thoracic aorta.  Carotid Dopplers April 2022 showed no hemodynamically significant stenosis bilaterally.  CTA April 2022 showed stable dilatation of ascending aorta at 4 cm.  Echocardiogram May 2022 showed normal LV function.  Patient had dyspnea with more vigorous activities but not routine activities.  No orthopnea, PND, exertional chest pain or syncope.  Occasional brief flutters but no sustained palpitations.  Current Outpatient Medications  Medication Sig Dispense Refill   albuterol  (VENTOLIN  HFA) 108 (90 Base) MCG/ACT inhaler Inhale 2 puffs into the lungs every 6 (six) hours as needed for wheezing or shortness of breath.     aspirin  EC 81 MG tablet Take 81 mg by mouth daily. Swallow whole.     BIOTIN BEAUTY EXTRA STRENGTH PO Take by mouth.     Cholecalciferol (D3-1000 PO) Take by mouth.     escitalopram  (LEXAPRO ) 20 MG tablet TAKE 1 TABLET BY MOUTH EVERY DAY 90 tablet 0   fluticasone  (FLONASE ) 50 MCG/ACT nasal spray Place 2 sprays into both nostrils in the morning and at bedtime. 16 g 6   levocetirizine (XYZAL ) 5 MG tablet TAKE 1 TABLET BY MOUTH EVERY DAY IN THE EVENING 30 tablet 11   levothyroxine  (SYNTHROID ) 88 MCG tablet 1 tab p.o. 6 days a week and a half a tab on Sunday. 90 tablet 1   loratadine  (CLARITIN) 10 MG tablet Take 1 tablet by mouth daily.     metoprolol  succinate (TOPROL -XL) 25 MG 24 hr tablet TAKE 1/2 TO 1 TABLET (12.5-25 MG TOTAL) BY MOUTH DAILY 30 tablet 5   nitroGLYCERIN  (NITROSTAT ) 0.4 MG SL tablet Place 1 tablet (0.4 mg total) under the tongue every 5 (five) minutes as needed for chest pain. 25 tablet 4   rosuvastatin  (CRESTOR ) 40 MG tablet Take 1 tablet (40 mg total) by mouth daily. KEEP SCHEDULED APP. NO FURTHER REFILLS PRIOR TO APPT 30 tablet 0   rosuvastatin  (CRESTOR ) 40 MG tablet Take 1 tablet by mouth daily.     topiramate (TOPAMAX) 25 MG tablet Take 25 mg by mouth daily.     triamcinolone  cream (KENALOG ) 0.1 % Apply 1 Application topically daily as needed. 45 g 2   No current facility-administered medications for this visit.    Allergies  Allergen Reactions   Citalopram Palpitations   Ciprofloxacin Nausea And Vomiting   Effexor  [Venlafaxine ]     nausea     Past Medical History:  Diagnosis Date   Allergy    Anxiety    Arthritis    Chronic constipation 01/11/2020   Chronic kidney disease    kidney stones   COPD (chronic obstructive pulmonary disease) (HCC)    DOE (dyspnea on exertion) 01/11/2020   Fainting spell    Family history of premature CAD    Fatigue 2020   multifactoral   GERD (gastroesophageal reflux disease)    History of  kidney stones    Hot flashes 02/11/2020   Hyperlipidemia    Hypertension    Hypothyroid    IBS (irritable bowel syndrome)    Kidney stone 08/18/2019   mild nonobstructive CAD on cath 2009    Osteoporosis    Other complicated headache syndrome 12/21/2020   Palpitation    Polyarthralgia 12/08/2018   Smoker    Snoring 09/08/2018   Vitamin D  deficiency     Past Surgical History:  Procedure Laterality Date   CORONARY ANGIOGRAM  2009   mild CAD noted after abn GXT   CYSTOSCOPY W/ URETERAL STENT PLACEMENT Right 03/31/2020   Procedure: CYSTOSCOPY WITH STENT PLACEMENT;  Surgeon: Alvaro Hummer, MD;  Location: [facility];   Service: Urology;  Laterality: Right;   CYSTOSCOPY WITH RETROGRADE PYELOGRAM, URETEROSCOPY AND STENT PLACEMENT Right 04/21/2020   Procedure: CYSTOSCOPY WITH RETROGRADE PYELOGRAM, RIGHT URETEROSCOPY;  Surgeon: Alvaro Hummer, MD;  Location: [facility];  Service: Urology;  Laterality: Right;  1 HR   HOLMIUM LASER APPLICATION Right 04/21/2020   Procedure: HOLMIUM LASER APPLICATION;  Surgeon: Alvaro Hummer, MD;  Location: [facility];  Service: Urology;  Laterality: Right;   WRIST SURGERY      Social History   Socioeconomic History   Marital status: Divorced    Spouse name: Not on file   Number of children: 1   Years of education: 85   Highest education level: Not on file  Occupational History   Not on file  Tobacco Use   Smoking status: Every Day    Current packs/day: 2.00    Average packs/day: 2.0 packs/day for 41.0 years (82.0 ttl pk-yrs)    Types: Cigarettes   Smokeless tobacco: Never   Tobacco comments:    Currently 5-7 cigs per day 06/29/21  Vaping Use   Vaping status: Never Used  Substance and Sexual Activity   Alcohol use: No    Alcohol/week: 0.0 standard drinks of alcohol   Drug use: No   Sexual activity: Not Currently    Partners: Male    Birth control/protection: Post-menopausal  Other Topics Concern   Not on file  Social History Narrative   Marital status/children/pets: Single, 1 child.    education/employment: 12th grade education, employed as a Chief Operating Officer.   Safety:      -smoke alarm in the home:Yes     - wears seatbelt: Yes     - Feels safe in their relationships: Yes   Social Drivers of Corporate investment banker Strain: Not on file  Food Insecurity: Low Risk  (04/09/2024)   Received from [facility]   Hunger Vital Sign    Within the past 12 months, you worried that your food would run out before you got money to buy more: Never true    Within the past 12 months, the food you bought just didn't last and you didn't have money to get more. : Never true   Transportation Needs: No Transportation Needs (04/09/2024)   Received from [facility]    In the past 12 months, has lack of reliable transportation kept you from medical appointments, meetings, work or from getting things needed for daily living? : No  Physical Activity: Not on file  Stress: Not on file  Social Connections: Not on file  Intimate Partner Violence: Not on file    Family History  Problem Relation Age of Onset   Diabetes Mother    Other Mother  carcinoid tumor   Hyperlipidemia Mother    Arthritis Mother    Hypertension Mother    Colon cancer Mother    CAD Father        MI at age 87   Heart disease Father        CABG x 3   Sleep apnea Father    Arrhythmia Father        pacemaker   COPD Father    Diabetes Father    Thyroid  disease Father    Hyperlipidemia Father    Arthritis Father    Hypertension Father    Heart attack Father    Colon polyps Father    Crohn's disease Sister    Arthritis Maternal Grandmother    Diabetes Maternal Grandmother    Heart attack Maternal Grandmother    Heart disease Maternal Grandmother    Arthritis Maternal Grandfather    Heart attack Maternal Grandfather 61       died 50 of MI   Arthritis Paternal Grandmother    Stroke Paternal Grandmother    Arthritis Paternal Grandfather    Heart attack Paternal Grandfather 4       Died of MI   Pulmonary fibrosis Paternal Grandfather    Healthy Daughter    Colon cancer Maternal Aunt    Pulmonary fibrosis Paternal Uncle    Pulmonary fibrosis Paternal Aunt    Esophageal cancer Neg Hx    Stomach cancer Neg Hx    Rectal cancer Neg Hx     ROS: Fatigue and ankle pain but no fevers or chills, productive cough, hemoptysis, dysphasia, odynophagia, melena, hematochezia, dysuria, hematuria, rash, seizure activity, orthopnea, PND, pedal edema, claudication. Remaining systems are negative.  Physical Exam:   There were no vitals taken for this visit.  General:   Well developed/well nourished in NAD Skin warm/dry Patient not depressed No peripheral clubbing Back-normal HEENT-normal/normal eyelids Neck supple/normal carotid upstroke bilaterally; no bruits; no JVD; no thyromegaly chest - CTA/ normal expansion CV - RRR/normal S1 and S2; no murmurs, rubs or gallops;  PMI nondisplaced Abdomen -NT/ND, no HSM, no mass, + bowel sounds, no bruit 2+ femoral pulses, no bruits Ext-no edema, chords, 2+ DP Neuro-grossly nonfocal  EKG Interpretation Date/Time:  Friday April 23 2024 11:18:57 EDT Ventricular Rate:  47 PR Interval:  138 QRS Duration:  88 QT Interval:  456 QTC Calculation: 403 R Axis:   9  Text Interpretation: Sinus bradycardia Nonspecific T wave abnormality Confirmed by Pietro Rogue ([id number]) on 04/23/2024 11:20:48 AM    A/P  1 coronary artery disease-mild on previous CTA.  Continue medical therapy with aspirin  and statin.  2 history of ectasia of the ascending thoracic aorta-will arrange follow-up CTA.  3 hyperlipidemia-continue statin.  4 tobacco abuse-patient counseled on discontinuing.  5 history of palpitations-symptoms are improved compared to previous but she does complain of some fatigue and she is mildly bradycardic.  Decrease Toprol  to 12.5 mg daily and follow-up.  Rogue Pietro, MD

## 2024-04-23 ENCOUNTER — Ambulatory Visit: Attending: Cardiology | Admitting: Cardiology

## 2024-04-23 ENCOUNTER — Encounter: Payer: Self-pay | Admitting: Cardiology

## 2024-04-23 VITALS — BP 128/74 | HR 47 | Ht 67.0 in | Wt 196.8 lb

## 2024-04-23 DIAGNOSIS — I7123 Aneurysm of the descending thoracic aorta, without rupture: Secondary | ICD-10-CM

## 2024-04-23 DIAGNOSIS — I25119 Atherosclerotic heart disease of native coronary artery with unspecified angina pectoris: Secondary | ICD-10-CM | POA: Diagnosis not present

## 2024-04-23 DIAGNOSIS — I10 Essential (primary) hypertension: Secondary | ICD-10-CM

## 2024-04-23 NOTE — Patient Instructions (Signed)
 Medication Instructions:   DECREASE METOPROLOL  TO 12.5 MG ONCE DAILY= 1/2 OF THE 25 MG TABLET ONCE DAILY  *If you need a refill on your cardiac medications before your next appointment, please call your pharmacy*  Testing/Procedures:  CTA OF THE CHEST AT THE [address] LOCATION  Follow-Up: At [facility], you and your health needs are our priority.  As part of our continuing mission to provide you with exceptional heart care, our providers are all part of one team.  This team includes your primary Cardiologist (physician) and Advanced Practice Providers or APPs (Physician Assistants and Nurse Practitioners) who all work together to provide you with the care you need, when you need it.  Your next appointment:   12 month(s)  Provider:   REDELL SHALLOW MD

## 2024-04-30 ENCOUNTER — Ambulatory Visit: Admitting: Allergy

## 2024-06-04 ENCOUNTER — Ambulatory Visit: Admitting: Allergy

## 2024-09-29 ENCOUNTER — Telehealth: Payer: Self-pay | Admitting: *Deleted

## 2024-09-29 NOTE — Telephone Encounter (Signed)
 Spoke with pt who was not ready to schedule just yet. She wants to see what her insurance will pay towards the procedure. Gave # to call to help set up procedure when ready. No other questions at this time.

## 2025-03-03 ENCOUNTER — Ambulatory Visit: Admitting: Dermatology
